# Patient Record
Sex: Female | Born: 1935
Health system: Southern US, Community
[De-identification: ages and names within clinical notes are randomized; demographics above are authoritative.]

## PROBLEM LIST (undated history)

## (undated) DIAGNOSIS — E079 Disorder of thyroid, unspecified: Secondary | ICD-10-CM

## (undated) DIAGNOSIS — I10 Essential (primary) hypertension: Secondary | ICD-10-CM

## (undated) DIAGNOSIS — I495 Sick sinus syndrome: Secondary | ICD-10-CM

## (undated) DIAGNOSIS — I509 Heart failure, unspecified: Secondary | ICD-10-CM

## (undated) HISTORY — DX: Sick sinus syndrome: I49.5

---

## 1999-11-26 ENCOUNTER — Emergency Department (HOSPITAL_COMMUNITY): Admission: EM | Admit: 1999-11-26 | Discharge: 1999-11-27 | Payer: Self-pay

## 1999-11-30 ENCOUNTER — Encounter: Payer: Self-pay | Admitting: Emergency Medicine

## 1999-11-30 ENCOUNTER — Emergency Department (HOSPITAL_COMMUNITY): Admission: EM | Admit: 1999-11-30 | Discharge: 1999-11-30 | Payer: Self-pay | Admitting: Emergency Medicine

## 1999-12-06 ENCOUNTER — Emergency Department (HOSPITAL_COMMUNITY): Admission: EM | Admit: 1999-12-06 | Discharge: 1999-12-06 | Payer: Self-pay | Admitting: Emergency Medicine

## 1999-12-06 ENCOUNTER — Encounter: Payer: Self-pay | Admitting: Emergency Medicine

## 1999-12-12 ENCOUNTER — Encounter: Payer: Self-pay | Admitting: Cardiology

## 1999-12-14 ENCOUNTER — Inpatient Hospital Stay (HOSPITAL_COMMUNITY): Admission: EM | Admit: 1999-12-14 | Discharge: 1999-12-19 | Payer: Self-pay | Admitting: Cardiology

## 1999-12-17 ENCOUNTER — Encounter: Payer: Self-pay | Admitting: Cardiology

## 2000-01-17 ENCOUNTER — Emergency Department (HOSPITAL_COMMUNITY): Admission: EM | Admit: 2000-01-17 | Discharge: 2000-01-17 | Payer: Self-pay | Admitting: Emergency Medicine

## 2000-01-23 ENCOUNTER — Inpatient Hospital Stay (HOSPITAL_COMMUNITY): Admission: EM | Admit: 2000-01-23 | Discharge: 2000-01-31 | Payer: Self-pay | Admitting: Emergency Medicine

## 2000-01-23 ENCOUNTER — Encounter: Payer: Self-pay | Admitting: Emergency Medicine

## 2000-01-26 ENCOUNTER — Encounter: Payer: Self-pay | Admitting: Cardiology

## 2000-01-29 ENCOUNTER — Encounter: Payer: Self-pay | Admitting: Cardiology

## 2000-01-30 ENCOUNTER — Encounter: Payer: Self-pay | Admitting: Cardiology

## 2000-06-17 ENCOUNTER — Ambulatory Visit (HOSPITAL_COMMUNITY): Admission: RE | Admit: 2000-06-17 | Discharge: 2000-06-17 | Payer: Self-pay | Admitting: Internal Medicine

## 2000-06-17 ENCOUNTER — Encounter: Payer: Self-pay | Admitting: Internal Medicine

## 2020-04-06 ENCOUNTER — Encounter (HOSPITAL_COMMUNITY): Payer: Self-pay | Admitting: Emergency Medicine

## 2020-04-06 ENCOUNTER — Emergency Department (HOSPITAL_COMMUNITY): Payer: Medicare Other

## 2020-04-06 ENCOUNTER — Other Ambulatory Visit: Payer: Self-pay

## 2020-04-06 ENCOUNTER — Inpatient Hospital Stay (HOSPITAL_COMMUNITY)
Admission: EM | Admit: 2020-04-06 | Discharge: 2020-04-15 | DRG: 291 | Disposition: A | Discharge status: Skilled Nursing Facility | Payer: Medicare Other | Attending: Cardiovascular Disease | Admitting: Cardiovascular Disease

## 2020-04-06 DIAGNOSIS — R001 Bradycardia, unspecified: Secondary | ICD-10-CM | POA: Diagnosis present

## 2020-04-06 DIAGNOSIS — I130 Hypertensive heart and chronic kidney disease with heart failure and stage 1 through stage 4 chronic kidney disease, or unspecified chronic kidney disease: Principal | ICD-10-CM | POA: Diagnosis present

## 2020-04-06 DIAGNOSIS — N179 Acute kidney failure, unspecified: Secondary | ICD-10-CM | POA: Diagnosis present

## 2020-04-06 DIAGNOSIS — I5023 Acute on chronic systolic (congestive) heart failure: Secondary | ICD-10-CM | POA: Diagnosis present

## 2020-04-06 DIAGNOSIS — N1832 Chronic kidney disease, stage 3b: Secondary | ICD-10-CM

## 2020-04-06 DIAGNOSIS — E039 Hypothyroidism, unspecified: Secondary | ICD-10-CM | POA: Diagnosis present

## 2020-04-06 DIAGNOSIS — I495 Sick sinus syndrome: Secondary | ICD-10-CM | POA: Diagnosis present

## 2020-04-06 DIAGNOSIS — H9190 Unspecified hearing loss, unspecified ear: Secondary | ICD-10-CM | POA: Diagnosis present

## 2020-04-06 DIAGNOSIS — I10 Essential (primary) hypertension: Secondary | ICD-10-CM | POA: Diagnosis present

## 2020-04-06 DIAGNOSIS — I472 Ventricular tachycardia: Secondary | ICD-10-CM | POA: Diagnosis present

## 2020-04-06 DIAGNOSIS — Z79899 Other long term (current) drug therapy: Secondary | ICD-10-CM

## 2020-04-06 DIAGNOSIS — F0390 Unspecified dementia without behavioral disturbance: Secondary | ICD-10-CM | POA: Diagnosis present

## 2020-04-06 DIAGNOSIS — N183 Chronic kidney disease, stage 3 unspecified: Secondary | ICD-10-CM | POA: Diagnosis present

## 2020-04-06 DIAGNOSIS — I509 Heart failure, unspecified: Secondary | ICD-10-CM

## 2020-04-06 DIAGNOSIS — E876 Hypokalemia: Secondary | ICD-10-CM

## 2020-04-06 DIAGNOSIS — Z79890 Hormone replacement therapy (postmenopausal): Secondary | ICD-10-CM | POA: Diagnosis not present

## 2020-04-06 DIAGNOSIS — R0602 Shortness of breath: Secondary | ICD-10-CM | POA: Diagnosis present

## 2020-04-06 DIAGNOSIS — Z20822 Contact with and (suspected) exposure to covid-19: Secondary | ICD-10-CM | POA: Diagnosis present

## 2020-04-06 DIAGNOSIS — M7989 Other specified soft tissue disorders: Secondary | ICD-10-CM

## 2020-04-06 HISTORY — DX: Essential (primary) hypertension: I10

## 2020-04-06 HISTORY — DX: Heart failure, unspecified: I50.9

## 2020-04-06 HISTORY — DX: Disorder of thyroid, unspecified: E07.9

## 2020-04-06 LAB — BASIC METABOLIC PANEL
Anion gap: 14 (ref 5–15)
BUN: 30 mg/dL — ABNORMAL HIGH (ref 8–23)
CO2: 23 mmol/L (ref 22–32)
Calcium: 9 mg/dL (ref 8.9–10.3)
Chloride: 104 mmol/L (ref 98–111)
Creatinine, Ser: 1.91 mg/dL — ABNORMAL HIGH (ref 0.44–1.00)
GFR calc Af Amer: 26 mL/min — ABNORMAL LOW (ref >60)
GFR calc non Af Amer: 22 mL/min — ABNORMAL LOW (ref >60)
Glucose, Bld: 110 mg/dL — ABNORMAL HIGH (ref 70–99)
Potassium: 2.9 mmol/L — ABNORMAL LOW (ref 3.5–5.1)
Sodium: 141 mmol/L (ref 135–145)

## 2020-04-06 LAB — MAGNESIUM: Magnesium: 2.6 mg/dL — ABNORMAL HIGH (ref 1.7–2.4)

## 2020-04-06 LAB — TROPONIN I (HIGH SENSITIVITY)
Troponin I (High Sensitivity): 37 ng/L — ABNORMAL HIGH (ref <18)
Troponin I (High Sensitivity): 33 ng/L — ABNORMAL HIGH (ref <18)

## 2020-04-06 LAB — HEPATIC FUNCTION PANEL
ALT: 44 U/L (ref 0–44)
AST: 123 U/L — ABNORMAL HIGH (ref 15–41)
Albumin: 3.8 g/dL (ref 3.5–5.0)
Alkaline Phosphatase: 31 U/L — ABNORMAL LOW (ref 38–126)
Bilirubin, Direct: 0.1 mg/dL (ref 0.0–0.2)
Indirect Bilirubin: 0.6 mg/dL (ref 0.3–0.9)
Total Bilirubin: 0.7 mg/dL (ref 0.3–1.2)
Total Protein: 8.2 g/dL — ABNORMAL HIGH (ref 6.5–8.1)

## 2020-04-06 LAB — CBC
HCT: 33.9 % — ABNORMAL LOW (ref 36.0–46.0)
Hemoglobin: 10.7 g/dL — ABNORMAL LOW (ref 12.0–15.0)
MCH: 29.7 pg (ref 26.0–34.0)
MCHC: 31.6 g/dL (ref 30.0–36.0)
MCV: 94.2 fL (ref 80.0–100.0)
Platelets: 204 10*3/uL (ref 150–400)
RBC: 3.6 MIL/uL — ABNORMAL LOW (ref 3.87–5.11)
RDW: 17.1 % — ABNORMAL HIGH (ref 11.5–15.5)
WBC: 4.6 10*3/uL (ref 4.0–10.5)
nRBC: 0 % (ref 0.0–0.2)

## 2020-04-06 LAB — BRAIN NATRIURETIC PEPTIDE: B Natriuretic Peptide: 70.6 pg/mL (ref 0.0–100.0)

## 2020-04-06 MED ORDER — FUROSEMIDE 10 MG/ML IJ SOLN
20.0000 mg | Freq: Two times a day (BID) | INTRAMUSCULAR | Status: DC
  Administered 2020-04-07 – 2020-04-08 (×3): 20 mg via INTRAVENOUS
  Filled 2020-04-06 (×3): qty 2

## 2020-04-06 MED ORDER — SODIUM CHLORIDE 0.9% FLUSH
3.0000 mL | Freq: Two times a day (BID) | INTRAVENOUS | Status: DC
  Administered 2020-04-06 – 2020-04-15 (×18): 3 mL via INTRAVENOUS

## 2020-04-06 MED ORDER — ENOXAPARIN SODIUM 40 MG/0.4ML ~~LOC~~ SOLN: 40.0000 mg | SUBCUTANEOUS | Status: DC

## 2020-04-06 MED ORDER — AMLODIPINE BESYLATE 10 MG PO TABS
10.0000 mg | ORAL_TABLET | Freq: Every day | ORAL | Status: DC
  Administered 2020-04-07 – 2020-04-08 (×2): 10 mg via ORAL
  Filled 2020-04-06 (×3): qty 1

## 2020-04-06 MED ORDER — POTASSIUM CHLORIDE CRYS ER 20 MEQ PO TBCR
40.0000 meq | EXTENDED_RELEASE_TABLET | Freq: Once | ORAL | Status: AC
  Administered 2020-04-06: 22:00:00 40 meq via ORAL
  Filled 2020-04-06: qty 2

## 2020-04-06 MED ORDER — ACETAMINOPHEN 325 MG PO TABS
650.0000 mg | ORAL_TABLET | ORAL | Status: DC | PRN
  Filled 2020-04-06 (×3): qty 2

## 2020-04-06 MED ORDER — SODIUM CHLORIDE 0.9% FLUSH: 3.0000 mL | INTRAVENOUS | Status: DC | PRN

## 2020-04-06 MED ORDER — SODIUM CHLORIDE 0.9 % IV SOLN: 250.0000 mL | INTRAVENOUS | Status: DC | PRN

## 2020-04-06 MED ORDER — ONDANSETRON HCL 4 MG/2ML IJ SOLN: 4.0000 mg | Freq: Four times a day (QID) | INTRAMUSCULAR | Status: DC | PRN

## 2020-04-06 MED ORDER — HYDRALAZINE HCL 25 MG PO TABS
25.0000 mg | ORAL_TABLET | Freq: Three times a day (TID) | ORAL | Status: DC
  Administered 2020-04-06 – 2020-04-11 (×16): 25 mg via ORAL
  Filled 2020-04-06 (×16): qty 1

## 2020-04-06 MED ORDER — HEPARIN SODIUM (PORCINE) 5000 UNIT/ML IJ SOLN
5000.0000 [IU] | Freq: Three times a day (TID) | INTRAMUSCULAR | Status: DC
  Administered 2020-04-06 – 2020-04-11 (×14): 5000 [IU] via SUBCUTANEOUS
  Filled 2020-04-06 (×13): qty 1

## 2020-04-06 MED ORDER — VITAMIN A 10000 UNITS PO CAPS
10000.0000 [IU] | ORAL_CAPSULE | Freq: Every day | ORAL | Status: DC
  Administered 2020-04-07 – 2020-04-15 (×8): 10000 [IU] via ORAL
  Filled 2020-04-06 (×10): qty 1

## 2020-04-06 MED ORDER — LEVOTHYROXINE SODIUM 100 MCG PO TABS
100.0000 ug | ORAL_TABLET | Freq: Every day | ORAL | Status: DC
  Administered 2020-04-07 – 2020-04-08 (×2): 100 ug via ORAL
  Filled 2020-04-06 (×2): qty 1

## 2020-04-06 MED ORDER — VITAMIN E 15 UNIT/0.3ML PO SOLN
100.0000 [IU] | Freq: Every day | ORAL | Status: DC
  Filled 2020-04-06: qty 2

## 2020-04-06 MED ORDER — FLUTICASONE PROPIONATE 50 MCG/ACT NA SUSP
1.0000 | Freq: Every day | NASAL | Status: DC | PRN
  Filled 2020-04-06: qty 16

## 2020-04-06 MED ORDER — CHOLECALCIFEROL 10 MCG (400 UNIT) PO TABS
400.0000 [IU] | ORAL_TABLET | Freq: Every day | ORAL | Status: DC
  Administered 2020-04-07 – 2020-04-15 (×8): 400 [IU] via ORAL
  Filled 2020-04-06 (×9): qty 1

## 2020-04-06 MED ORDER — POTASSIUM CHLORIDE IN NACL 20-0.9 MEQ/L-% IV SOLN
Freq: Once | INTRAVENOUS | Status: AC
  Filled 2020-04-06: qty 1000

## 2020-04-06 MED ORDER — DICLOFENAC SODIUM 1 % EX GEL
1.0000 "application " | Freq: Every day | CUTANEOUS | Status: DC | PRN
  Administered 2020-04-12 – 2020-04-13 (×2): 1 via TOPICAL
  Filled 2020-04-06 (×2): qty 100

## 2020-04-06 NOTE — ED Provider Notes (Signed)
[facility] EMERGENCY DEPARTMENT Provider Note   CSN: [id number] Arrival date & time: 04/06/20  1159     History Chief Complaint  Patient presents with  . Chest Pain    Latasha Norton is a 86 y.o. female.  HPI Patient presents with concern of lower extremity swelling.  She notes that swelling has been present possibly for a few days, though exact time of onset is unclear.  She specifically denies any pain, any nausea.  Patient is seemingly a reliable historian, but her difficulty with hearing does create some difficulty with obtaining full HPI. Patient states that she is generally well, feels strong.  It seems as though she has had no recent medication changes, and she notes that she takes her blood pressure medication and fluid pills regularly.  Today after describing lower extremity swelling to her physician she was sent here for evaluation.    Past Medical History:  Diagnosis Date  . CHF (congestive heart failure) (HCC)   . Hypertension   . Thyroid disease      History reviewed. No pertinent surgical history.   OB History   No obstetric history on file.      Social history no drinking, no smoking, has multiple children, grandchildren, great-grandchildren and great great-grandchildren. Home Medications Prior to Admission medications   Not on File    Allergies    Patient has no known allergies.  Review of Systems   Review of Systems  Constitutional:       Per HPI, otherwise negative  HENT:       Per HPI, otherwise negative  Respiratory:       Per HPI, otherwise negative  Cardiovascular:       Per HPI, otherwise negative  Gastrointestinal: Negative for vomiting.  Endocrine:       Negative aside from HPI  Genitourinary:       Neg aside from HPI   Musculoskeletal:       Per HPI, otherwise negative  Skin: Negative.   Allergic/Immunologic: Negative for immunocompromised state.  Neurological: Negative for syncope.    Physical  Exam Updated Vital Signs BP (!) 142/61 (BP Location: Right Arm)   Pulse (!) 59   Temp 97.9 F (36.6 C) (Oral)   Resp 16   SpO2 96%   Physical Exam Vitals and nursing note reviewed.  Constitutional:      Appearance: She is well-developed.     Comments: Sprightly elderly female awake and alert interacting appropriately, though with slight limitation secondary to hearing loss.  HENT:     Head: Normocephalic and atraumatic.  Eyes:     Conjunctiva/sclera: Conjunctivae normal.  Cardiovascular:     Rate and Rhythm: Normal rate and regular rhythm.  Pulmonary:     Effort: Pulmonary effort is normal. No respiratory distress.     Breath sounds: Normal breath sounds. No stridor. No decreased breath sounds or wheezing.  Abdominal:     General: There is no distension.  Musculoskeletal:     Right lower leg: Edema present.     Left lower leg: Edema present.  Skin:    General: Skin is warm and dry.  Neurological:     Mental Status: She is alert and oriented to person, place, and time.     Cranial Nerves: No cranial nerve deficit.     ED Results / Procedures / Treatments   Labs (all labs ordered are listed, but only abnormal results are displayed) Labs Reviewed  BASIC METABOLIC PANEL -  Abnormal; Notable for the following components:      Result Value   Potassium 2.9 (*)    Glucose, Bld 110 (*)    BUN 30 (*)    Creatinine, Ser 1.91 (*)    GFR calc non Af Amer 22 (*)    GFR calc Af Amer 26 (*)    All other components within normal limits  CBC - Abnormal; Notable for the following components:   RBC 3.60 (*)    Hemoglobin 10.7 (*)    HCT 33.9 (*)    RDW 17.1 (*)    All other components within normal limits  TROPONIN I (HIGH SENSITIVITY) - Abnormal; Notable for the following components:   Troponin I (High Sensitivity) 37 (*)    All other components within normal limits  HEPATIC FUNCTION PANEL  BRAIN NATRIURETIC PEPTIDE  MAGNESIUM  TROPONIN I (HIGH SENSITIVITY)    EKG EKG  Interpretation  Date/Time:  Wednesday April 06 2020 11:59:52 EDT Ventricular Rate:  72 PR Interval:    QRS Duration: 174 QT Interval:  452 QTC Calculation: 494 R Axis:   -11 Text Interpretation: juntional rhythm Non-specific intra-ventricular conduction block Left ventricular hypertrophy with repolarization abnormality ( Cornell product ) Abnormal ECG Confirmed by Carmin Muskrat [phone]) on 04/06/2020 4:42:02 PM   Radiology DG Chest 2 View  Result Date: 04/06/2020 CLINICAL DATA:  Chest pain, short of breath EXAM: CHEST - 2 VIEW COMPARISON:  None. FINDINGS: Elevation of the LEFT hemidiaphragm. Mediastinum is shifted rightward related to the diaphragmatic elevation. Heart appears enlarged. Ectatic aorta. Mild central venous congestion. No focal infiltrate. No pneumothorax. Degenerate changes of the shoulders. IMPRESSION: 1. Cardiomegaly and central venous congestion. 2. Marked elevation of the LEFT hemidiaphragm Electronically Signed   By: Suzy Bouchard M.D.   On: 04/06/2020 12:53    Procedures Procedures (including critical care time)  Medications Ordered in ED Medications  potassium chloride SA (KLOR-CON) CR tablet 40 mEq (has no administration in time range)  0.9 % NaCl with KCl 20 mEq/ L  infusion (has no administration in time range)    ED Course  I have reviewed the triage vital signs and the nursing notes.  Pertinent labs & imaging results that were available during my care of the patient were reviewed by me and considered in my medical decision making (see chart for details).   Patient presents with swelling.  Patient's initial labs are available on my initial interpretation, and she is found to have hypokalemia. Given the patient description of lower extremity swelling, history of diuretics, antihypertensives, is her suspicion for electrolyte derangement, versus heart failure exacerbation.  Initial findings less consistent with pneumonia or other infection. Patient's initial  EKG concerning for possible block, though junctional rhythm is another possibility.  Patient placed on continuous cardiac monitoring, additional labs ordered, potassium supplement oral and IV as well as fluids ordered as well.  I discussed patient's case with our cardiologist on-call, reviewed EKG together, concerning for wide QRS, sinus rhythm versus junctional escape rhythm, with the with regularity.  We discussed need for admission given the patient's hyperkalemia, as above.  6:23 PM Patient accompanied by a granddaughter and a great granddaughter.  They note the patient is interacting in a typical manner.  We discussed initial findings which are notable for abnormalities as above, with worsening renal function, hypokalemia.  The state that the patient has been doing generally well until developing swelling, speaking with her physician earlier today. Patient is aware of findings thus far.  Gust patient case with her cardiologist/internal medicine team.  With consideration as above including acute kidney injury, hypokalemia, need for ongoing repletion both with fluids and potassium and medication adjustments given her ongoing use of diuretics, the patient will require admission for further monitoring, management. Other initial findings are somewhat reassuring, without overt evidence for ACS, pneumonia, other infectious etiologies.  MDM Rules/Calculators/A&P MDM Number of Diagnoses or Management Options AKI (acute kidney injury) (HCC): new, needed workup Hypokalemia: new, needed workup Swelling of lower extremity: new, needed workup   Amount and/or Complexity of Data Reviewed Clinical lab tests: reviewed Tests in the radiology section of CPT: reviewed Tests in the medicine section of CPT: reviewed Discussion of test results with the performing providers: yes Decide to obtain previous medical records or to obtain history from someone other than the patient: yes Obtain history from someone  other than the patient: yes Review and summarize past medical records: yes Discuss the patient with other providers: yes Independent visualization of images, tracings, or specimens: yes  Risk of Complications, Morbidity, and/or Mortality Presenting problems: high Diagnostic procedures: high Management options: high  Critical Care Total time providing critical care: < 30 minutes  Patient Progress Patient progress: stable   Final Clinical Impression(s) / ED Diagnoses Final diagnoses:  Hypokalemia  AKI (acute kidney injury) (HCC)  Swelling of lower extremity     Gerhard Munch, MD 04/06/20 1829

## 2020-04-06 NOTE — H&P (Signed)
History and Physical   Latasha Norton DPO:[id number] DOB: Jul 22, 1925 DOA: 04/06/2020  Referring MD/NP/PA: Dr. Vanita Panda  PCP: Patient, No Pcp Per   Outpatient Specialists: None  Patient coming from: Home  Chief Complaint: Lower extremity swelling  HPI: Latasha Norton is a 86 y.o. female with medical history significant of hypertension, hypothyroidism, early dementia, previous diagnosis of CHF but no documented EF presenting to the ER by family secondary to progressive lower extremity swelling and some mild shortness of breath with exertion.  Patient denied any chest pain.  Denied any nausea vomiting or diarrhea denied any dizziness.  She has been on amlodipine as well as hydrochlorothiazide with hydralazine.  They do not believe the amlodipine is causing her swelling because she has been on it for too long.  Patient seen and evaluated.  Initial work-up uneventful.  No elevation of troponin and EKG is showing bradycardia otherwise no significant finding.  She is suspected to have acute exacerbation of CHF and she is being admitted to the hospital for work-up and initial treatment..  ED Course: Temperature 98 blood pressure 160/69 pulse 56 respiratory 25 oxygen sat 95%.  White count is 4.6 with hemoglobin 10.7 and platelet count of 204.  Sodium 141 potassium 2.9 chloride 104 CO2 23 BUN 30 and creatinine 1.91.  Magnesium is 2.6 AST 123 with ALT 44.  Troponin is 3733.  BNP of 70.6.  Chest x-ray showed cardiomegaly with central venous congestion with marked elevation of the left hemidiaphragm.  EKG showed junctional rhythm with left ventricular hypertrophy and a rate of 72.  Patient being admitted to the hospital for further evaluation and treatment  Review of Systems: As per HPI otherwise 10 point review of systems negative.    Past Medical History:  Diagnosis Date  . CHF (congestive heart failure) ([place])   . Hypertension   . Thyroid disease     History reviewed. No pertinent surgical  history.   has no history on file for tobacco, alcohol, and drug.  No Known Allergies  History reviewed. No pertinent family history.   Prior to Admission medications   Medication Sig Start Date End Date Taking? Authorizing Provider  amLODipine (NORVASC) 10 MG tablet Take 10 mg by mouth daily. 01/15/20  Yes [provider]  diclofenac Sodium (VOLTAREN) 1 % GEL Apply 1 application topically daily as needed for pain. 04/04/20  Yes [provider]  fluticasone (FLONASE) 50 MCG/ACT nasal spray Place 1 spray into both nostrils daily as needed for congestion. 12/25/19  Yes [provider]  hydrALAZINE (APRESOLINE) 25 MG tablet Take 25 mg by mouth 3 (three) times daily. 01/14/20  Yes [provider]  hydrochlorothiazide (MICROZIDE) 12.5 MG capsule Take 12.5 mg by mouth daily. 02/03/20  Yes [provider]  levothyroxine (SYNTHROID) 100 MCG tablet Take 100 mcg by mouth daily. 04/04/20  Yes [provider]  VITAMIN A PO Take 1 tablet by mouth daily.    Yes [provider]  VITAMIN D PO Take 1 tablet by mouth daily.    Yes [provider]  VITAMIN E PO Take 1 tablet by mouth daily.    Yes [provider]    Physical Exam: Vitals:   04/06/20 1800 04/06/20 1815 04/06/20 1830 04/06/20 1850  BP: (!) 147/68 (!) 151/72 (!) 157/69   Pulse: (!) 56 (!) 58 (!) 58   Resp: 12 (!) 25 19   Temp:      TempSrc:      SpO2: 97%  96% 97%   Weight:    72.6 kg  Height:    5\' 6"  (1.676 m)      Constitutional: Pleasant, mildly confused, no acute distress Vitals:   04/06/20 1800 04/06/20 1815 04/06/20 1830 04/06/20 1850  BP: (!) 147/68 (!) 151/72 (!) 157/69   Pulse: (!) 56 (!) 58 (!) 58   Resp: 12 (!) 25 19   Temp:      TempSrc:      SpO2: 97% 96% 97%   Weight:    72.6 kg  Height:    5\' 6"  (1.676 m)   Eyes: PERRL, lids and conjunctivae normal ENMT: Mucous membranes are moist. Posterior pharynx clear of any exudate or  lesions.edentulous Neck: normal, supple, no masses, no thyromegaly Respiratory: Diffuse bilateral crackles, no significant wheezing. Normal respiratory effort. No accessory muscle use.  Cardiovascular: Sinus bradycardia, no murmurs / rubs / gallops.  1+ edema bilaterally. 2+ pedal pulses. No carotid bruits.  Abdomen: no tenderness, no masses palpated. No hepatosplenomegaly. Bowel sounds positive.  Musculoskeletal: no clubbing / cyanosis. No joint deformity upper and lower extremities. Good ROM, no contractures. Normal muscle tone.  Skin: no rashes, lesions, ulcers. No induration Neurologic: CN 2-12 grossly intact. Sensation intact, DTR normal. Strength 5/5 in all 4.  Psychiatric: Mild confusion alert and oriented x 3. Normal mood.     Labs on Admission: I have personally reviewed following labs and imaging studies  CBC: Recent Labs  Lab 04/06/20 1213  WBC 4.6  HGB 10.7*  HCT 33.9*  MCV 94.2  PLT 204   Basic Metabolic Panel: Recent Labs  Lab 04/06/20 1213 04/06/20 1653  NA 141  --   K 2.9*  --   CL 104  --   CO2 23  --   GLUCOSE 110*  --   BUN 30*  --   CREATININE 1.91*  --   CALCIUM 9.0  --   MG  --  2.6*   GFR: Estimated Creatinine Clearance: 19.2 mL/min (A) (by C-G formula based on SCr of 1.91 mg/dL (H)). Liver Function Tests: Recent Labs  Lab 04/06/20 1653  AST 123*  ALT 44  ALKPHOS 31*  BILITOT 0.7  PROT 8.2*  ALBUMIN 3.8   No results for input(s): LIPASE, AMYLASE in the last 168 hours. No results for input(s): AMMONIA in the last 168 hours. Coagulation Profile: No results for input(s): INR, PROTIME in the last 168 hours. Cardiac Enzymes: No results for input(s): CKTOTAL, CKMB, CKMBINDEX, TROPONINI in the last 168 hours. BNP (last 3 results) No results for input(s): PROBNP in the last 8760 hours. HbA1C: No results for input(s): HGBA1C in the last 72 hours. CBG: No results for input(s): GLUCAP in the last 168 hours. Lipid Profile: No results for  input(s): CHOL, HDL, LDLCALC, TRIG, CHOLHDL, LDLDIRECT in the last 72 hours. Thyroid Function Tests: No results for input(s): TSH, T4TOTAL, FREET4, T3FREE, THYROIDAB in the last 72 hours. Anemia Panel: No results for input(s): VITAMINB12, FOLATE, FERRITIN, TIBC, IRON, RETICCTPCT in the last 72 hours. Urine analysis: No results found for: COLORURINE, APPEARANCEUR, LABSPEC, PHURINE, GLUCOSEU, HGBUR, BILIRUBINUR, KETONESUR, PROTEINUR, UROBILINOGEN, NITRITE, LEUKOCYTESUR Sepsis Labs: @LABRCNTIP (procalcitonin:4,lacticidven:4) )No results found for this or any previous visit (from the past 240 hour(s)).   Radiological Exams on Admission: DG Chest 2 View  Result Date: 04/06/2020 CLINICAL DATA:  Chest pain, short of breath EXAM: CHEST - 2 VIEW COMPARISON:  None. FINDINGS: Elevation of the LEFT hemidiaphragm. Mediastinum is shifted rightward related to the diaphragmatic elevation.  Heart appears enlarged. Ectatic aorta. Mild central venous congestion. No focal infiltrate. No pneumothorax. Degenerate changes of the shoulders. IMPRESSION: 1. Cardiomegaly and central venous congestion. 2. Marked elevation of the LEFT hemidiaphragm Electronically Signed   By: Genevive Bi M.D.   On: 04/06/2020 12:53    EKG: Independently reviewed.  Reviewed as above  Assessment/Plan Principal Problem:   Acute exacerbation of CHF (congestive heart failure) (HCC) Active Problems:   Hypokalemia   Benign essential HTN   Hypothyroid   CKD (chronic kidney disease), stage III   Bradycardia    #1 acute exacerbation of CHF: Patient will be admitted and initiated on IV diuresis.  Hold hydrochlorothiazide.  We will get echocardiogram.  Cardiology consult after echocardiogram.  Monitor closely.  #2 sinus bradycardia: Appears to be transient.  Currently on amlodipine.  Will hold amlodipine both for her edema as well as bradycardia.  Continue hydralazine.  May consider beta-blockers instead.  #3 hypokalemia: Replete  potassium  #4 benign essential hypertension: Continue blood pressure control.  #5 hypothyroidism: Continue levothyroxine  #6 chronic kidney disease stage III: Monitor renal function as we hydrate.    DVT prophylaxis: Lovenox Code Status: Full code Family Communication: Granddaughter and great granddaughter Disposition Plan: Home Consults called: Cardiology Admission status: Inpatient  Severity of Illness: The appropriate patient status for this patient is INPATIENT. Inpatient status is judged to be reasonable and necessary in order to provide the required intensity of service to ensure the patient's safety. The patient's presenting symptoms, physical exam findings, and initial radiographic and laboratory data in the context of their chronic comorbidities is felt to place them at high risk for further clinical deterioration. Furthermore, it is not anticipated that the patient will be medically stable for discharge from the hospital within 2 midnights of admission. The following factors support the patient status of inpatient.   " The patient's presenting symptoms include lower extremity edema with shortness of breath. " The worrisome physical exam findings include bilateral lower extremity edema with bradycardia. " The initial radiographic and laboratory data are worrisome because of bilateral vascular congestion. " The chronic co-morbidities include hypertension CHF.   * I certify that at the point of admission it is my clinical judgment that the patient will require inpatient hospital care spanning beyond 2 midnights from the point of admission due to high intensity of service, high risk for further deterioration and high frequency of surveillance required.Lonia Blood MD [facility] Pager [phone]  If 7PM-7AM, please contact night-coverage [email or web address] Password [facility]  04/06/2020, 7:19 PM

## 2020-04-06 NOTE — ED Triage Notes (Signed)
Patient sent by Dr saying she had fluid around her heart and on her legs. When asked if patient has chest pain she answers "I reckon." Son states patient has not complained of any chest pain but was sent by PCP.

## 2020-04-06 NOTE — Consult Note (Signed)
Referring Physician:  KEIA RASK is an 86 y.o. female.                       Chief Complaint: Shortness of breath  HPI: 86 years old black female with PMH of hypertension, hypothyroidism following h/o hyperthyroidism treated with radioactive I-131 in 05/2000 and CHF has lower extremities swelling and shortness of breath with activity. Her BNP is normal but her potassium is low and creatinine is elevated. Her HS troponin I are minimally elevated. Her chest x-ray is suggestive of vascular congestion and left hemidiaphragm marked elevation. She is maintaining 97 % O2 saturation on 2 L/min of oxygen by [place].   Past Medical History:  Diagnosis Date  . CHF (congestive heart failure) (HCC)   . Hypertension   . Thyroid disease       History reviewed. No pertinent surgical history.  History reviewed. No pertinent family history. Social History:  has no history on file for tobacco, alcohol, and drug.  Allergies: No Known Allergies  (Not in a hospital admission)   Results for orders placed or performed during the hospital encounter of 04/06/20 (from the past 48 hour(s))  Basic metabolic panel     Status: Abnormal   Collection Time: 04/06/20 12:13 PM  Result Value Ref Range   Sodium 141 135 - 145 mmol/L   Potassium 2.9 (L) 3.5 - 5.1 mmol/L   Chloride 104 98 - 111 mmol/L   CO2 23 22 - 32 mmol/L   Glucose, Bld 110 (H) 70 - 99 mg/dL    Comment: Glucose reference range applies only to samples taken after fasting for at least 8 hours.   BUN 30 (H) 8 - 23 mg/dL   Creatinine, Ser 6.96 (H) 0.44 - 1.00 mg/dL   Calcium 9.0 8.9 - 29.5 mg/dL   GFR calc non Af Amer 22 (L) >60 mL/min   GFR calc Af Amer 26 (L) >60 mL/min   Anion gap 14 5 - 15    Comment: Performed at [facility], [address], [place], [place] [address]  CBC     Status: Abnormal   Collection Time: 04/06/20 12:13 PM  Result Value Ref Range   WBC 4.6 4.0 - 10.5 K/uL   RBC 3.60 (L) 3.87 - 5.11 MIL/uL   Hemoglobin 10.7 (L)  12.0 - 15.0 g/dL   HCT 24.4 (L) 01.0 - 27.2 %   MCV 94.2 80.0 - 100.0 fL   MCH 29.7 26.0 - 34.0 pg   MCHC 31.6 30.0 - 36.0 g/dL   RDW 53.6 (H) 64.4 - 03.4 %   Platelets 204 150 - 400 K/uL   nRBC 0.0 0.0 - 0.2 %    Comment: Performed at [facility], [address], [place], [place] [address]  Troponin I (High Sensitivity)     Status: Abnormal   Collection Time: 04/06/20 12:13 PM  Result Value Ref Range   Troponin I (High Sensitivity) 37 (H) <18 ng/L    Comment: (NOTE) Elevated high sensitivity troponin I (hsTnI) values and significant  changes across serial measurements may suggest ACS but many other  chronic and acute conditions are known to elevate hsTnI results.  Refer to the "Links" section for chest pain algorithms and additional  guidance. Performed at [facility], [address] [place], [place] [address]   Hepatic function panel     Status: Abnormal   Collection Time: 04/06/20  4:53 PM  Result  Value Ref Range   Total Protein 8.2 (H) 6.5 - 8.1 g/dL   Albumin 3.8 3.5 - 5.0 g/dL   AST 903 (H) 15 - 41 U/L   ALT 44 0 - 44 U/L   Alkaline Phosphatase 31 (L) 38 - 126 U/L   Total Bilirubin 0.7 0.3 - 1.2 mg/dL   Bilirubin, Direct 0.1 0.0 - 0.2 mg/dL   Indirect Bilirubin 0.6 0.3 - 0.9 mg/dL    Comment: Performed at [facility], [address], [place], [place] [address]  Brain natriuretic peptide     Status: None   Collection Time: 04/06/20  4:53 PM  Result Value Ref Range   B Natriuretic Peptide 70.6 0.0 - 100.0 pg/mL    Comment: Performed at [facility], [address], [place], [place] [address]  Magnesium     Status: Abnormal   Collection Time: 04/06/20  4:53 PM  Result Value Ref Range   Magnesium 2.6 (H) 1.7 - 2.4 mg/dL    Comment: Performed at [facility], [address], [place], [place] [address]  Troponin I (High Sensitivity)     Status: Abnormal   Collection Time: 04/06/20  4:53 PM  Result Value Ref Range   Troponin I (High  Sensitivity) 33 (H) <18 ng/L    Comment: (NOTE) Elevated high sensitivity troponin I (hsTnI) values and significant  changes across serial measurements may suggest ACS but many other  chronic and acute conditions are known to elevate hsTnI results.  Refer to the "Links" section for chest pain algorithms and additional  guidance. Performed at [facility], [address] [place], [place] [address]    DG Chest 2 View  Result Date: 04/06/2020 CLINICAL DATA:  Chest pain, short of breath EXAM: CHEST - 2 VIEW COMPARISON:  None. FINDINGS: Elevation of the LEFT hemidiaphragm. Mediastinum is shifted rightward related to the diaphragmatic elevation. Heart appears enlarged. Ectatic aorta. Mild central venous congestion. No focal infiltrate. No pneumothorax. Degenerate changes of the shoulders. IMPRESSION: 1. Cardiomegaly and central venous congestion. 2. Marked elevation of the LEFT hemidiaphragm Electronically Signed   By: Genevive Bi M.D.   On: 04/06/2020 12:53    Review Of Systems Constitutional: No fever, chills, weight loss or gain. Eyes: No vision change, wears glasses. No discharge or pain. Ears: Positive hearing loss, No tinnitus. Respiratory: No asthma, COPD, pneumonias. Positive shortness of breath. No hemoptysis. Cardiovascular: positive chest pain, palpitation, positive leg edema. Gastrointestinal: No nausea, vomiting, diarrhea, constipation. No GI bleed. No hepatitis. Genitourinary: No dysuria, hematuria, kidney stone. No incontinance. Neurological: No headache, stroke, seizures.  Psychiatry: No psych facility admission for anxiety, depression, suicide. No detox. Skin: No rash. Musculoskeletal: Positive joint pain, fibromyalgia, neck pain, back pain. Lymphadenopathy: No lymphadenopathy. Hematology: No anemia or easy bruising.   Blood pressure (!) 157/69, pulse (!) 58, temperature 97.9 F (36.6 C), temperature source Oral, resp. rate 19, height 5\' 6"  (1.676 m), weight  72.6 kg, SpO2 97 %. Body mass index is 25.82 kg/m. General appearance: alert, cooperative, appears stated age and moderate respiratory distress Head: Normocephalic, atraumatic. Eyes: Brown eyes, Pale pink conjunctiva, corneas clear. PERRL, EOM's intact. Neck: No adenopathy, no carotid bruit, + JVD at 0 degree angle, supple, symmetrical, trachea midline and thyroid not enlarged. Resp: Right sided basal crackles, decreased breath sounds on left lower lob area to auscultation. Cardio: Regular rate and rhythm, S1, S2 normal, II/VI systolic murmur, no click, rub or gallop GI: Soft, non-tender; bowel  sounds normal; no organomegaly. Extremities: 2 + ankle edema, no cyanosis or clubbing. Skin: Warm and dry.  Neurologic: Alert and oriented X 2, normal strength. Normal coordination and slow gait.  Assessment/Plan Acute systolic left heart failure Hypertension Hypothyroidism Hypokalemia  Agree with gentle diuresis. Potassium supplementation. Echocardiogram for LV function. Medical treatment as much as possible.  Time spent: Review of old records, Lab, x-rays, EKG, other cardiac tests, examination, discussion with patient, daughter Pamala Hurry and admitting physician over 70 minutes.  Birdie Riddle, MD  04/06/2020, 7:28 PM

## 2020-04-07 ENCOUNTER — Inpatient Hospital Stay (HOSPITAL_COMMUNITY): Payer: Medicare Other

## 2020-04-07 LAB — LIPID PANEL
Cholesterol: 298 mg/dL — ABNORMAL HIGH (ref 0–200)
HDL: 83 mg/dL (ref >40)
LDL Cholesterol: 197 mg/dL — ABNORMAL HIGH (ref 0–99)
Total CHOL/HDL Ratio: 3.6 RATIO
Triglycerides: 89 mg/dL (ref <150)
VLDL: 18 mg/dL (ref 0–40)

## 2020-04-07 LAB — BASIC METABOLIC PANEL
Anion gap: 10 (ref 5–15)
BUN: 25 mg/dL — ABNORMAL HIGH (ref 8–23)
CO2: 22 mmol/L (ref 22–32)
Calcium: 8.6 mg/dL — ABNORMAL LOW (ref 8.9–10.3)
Chloride: 111 mmol/L (ref 98–111)
Creatinine, Ser: 1.53 mg/dL — ABNORMAL HIGH (ref 0.44–1.00)
GFR calc Af Amer: 34 mL/min — ABNORMAL LOW (ref >60)
GFR calc non Af Amer: 29 mL/min — ABNORMAL LOW (ref >60)
Glucose, Bld: 100 mg/dL — ABNORMAL HIGH (ref 70–99)
Potassium: 3.4 mmol/L — ABNORMAL LOW (ref 3.5–5.1)
Sodium: 143 mmol/L (ref 135–145)

## 2020-04-07 LAB — ECHOCARDIOGRAM COMPLETE
Height: 66 in
Weight: 2144 oz

## 2020-04-07 LAB — TSH: TSH: 137.224 u[IU]/mL — ABNORMAL HIGH (ref 0.350–4.500)

## 2020-04-07 LAB — SARS CORONAVIRUS 2 (TAT 6-24 HRS): SARS Coronavirus 2: NEGATIVE

## 2020-04-07 MED ORDER — ACETAMINOPHEN 325 MG PO TABS
650.0000 mg | ORAL_TABLET | Freq: Every evening | ORAL | Status: DC | PRN
  Administered 2020-04-08 – 2020-04-14 (×5): 650 mg via ORAL
  Filled 2020-04-07 (×2): qty 2

## 2020-04-07 MED ORDER — VITAMIN E 45 MG (100 UNIT) PO CAPS
100.0000 [IU] | ORAL_CAPSULE | Freq: Every day | ORAL | Status: DC
  Administered 2020-04-07 – 2020-04-15 (×9): 100 [IU] via ORAL
  Filled 2020-04-07 (×9): qty 1

## 2020-04-07 MED ORDER — DIPHENHYDRAMINE HCL 25 MG PO CAPS
25.0000 mg | ORAL_CAPSULE | Freq: Every evening | ORAL | Status: DC | PRN
  Administered 2020-04-08 – 2020-04-14 (×4): 25 mg via ORAL
  Filled 2020-04-07 (×4): qty 1

## 2020-04-07 MED ORDER — POTASSIUM CHLORIDE CRYS ER 10 MEQ PO TBCR
10.0000 meq | EXTENDED_RELEASE_TABLET | Freq: Three times a day (TID) | ORAL | Status: DC
  Administered 2020-04-07 – 2020-04-09 (×9): 10 meq via ORAL
  Filled 2020-04-07 (×9): qty 1

## 2020-04-07 NOTE — Progress Notes (Signed)
  Echocardiogram 2D Echocardiogram has been performed.  Leta Jungling M 04/07/2020, 10:28 AM

## 2020-04-07 NOTE — Progress Notes (Signed)
Patient had an episode in which HR sustained in the low 30s.  Patient has been SB all night in the 50s, now sustaining in the 40s.  MD has been made aware of this.

## 2020-04-07 NOTE — Progress Notes (Signed)
Ref: Patient, No Pcp Per   Subjective:  Awake. No chest pain. Eating breakfast.  VS stable except junctional rhythm. HR in 40's. BP 143/69. Potassium improving to 3.4 meq from 2.9 meq. Markedly elevated TSH coincides with daughter's report of patient not taking levothyroxine.  Objective:  Vital Signs in the last 24 hours: Temp:  [97.9 F (36.6 C)-98.7 F (37.1 C)] 98.3 F (36.8 C) (04/08 0507) Pulse Rate:  [42-71] 42 (04/08 0507) Cardiac Rhythm: Junctional rhythm;Bundle branch block (04/08 0700) Resp:  [12-25] 23 (04/08 0907) BP: (126-167)/(58-72) 143/69 (04/08 0907) SpO2:  [95 %-100 %] 100 % (04/08 0507) Weight:  [60.8 kg-72.6 kg] 60.8 kg (04/08 0507)  Physical Exam: BP Readings from Last 1 Encounters:  04/07/20 (!) 143/69     Wt Readings from Last 1 Encounters:  04/07/20 60.8 kg    Weight change:  Body mass index is 21.63 kg/m. HEENT: [place]/AT, Eyes-Brown, PERL, EOMI, Conjunctiva-Pink, Sclera-Non-icteric Neck: No JVD, No bruit, Trachea midline. Lungs:  Clearing, Bilateral. Cardiac:  Regular rhythm, normal S1 and S2, no S3. II/VI systolic murmur. Abdomen:  Soft, non-tender. BS present. Extremities:  1 + ankle edema present. No cyanosis. No clubbing. CNS: AxOx2, Cranial nerves grossly intact, moves all 4 extremities.  Skin: Warm and dry.   Intake/Output from previous day: 04/07 0701 - 04/08 0700 In: 483 [P.O.:480; I.V.:3] Out: 600 [Urine:600]    Lab Results: BMET    Component Value Date/Time   NA 143 04/07/2020 0502   NA 141 04/06/2020 1213   K 3.4 (L) 04/07/2020 0502   K 2.9 (L) 04/06/2020 1213   CL 111 04/07/2020 0502   CL 104 04/06/2020 1213   CO2 22 04/07/2020 0502   CO2 23 04/06/2020 1213   GLUCOSE 100 (H) 04/07/2020 0502   GLUCOSE 110 (H) 04/06/2020 1213   BUN 25 (H) 04/07/2020 0502   BUN 30 (H) 04/06/2020 1213   CREATININE 1.53 (H) 04/07/2020 0502   CREATININE 1.91 (H) 04/06/2020 1213   CALCIUM 8.6 (L) 04/07/2020 0502   CALCIUM 9.0 04/06/2020 1213    GFRNONAA 29 (L) 04/07/2020 0502   GFRNONAA 22 (L) 04/06/2020 1213   GFRAA 34 (L) 04/07/2020 0502   GFRAA 26 (L) 04/06/2020 1213   CBC    Component Value Date/Time   WBC 4.6 04/06/2020 1213   RBC 3.60 (L) 04/06/2020 1213   HGB 10.7 (L) 04/06/2020 1213   HCT 33.9 (L) 04/06/2020 1213   PLT 204 04/06/2020 1213   MCV 94.2 04/06/2020 1213   MCH 29.7 04/06/2020 1213   MCHC 31.6 04/06/2020 1213   RDW 17.1 (H) 04/06/2020 1213   HEPATIC Function Panel Recent Labs    04/06/20 1653  PROT 8.2*   HEMOGLOBIN A1C No components found for: HGA1C,  MPG CARDIAC ENZYMES No results found for: CKTOTAL, CKMB, CKMBINDEX, TROPONINI BNP No results for input(s): PROBNP in the last 8760 hours. TSH Recent Labs    04/07/20 0502  TSH 137.224*   CHOLESTEROL Recent Labs    04/07/20 0502  CHOL 298*    Scheduled Meds: . amLODipine  10 mg Oral Daily  . cholecalciferol  400 Units Oral Daily  . furosemide  20 mg Intravenous BID  . heparin injection (subcutaneous)  5,000 Units Subcutaneous Q8H  . hydrALAZINE  25 mg Oral TID  . levothyroxine  100 mcg Oral Daily  . potassium chloride  10 mEq Oral TID  . sodium chloride flush  3 mL Intravenous Q12H  . vitamin A  10,000 Units Oral  Daily  . vitamin E  100 Units Oral Daily   Continuous Infusions: . sodium chloride     PRN Meds:.sodium chloride, acetaminophen, diclofenac Sodium, fluticasone, ondansetron (ZOFRAN) IV, sodium chloride flush  Assessment/Plan: Acute systolic left heart failure Hypertension Hypothyroidism Hypokalemia  Levothyroxine has been resumed. Echocardiogram is pending. Heart failure may have been worsened by hypothyroidysm and badycardia Will recheck TSH in one month to adjust dose. Dr. Sharyn Lull covering over the weekend.   LOS: 1 day   Time spent including chart review, lab review, examination, discussion with patient : 30 min   Orpah Cobb  MD  04/07/2020, 9:22 AM

## 2020-04-08 ENCOUNTER — Encounter (HOSPITAL_COMMUNITY): Payer: Self-pay | Admitting: Internal Medicine

## 2020-04-08 LAB — BASIC METABOLIC PANEL
Anion gap: 11 (ref 5–15)
BUN: 22 mg/dL (ref 8–23)
CO2: 25 mmol/L (ref 22–32)
Calcium: 8.5 mg/dL — ABNORMAL LOW (ref 8.9–10.3)
Chloride: 106 mmol/L (ref 98–111)
Creatinine, Ser: 1.59 mg/dL — ABNORMAL HIGH (ref 0.44–1.00)
GFR calc Af Amer: 32 mL/min — ABNORMAL LOW (ref >60)
GFR calc non Af Amer: 28 mL/min — ABNORMAL LOW (ref >60)
Glucose, Bld: 81 mg/dL (ref 70–99)
Potassium: 3.5 mmol/L (ref 3.5–5.1)
Sodium: 142 mmol/L (ref 135–145)

## 2020-04-08 MED ORDER — FUROSEMIDE 20 MG PO TABS
20.0000 mg | ORAL_TABLET | Freq: Every day | ORAL | Status: DC
  Administered 2020-04-09 – 2020-04-15 (×7): 20 mg via ORAL
  Filled 2020-04-08 (×7): qty 1

## 2020-04-08 MED ORDER — LEVOTHYROXINE SODIUM 25 MCG PO TABS
125.0000 ug | ORAL_TABLET | Freq: Every day | ORAL | Status: DC
  Administered 2020-04-09 – 2020-04-12 (×4): 125 ug via ORAL
  Filled 2020-04-08 (×4): qty 1

## 2020-04-08 NOTE — Progress Notes (Signed)
Pt having intermittent runs of non-sustained v-tach, Dr Algie Coffer notified, will continue to monitor closely.

## 2020-04-08 NOTE — Progress Notes (Signed)
Subjective:  Denies any chest pain or shortness of breath.  Still has marked sinus bradycardia on the monitor.  Objective:  Vital Signs in the last 24 hours: Temp:  [98.1 F (36.7 C)-98.6 F (37 C)] 98.1 F (36.7 C) (04/09 0004) Pulse Rate:  [45-63] 45 (04/09 0004) Resp:  [18] 18 (04/08 1727) BP: (121-144)/(52-96) 144/53 (04/09 0004) SpO2:  [92 %-98 %] 98 % (04/09 0004) Weight:  [60.8 kg] 60.8 kg (04/09 0619)  Intake/Output from previous day: 04/08 0701 - 04/09 0700 In: 560 [P.O.:560] Out: 600 [Urine:600] Intake/Output from this shift: No intake/output data recorded.  Physical Exam: Neck: no adenopathy, no carotid bruit, no JVD and supple, symmetrical, trachea midline Lungs: clear to auscultation bilaterally Heart: bradycardic.  Soft systolic murmur and paradoxical splitting of second heart sound noted Abdomen: soft, non-tender; bowel sounds normal; no masses,  no organomegaly Extremities: no clubbing, cyanosis.  Trace edema noted  Lab Results: Recent Labs    04/06/20 1213  WBC 4.6  HGB 10.7*  PLT 204   Recent Labs    04/07/20 0502 04/08/20 0342  NA 143 142  K 3.4* 3.5  CL 111 106  CO2 22 25  GLUCOSE 100* 81  BUN 25* 22  CREATININE 1.53* 1.59*   No results for input(s): TROPONINI in the last 72 hours.  Invalid input(s): CK, MB Hepatic Function Panel Recent Labs    04/06/20 1653  PROT 8.2*  ALBUMIN 3.8  AST 123*  ALT 44  ALKPHOS 31*  BILITOT 0.7  BILIDIR 0.1  IBILI 0.6   Recent Labs    04/07/20 0502  CHOL 298*   No results for input(s): PROTIME in the last 72 hours.  Imaging: Imaging results have been reviewed and DG Chest 2 View  Result Date: 04/06/2020 CLINICAL DATA:  Chest pain, short of breath EXAM: CHEST - 2 VIEW COMPARISON:  None. FINDINGS: Elevation of the LEFT hemidiaphragm. Mediastinum is shifted rightward related to the diaphragmatic elevation. Heart appears enlarged. Ectatic aorta. Mild central venous congestion. No focal infiltrate.  No pneumothorax. Degenerate changes of the shoulders. IMPRESSION: 1. Cardiomegaly and central venous congestion. 2. Marked elevation of the LEFT hemidiaphragm Electronically Signed   By: Genevive Bi M.D.   On: 04/06/2020 12:53   ECHOCARDIOGRAM COMPLETE  Result Date: 04/07/2020    ECHOCARDIOGRAM REPORT   Patient Name:   Latasha Norton Date of Exam: 04/07/2020 Medical Rec #:  [id number]      Height:       66.0 in Accession #:    [id number]     Weight:       134.0 lb Date of Birth:  08-26-1925      BSA:          1.687 m Patient Age:    86 years       BP:           143/69 mmHg Patient Gender: F              HR:           58 bpm. Exam Location:  Inpatient Procedure: 2D Echo Indications:     CHF-Acute Diastolic 428.31 / I50.31  History:         Patient has no prior history of Echocardiogram examinations.                  Signs/Symptoms:dementia; Risk Factors:Hypertension.                  Hypothyroidism.  Sonographer:     Darlina Sicilian RDCS Referring Phys:  Ong Diagnosing Phys: Dixie Dials MD IMPRESSIONS  1. Left ventricular ejection fraction, by estimation, is 45 to 50%. The left ventricle has mildly decreased function. The left ventricle demonstrates regional wall motion abnormalities (see scoring diagram/findings for description). There is mild concentric left ventricular hypertrophy of the anterior and posterior segments. Left ventricular diastolic parameters are indeterminate. There is mild hypokinesis of the left ventricular, entire septal wall.  2. Right ventricular systolic function is normal. The right ventricular size is normal. There is normal pulmonary artery systolic pressure.  3. Left atrial size was mildly dilated.  4. Right atrial size was mildly dilated.  5. The mitral valve is degenerative. Mild mitral valve regurgitation.  6. The aortic valve is tricuspid. Aortic valve regurgitation is mild. Mild to moderate aortic valve stenosis.  7. There is mild (Grade II) atheroma plaque  involving the aortic root and ascending aorta.  8. The inferior vena cava is normal in size with greater than 50% respiratory variability, suggesting right atrial pressure of 3 mmHg. FINDINGS  Left Ventricle: Left ventricular ejection fraction, by estimation, is 45 to 50%. The left ventricle has mildly decreased function. The left ventricle demonstrates regional wall motion abnormalities. Mild hypokinesis of the left ventricular, entire septal wall. The left ventricular internal cavity size was normal in size. There is mild concentric left ventricular hypertrophy of the anterior and posterior segments. Left ventricular diastolic parameters are indeterminate.  LV Wall Scoring: The entire septum is hypokinetic. The entire anterior wall, entire lateral wall, entire inferior wall, and apex are normal. Right Ventricle: The right ventricular size is normal. No increase in right ventricular wall thickness. Right ventricular systolic function is normal. There is normal pulmonary artery systolic pressure. The tricuspid regurgitant velocity is 2.04 m/s, and  with an assumed right atrial pressure of 3 mmHg, the estimated right ventricular systolic pressure is 79.8 mmHg. Left Atrium: Left atrial size was mildly dilated. Right Atrium: Right atrial size was mildly dilated. Pericardium: There is no evidence of pericardial effusion. Mitral Valve: The mitral valve is degenerative in appearance. There is mild thickening of the mitral valve leaflet(s). There is moderate calcification of the mitral valve leaflet(s). Mild to moderate mitral annular calcification. Mild mitral valve regurgitation. Tricuspid Valve: The tricuspid valve is normal in structure. Tricuspid valve regurgitation is mild. Aortic Valve: The aortic valve is tricuspid. . There is mild thickening and mild calcification of the aortic valve. Aortic valve regurgitation is mild. Aortic regurgitation PHT measures 789 msec. Mild to moderate aortic stenosis is present. Mild  aortic valve annular calcification. There is mild thickening of the aortic valve. There is mild calcification of the aortic valve. Pulmonic Valve: The pulmonic valve was normal in structure. Pulmonic valve regurgitation is trivial. Aorta: The aortic root is normal in size and structure. There is mild (Grade II) atheroma plaque involving the aortic root and ascending aorta. Venous: The inferior vena cava is normal in size with greater than 50% respiratory variability, suggesting right atrial pressure of 3 mmHg. IAS/Shunts: No atrial level shunt detected by color flow Doppler.  LEFT VENTRICLE PLAX 2D LVIDd:         3.60 cm  Diastology LVIDs:         3.10 cm  LV e' lateral:   4.39 cm/s LV PW:         1.40 cm  LV E/e' lateral: 11.5 LV IVS:  1.50 cm  LV e' medial:    4.28 cm/s LVOT diam:     1.80 cm  LV E/e' medial:  11.8 LV SV:         48 LV SV Index:   29 LVOT Area:     2.54 cm  LEFT ATRIUM         Index LA diam:    3.30 cm 1.96 cm/m  AORTIC VALVE LVOT Vmax:   80.90 cm/s LVOT Vmean:  54.700 cm/s LVOT VTI:    0.189 m AI PHT:      789 msec  AORTA Ao Asc diam: 2.90 cm MITRAL VALVE               TRICUSPID VALVE MV Area (PHT): 3.51 cm    TR Peak grad:   16.6 mmHg MV Decel Time: 216 msec    TR Vmax:        204.00 cm/s MV E velocity: 50.35 cm/s MV A velocity: 28.70 cm/s  SHUNTS MV E/A ratio:  1.75        Systemic VTI:  0.19 m                            Systemic Diam: 1.80 cm Orpah Cobb MD Electronically signed by Orpah Cobb MD Signature Date/Time: 04/07/2020/12:24:40 PM    Final     Cardiac Studies:  Assessment/Plan:  Compensated systolic congestive heart failure. Hypertension. Hypothyroidism. Marked sinus bradycardia secondary to above. Status post hypokalemia. Plan Increase Synthroid as per orders Increase ambulation as tolerated.   LOS: 2 days    Rinaldo Cloud 04/08/2020, 11:20 AM

## 2020-04-09 LAB — BASIC METABOLIC PANEL
Anion gap: 10 (ref 5–15)
BUN: 26 mg/dL — ABNORMAL HIGH (ref 8–23)
CO2: 26 mmol/L (ref 22–32)
Calcium: 8.8 mg/dL — ABNORMAL LOW (ref 8.9–10.3)
Chloride: 105 mmol/L (ref 98–111)
Creatinine, Ser: 1.77 mg/dL — ABNORMAL HIGH (ref 0.44–1.00)
GFR calc Af Amer: 28 mL/min — ABNORMAL LOW (ref >60)
GFR calc non Af Amer: 25 mL/min — ABNORMAL LOW (ref >60)
Glucose, Bld: 86 mg/dL (ref 70–99)
Potassium: 3.8 mmol/L (ref 3.5–5.1)
Sodium: 141 mmol/L (ref 135–145)

## 2020-04-09 MED ORDER — NIFEDIPINE ER OSMOTIC RELEASE 30 MG PO TB24
30.0000 mg | ORAL_TABLET | Freq: Every day | ORAL | Status: DC
  Administered 2020-04-09 – 2020-04-11 (×3): 30 mg via ORAL
  Filled 2020-04-09 (×4): qty 1

## 2020-04-09 NOTE — Progress Notes (Signed)
Nurse [facility] informed me of the pt's VS and MEWs score. Pt is A&OX2 and in NAD. Pt denied, CP and SOB. Pt HR is 39 which isn't an acute change. Pt's condition is stable.

## 2020-04-09 NOTE — Progress Notes (Signed)
Subjective:  Patient denies any complaints.  Remains in junctional rhythm, heart rate in high 30s to low 40s, occasional episodes of nonsustained VT on the monitor, asymptomatic.  Denies any dizziness, chest pain or shortness of breath  Objective:  Vital Signs in the last 24 hours: Temp:  [98 F (36.7 C)-99.1 F (37.3 C)] 98.3 F (36.8 C) (04/10 0351) Pulse Rate:  [37-41] 41 (04/10 0351) Resp:  [15-16] 16 (04/10 0351) BP: (116-136)/(42-48) 136/42 (04/10 0351) SpO2:  [97 %] 97 % (04/10 0351) Weight:  [61.2 kg] 61.2 kg (04/10 0351)  Intake/Output from previous day: 04/09 0701 - 04/10 0700 In: 370 [P.O.:370] Out: 400 [Urine:400] Intake/Output from this shift: No intake/output data recorded.  Physical Exam: Neck: no adenopathy, no carotid bruit, no JVD and supple, symmetrical, trachea midline Lungs: clear to auscultation bilaterally Heart: bradycardic, S1, S2.  Soft systolic murmur and paradoxical splitting of second heart sound noted Abdomen: soft, non-tender; bowel sounds normal; no masses,  no organomegaly Extremities: extremities normal, atraumatic, no cyanosis or edema  Lab Results: Recent Labs    04/06/20 1213  WBC 4.6  HGB 10.7*  PLT 204   Recent Labs    04/08/20 0342 04/09/20 0346  NA 142 141  K 3.5 3.8  CL 106 105  CO2 25 26  GLUCOSE 81 86  BUN 22 26*  CREATININE 1.59* 1.77*   No results for input(s): TROPONINI in the last 72 hours.  Invalid input(s): CK, MB Hepatic Function Panel Recent Labs    04/06/20 1653  PROT 8.2*  ALBUMIN 3.8  AST 123*  ALT 44  ALKPHOS 31*  BILITOT 0.7  BILIDIR 0.1  IBILI 0.6   Recent Labs    04/07/20 0502  CHOL 298*   No results for input(s): PROTIME in the last 72 hours.  Imaging: Imaging results have been reviewed and ECHOCARDIOGRAM COMPLETE  Result Date: 04/07/2020    ECHOCARDIOGRAM REPORT   Patient Name:   Latasha Norton Date of Exam: 04/07/2020 Medical Rec #:  [id number]      Height:       66.0 in Accession #:     [id number]     Weight:       134.0 lb Date of Birth:  January 21, 1925      BSA:          1.687 m Patient Age:    86 years       BP:           143/69 mmHg Patient Gender: F              HR:           58 bpm. Exam Location:  Inpatient Procedure: 2D Echo Indications:     CHF-Acute Diastolic 428.31 / I50.31  History:         Patient has no prior history of Echocardiogram examinations.                  Signs/Symptoms:dementia; Risk Factors:Hypertension.                  Hypothyroidism.  Sonographer:     Leta Jungling RDCS Referring Phys:  [id number] Rometta Emery Diagnosing Phys: Orpah Cobb MD IMPRESSIONS  1. Left ventricular ejection fraction, by estimation, is 45 to 50%. The left ventricle has mildly decreased function. The left ventricle demonstrates regional wall motion abnormalities (see scoring diagram/findings for description). There is mild concentric left ventricular hypertrophy of the anterior and posterior segments. Left ventricular  diastolic parameters are indeterminate. There is mild hypokinesis of the left ventricular, entire septal wall.  2. Right ventricular systolic function is normal. The right ventricular size is normal. There is normal pulmonary artery systolic pressure.  3. Left atrial size was mildly dilated.  4. Right atrial size was mildly dilated.  5. The mitral valve is degenerative. Mild mitral valve regurgitation.  6. The aortic valve is tricuspid. Aortic valve regurgitation is mild. Mild to moderate aortic valve stenosis.  7. There is mild (Grade II) atheroma plaque involving the aortic root and ascending aorta.  8. The inferior vena cava is normal in size with greater than 50% respiratory variability, suggesting right atrial pressure of 3 mmHg. FINDINGS  Left Ventricle: Left ventricular ejection fraction, by estimation, is 45 to 50%. The left ventricle has mildly decreased function. The left ventricle demonstrates regional wall motion abnormalities. Mild hypokinesis of the left ventricular,  entire septal wall. The left ventricular internal cavity size was normal in size. There is mild concentric left ventricular hypertrophy of the anterior and posterior segments. Left ventricular diastolic parameters are indeterminate.  LV Wall Scoring: The entire septum is hypokinetic. The entire anterior wall, entire lateral wall, entire inferior wall, and apex are normal. Right Ventricle: The right ventricular size is normal. No increase in right ventricular wall thickness. Right ventricular systolic function is normal. There is normal pulmonary artery systolic pressure. The tricuspid regurgitant velocity is 2.04 m/s, and  with an assumed right atrial pressure of 3 mmHg, the estimated right ventricular systolic pressure is 19.6 mmHg. Left Atrium: Left atrial size was mildly dilated. Right Atrium: Right atrial size was mildly dilated. Pericardium: There is no evidence of pericardial effusion. Mitral Valve: The mitral valve is degenerative in appearance. There is mild thickening of the mitral valve leaflet(s). There is moderate calcification of the mitral valve leaflet(s). Mild to moderate mitral annular calcification. Mild mitral valve regurgitation. Tricuspid Valve: The tricuspid valve is normal in structure. Tricuspid valve regurgitation is mild. Aortic Valve: The aortic valve is tricuspid. . There is mild thickening and mild calcification of the aortic valve. Aortic valve regurgitation is mild. Aortic regurgitation PHT measures 789 msec. Mild to moderate aortic stenosis is present. Mild aortic valve annular calcification. There is mild thickening of the aortic valve. There is mild calcification of the aortic valve. Pulmonic Valve: The pulmonic valve was normal in structure. Pulmonic valve regurgitation is trivial. Aorta: The aortic root is normal in size and structure. There is mild (Grade II) atheroma plaque involving the aortic root and ascending aorta. Venous: The inferior vena cava is normal in size with  greater than 50% respiratory variability, suggesting right atrial pressure of 3 mmHg. IAS/Shunts: No atrial level shunt detected by color flow Doppler.  LEFT VENTRICLE PLAX 2D LVIDd:         3.60 cm  Diastology LVIDs:         3.10 cm  LV e' lateral:   4.39 cm/s LV PW:         1.40 cm  LV E/e' lateral: 11.5 LV IVS:        1.50 cm  LV e' medial:    4.28 cm/s LVOT diam:     1.80 cm  LV E/e' medial:  11.8 LV SV:         48 LV SV Index:   29 LVOT Area:     2.54 cm  LEFT ATRIUM         Index LA diam:  3.30 cm 1.96 cm/m  AORTIC VALVE LVOT Vmax:   80.90 cm/s LVOT Vmean:  54.700 cm/s LVOT VTI:    0.189 m AI PHT:      789 msec  AORTA Ao Asc diam: 2.90 cm MITRAL VALVE               TRICUSPID VALVE MV Area (PHT): 3.51 cm    TR Peak grad:   16.6 mmHg MV Decel Time: 216 msec    TR Vmax:        204.00 cm/s MV E velocity: 50.35 cm/s MV A velocity: 28.70 cm/s  SHUNTS MV E/A ratio:  1.75        Systemic VTI:  0.19 m                            Systemic Diam: 1.80 cm Dixie Dials MD Electronically signed by Dixie Dials MD Signature Date/Time: 04/07/2020/12:24:40 PM    Final     Cardiac Studies:  Assessment/Plan:  Compensated systolic congestive heart failure. Hypertension. Hypothyroidism. Marked sinus bradycardia secondary to above.rule out sick sinus syndrome Status post hypokalemia. Status post paroxysmal nonsustained VT, asymptomatic Plan Change amlodipine to Procardia XL as per orders. Check labs in a.m.  LOS: 3 days    Charolette Forward 04/09/2020, 9:35 AM

## 2020-04-10 LAB — BASIC METABOLIC PANEL
Anion gap: 8 (ref 5–15)
BUN: 24 mg/dL — ABNORMAL HIGH (ref 8–23)
CO2: 27 mmol/L (ref 22–32)
Calcium: 8.9 mg/dL (ref 8.9–10.3)
Chloride: 105 mmol/L (ref 98–111)
Creatinine, Ser: 1.67 mg/dL — ABNORMAL HIGH (ref 0.44–1.00)
GFR calc Af Amer: 30 mL/min — ABNORMAL LOW (ref >60)
GFR calc non Af Amer: 26 mL/min — ABNORMAL LOW (ref >60)
Glucose, Bld: 79 mg/dL (ref 70–99)
Potassium: 4.8 mmol/L (ref 3.5–5.1)
Sodium: 140 mmol/L (ref 135–145)

## 2020-04-10 LAB — MAGNESIUM: Magnesium: 2.2 mg/dL (ref 1.7–2.4)

## 2020-04-10 MED ORDER — POTASSIUM CHLORIDE CRYS ER 10 MEQ PO TBCR
10.0000 meq | EXTENDED_RELEASE_TABLET | Freq: Every day | ORAL | Status: DC
  Administered 2020-04-10 – 2020-04-11 (×2): 10 meq via ORAL
  Filled 2020-04-10 (×2): qty 1

## 2020-04-10 NOTE — Plan of Care (Signed)
?  Problem: Clinical Measurements: ?Goal: Respiratory complications will improve ?Outcome: Progressing ?  ?Problem: Clinical Measurements: ?Goal: Cardiovascular complication will be avoided ?Outcome: Not Progressing ?  ?

## 2020-04-10 NOTE — Progress Notes (Signed)
I discussed the following information with Dr. Marni Griffon: pt had a 2.47 sec pause this morning. Pt HR was 28 at the time and pt was asleep. I, RN Rose woke pt. Pt HR went briefly to 60 then to 32. Most recent BP was 134/64. Pt's BP was 30s-40s during the night. No new orders were given by Dr. Marni Griffon.

## 2020-04-10 NOTE — Progress Notes (Signed)
Subjective:  Patient denies any complaints noted to have occasional 2.4-second pause and marked junctional rhythm with heart rate in 30s to 40s asymptomatic.  Synthroid dose was increased which she is tolerating okay.  Amlodipine was switched to nifedipine XL yesterday.  Patient not on any AV blocking medication although TSH very high.  Objective:  Vital Signs in the last 24 hours: Temp:  [98 F (36.7 C)-98.2 F (36.8 C)] 98 F (36.7 C) (04/11 0527) Pulse Rate:  [39-51] 51 (04/11 0527) Resp:  [13-17] 17 (04/11 0527) BP: (109-134)/(53-70) 134/64 (04/11 0527) SpO2:  [97 %-100 %] 97 % (04/11 0527) Weight:  [63.2 kg] 63.2 kg (04/11 0527)  Intake/Output from previous day: 04/10 0701 - 04/11 0700 In: 153 [P.O.:150; I.V.:3] Out: 300 [Urine:300] Intake/Output from this shift: No intake/output data recorded.  Physical Exam: Exam unchanged  Lab Results: No results for input(s): WBC, HGB, PLT in the last 72 hours. Recent Labs    04/09/20 0346 04/10/20 0302  NA 141 140  K 3.8 4.8  CL 105 105  CO2 26 27  GLUCOSE 86 79  BUN 26* 24*  CREATININE 1.77* 1.67*   No results for input(s): TROPONINI in the last 72 hours.  Invalid input(s): CK, MB Hepatic Function Panel No results for input(s): PROT, ALBUMIN, AST, ALT, ALKPHOS, BILITOT, BILIDIR, IBILI in the last 72 hours. No results for input(s): CHOL in the last 72 hours. No results for input(s): PROTIME in the last 72 hours.  Imaging: Imaging results have been reviewed and No results found.  Cardiac Studies:  Assessment/Plan:  Compensated systolic congestive heart failure. Hypertension. Hypothyroidism. Marked sinus bradycardia secondary to above.rule out sick sinus syndrome Status post hypokalemia. Status post paroxysmal nonsustained VT, and occasional pauses asymptomatic Plan Continue present management Increase ambulation as tolerated with assistance Check repeat TSH in a.m. Reduce Klor-Con to 10 mEq daily  LOS: 4 days     Rinaldo Cloud 04/10/2020, 9:12 AM

## 2020-04-11 LAB — BASIC METABOLIC PANEL
Anion gap: 8 (ref 5–15)
BUN: 33 mg/dL — ABNORMAL HIGH (ref 8–23)
CO2: 26 mmol/L (ref 22–32)
Calcium: 8.7 mg/dL — ABNORMAL LOW (ref 8.9–10.3)
Chloride: 106 mmol/L (ref 98–111)
Creatinine, Ser: 1.83 mg/dL — ABNORMAL HIGH (ref 0.44–1.00)
GFR calc Af Amer: 27 mL/min — ABNORMAL LOW (ref >60)
GFR calc non Af Amer: 24 mL/min — ABNORMAL LOW (ref >60)
Glucose, Bld: 80 mg/dL (ref 70–99)
Potassium: 5 mmol/L (ref 3.5–5.1)
Sodium: 140 mmol/L (ref 135–145)

## 2020-04-11 LAB — HEPATIC FUNCTION PANEL
ALT: 33 U/L (ref 0–44)
AST: 82 U/L — ABNORMAL HIGH (ref 15–41)
Albumin: 3 g/dL — ABNORMAL LOW (ref 3.5–5.0)
Alkaline Phosphatase: 23 U/L — ABNORMAL LOW (ref 38–126)
Bilirubin, Direct: <0.1 mg/dL (ref 0.0–0.2)
Total Bilirubin: 0.3 mg/dL (ref 0.3–1.2)
Total Protein: 6.7 g/dL (ref 6.5–8.1)

## 2020-04-11 LAB — TSH: TSH: 121.42 u[IU]/mL — ABNORMAL HIGH (ref 0.350–4.500)

## 2020-04-11 NOTE — Progress Notes (Signed)
Ref: Patient, No Pcp Per   Subjective:  Sinus bradycardia and episodes of junctional rhythm continues. HR 30-40 at rest and 50-60 with activity. VS otherwise normal. Levothyroxine dose increased to 125 mcg/day. Repeat TSH too soon and shows little change.   Objective:  Vital Signs in the last 24 hours: Temp:  [98 F (36.7 C)-98.5 F (36.9 C)] 98 F (36.7 C) (04/12 0455) Pulse Rate:  [34-40] 35 (04/12 0458) Cardiac Rhythm: Heart block (04/12 0307) Resp:  [15-19] 15 (04/12 0900) BP: (105-149)/(48-54) 149/54 (04/12 0900) SpO2:  [95 %-100 %] 97 % (04/12 0458)  Physical Exam: BP Readings from Last 1 Encounters:  04/11/20 (!) 149/54     Wt Readings from Last 1 Encounters:  04/10/20 63.2 kg    Weight change:  Body mass index is 22.48 kg/m. HEENT: [place]kins/AT, Eyes-Brown, PERL, EOMI, Conjunctiva-Pink, Sclera-Non-icteric Neck: No JVD, No bruit, Trachea midline. Lungs:  Clearing, Bilateral. Cardiac:  Regular rhythm, normal S1 and S2, no S3. II/VI systolic murmur. Abdomen:  Soft, non-tender. BS present. Extremities:  Trace ankle edema present. No cyanosis. No clubbing. CNS: AxOx2, Cranial nerves grossly intact, Hard of hearing, moves all 4 extremities.  Skin: Warm and dry.   Intake/Output from previous day: 04/11 0701 - 04/12 0700 In: 480 [P.O.:480] Out: 1 [Urine:1]    Lab Results: BMET    Component Value Date/Time   NA 140 04/11/2020 0338   NA 140 04/10/2020 0302   NA 141 04/09/2020 0346   K 5.0 04/11/2020 0338   K 4.8 04/10/2020 0302   K 3.8 04/09/2020 0346   CL 106 04/11/2020 0338   CL 105 04/10/2020 0302   CL 105 04/09/2020 0346   CO2 26 04/11/2020 0338   CO2 27 04/10/2020 0302   CO2 26 04/09/2020 0346   GLUCOSE 80 04/11/2020 0338   GLUCOSE 79 04/10/2020 0302   GLUCOSE 86 04/09/2020 0346   BUN 33 (H) 04/11/2020 0338   BUN 24 (H) 04/10/2020 0302   BUN 26 (H) 04/09/2020 0346   CREATININE 1.83 (H) 04/11/2020 0338   CREATININE 1.67 (H) 04/10/2020 0302   CREATININE  1.77 (H) 04/09/2020 0346   CALCIUM 8.7 (L) 04/11/2020 0338   CALCIUM 8.9 04/10/2020 0302   CALCIUM 8.8 (L) 04/09/2020 0346   GFRNONAA 24 (L) 04/11/2020 0338   GFRNONAA 26 (L) 04/10/2020 0302   GFRNONAA 25 (L) 04/09/2020 0346   GFRAA 27 (L) 04/11/2020 0338   GFRAA 30 (L) 04/10/2020 0302   GFRAA 28 (L) 04/09/2020 0346   CBC    Component Value Date/Time   WBC 4.6 04/06/2020 1213   RBC 3.60 (L) 04/06/2020 1213   HGB 10.7 (L) 04/06/2020 1213   HCT 33.9 (L) 04/06/2020 1213   PLT 204 04/06/2020 1213   MCV 94.2 04/06/2020 1213   MCH 29.7 04/06/2020 1213   MCHC 31.6 04/06/2020 1213   RDW 17.1 (H) 04/06/2020 1213   HEPATIC Function Panel Recent Labs    04/06/20 1653  PROT 8.2*   HEMOGLOBIN A1C No components found for: HGA1C,  MPG CARDIAC ENZYMES No results found for: CKTOTAL, CKMB, CKMBINDEX, TROPONINI BNP No results for input(s): PROBNP in the last 8760 hours. TSH Recent Labs    04/07/20 0502 04/11/20 0338  TSH 137.224* 121.420*   CHOLESTEROL Recent Labs    04/07/20 0502  CHOL 298*    Scheduled Meds: . cholecalciferol  400 Units Oral Daily  . furosemide  20 mg Oral Daily  . heparin injection (subcutaneous)  5,000 Units Subcutaneous Q8H  .  hydrALAZINE  25 mg Oral TID  . levothyroxine  125 mcg Oral Daily  . NIFEdipine  30 mg Oral Daily  . sodium chloride flush  3 mL Intravenous Q12H  . vitamin A  10,000 Units Oral Daily  . vitamin E  100 Units Oral Daily   Continuous Infusions: . sodium chloride     PRN Meds:.sodium chloride, acetaminophen, acetaminophen, diclofenac Sodium, diphenhydrAMINE, fluticasone, ondansetron (ZOFRAN) IV, sodium chloride flush  Assessment/Plan: Acute systolic left heart failure Hypertension Hypothyroidism Sinus bradycardia due to hypothyroidism Non-sustained VT Possible seasonal allergy  Continue current dose of levothyroxine. Recheck level in 4 weeks. May need higher level of thyroxine in future. Increase activity with PT  evaluation. Check Albumin level.   LOS: 5 days   Time spent including chart review, lab review, examination, discussion with patient and nurse : 30 min   Orpah Cobb  MD  04/11/2020, 9:40 AM

## 2020-04-11 NOTE — Care Management Important Message (Signed)
Important Message  Patient Details  Name: Latasha Norton MRN: [id number] Date of Birth: August 04, 1925   Medicare Important Message Given:  Yes     Renie Ora 04/11/2020, 11:38 AM

## 2020-04-11 NOTE — Evaluation (Signed)
Physical Therapy Evaluation Patient Details Name: Latasha Norton MRN: [id number] DOB: [birth year] Today's Date: 04/11/2020   History of Present Illness  86 yo female with onset of acute CHF was admitted for findings of bradycardia d/t hypothyroidism, had non sustained VT, and noted HTN, central venous congestion on xray of chest.  Replenished K+, hydrating.  PMHx:  CHF, HTN, thyroid disease, cardiomegaly,   Clinical Impression  Pt was seen for mobility on RW and observation of vitals.  Her gait on RW was 45' with pulses pre at 43, post 53.  O2 sats were 96% consistently on 2L via cannula.  Follow up with her to get through need to have assistance, and will monitor her pulses.  Family may elect to take her home from [place] but will need assistance 24/7 initially with O2 and pulses fluctutating.  Follow for balance, endurance and monitoring of tolerance for mobility, including ck of need for O2 when appropriate.    Follow Up Recommendations SNF    Equipment Recommendations  None recommended by PT    Recommendations for Other Services       Precautions / Restrictions Precautions Precautions: Fall;Other (comment)(monitor HR) Precaution Comments: bradycardia, ck sats Restrictions Weight Bearing Restrictions: No      Mobility  Bed Mobility Overal bed mobility: Needs Assistance Bed Mobility: Supine to Sit;Sit to Supine     Supine to sit: Min assist Sit to supine: Min assist   General bed mobility comments: min assist with bedclothes and coming up to sit, to return to bed with lines and covering  Transfers Overall transfer level: Needs assistance Equipment used: Rolling walker (2 wheeled);1 person hand held assist Transfers: Sit to/from Stand Sit to Stand: Min guard         General transfer comment: min guard to steady but min assist for clothing at [facility]  Ambulation/Gait Ambulation/Gait assistance: Min assist;Min guard Gait Distance (Feet): 45 Feet Assistive device: Rolling  walker (2 wheeled);1 person hand held assist Gait Pattern/deviations: Step-through pattern;Trunk flexed Gait velocity: reduced Gait velocity interpretation: <1.31 ft/sec, indicative of household ambulator General Gait Details: pt is requiring help to navigate crowded environment with lines  Stairs            Wheelchair Mobility    Modified Rankin (Stroke Patients Only)       Balance Overall balance assessment: Needs assistance Sitting-balance support: Feet supported Sitting balance-Leahy Scale: Fair     Standing balance support: Bilateral upper extremity supported;During functional activity Standing balance-Leahy Scale: Poor                               Pertinent Vitals/Pain Pain Assessment: No/denies pain    Home Living Family/patient expects to be discharged to:: Private residence Living Arrangements: Alone Available Help at Discharge: Family;Available PRN/intermittently Type of Home: House Home Access: Stairs to enter Entrance Stairs-Rails: Right Entrance Stairs-Number of Steps: 1 Home Layout: One level Home Equipment: Walker - 2 wheels;Cane - single point Additional Comments: pt is describing family coming by to see her and help but states she is going to stay with granddaughter a "day or two"    Prior Function Level of Independence: Independent with assistive device(s)         Comments: SPC when she goes out, no AD at home     Hand Dominance   Dominant Hand: Right    Extremity/Trunk Assessment   Upper Extremity Assessment Upper Extremity Assessment: Generalized weakness  Lower Extremity Assessment Lower Extremity Assessment: Generalized weakness    Cervical / Trunk Assessment Cervical / Trunk Assessment: Kyphotic  Communication   Communication: No difficulties  Cognition Arousal/Alertness: Awake/alert Behavior During Therapy: WFL for tasks assessed/performed Overall Cognitive Status: Within Functional Limits for tasks  assessed                                 General Comments: answering questions appropriately      General Comments General comments (skin integrity, edema, etc.): pt is able to stand with help to support clothing at [facility], and handle drying herself    Exercises     Assessment/Plan    PT Assessment Patient needs continued PT services  PT Problem List Decreased strength;Decreased range of motion;Decreased activity tolerance;Decreased balance;Decreased mobility;Cardiopulmonary status limiting activity       PT Treatment Interventions DME instruction;Gait training;Stair training;Functional mobility training;Therapeutic activities;Therapeutic exercise;Balance training;Neuromuscular re-education;Patient/family education    PT Goals (Current goals can be found in the Care Plan section)  Acute Rehab PT Goals Patient Stated Goal: to go to granddaughter's house PT Goal Formulation: With patient Time For Goal Achievement: 04/18/20 Potential to Achieve Goals: Good    Frequency Min 3X/week   Barriers to discharge Inaccessible home environment;Decreased caregiver support home alone with stair to enter    Co-evaluation               AM-PAC PT "6 Clicks" Mobility  Outcome Measure Help needed turning from your back to your side while in a flat bed without using bedrails?: None Help needed moving from lying on your back to sitting on the side of a flat bed without using bedrails?: A Little Help needed moving to and from a bed to a chair (including a wheelchair)?: A Little Help needed standing up from a chair using your arms (e.g., wheelchair or bedside chair)?: A Little Help needed to walk in hospital room?: A Little Help needed climbing 3-5 steps with a railing? : A Lot 6 Click Score: 18    End of Session Equipment Utilized During Treatment: Gait belt;Oxygen Activity Tolerance: Patient tolerated treatment well;Patient limited by fatigue;Treatment limited secondary to  medical complications (Comment) Patient left: in bed;with call bell/phone within reach;with bed alarm set Nurse Communication: Mobility status PT Visit Diagnosis: Unsteadiness on feet (R26.81);Muscle weakness (generalized) (M62.81);Difficulty in walking, not elsewhere classified (R26.2)    Time: 5456-2563 PT Time Calculation (min) (ACUTE ONLY): 35 min   Charges:   PT Evaluation $PT Eval Moderate Complexity: 1 Mod PT Treatments $Gait Training: 8-22 mins       Ivar Drape 04/11/2020, 5:01 PM  Samul Dada, PT MS Acute Rehab Dept. Number: [facility] [id number] and [facility] [phone]

## 2020-04-12 ENCOUNTER — Ambulatory Visit: Payer: Self-pay | Admitting: Internal Medicine

## 2020-04-12 DIAGNOSIS — R001 Bradycardia, unspecified: Secondary | ICD-10-CM | POA: Diagnosis not present

## 2020-04-12 MED ORDER — LEVOTHYROXINE SODIUM 25 MCG PO TABS
25.0000 ug | ORAL_TABLET | Freq: Once | ORAL | Status: AC
  Administered 2020-04-15: 25 ug via ORAL
  Filled 2020-04-12: qty 1

## 2020-04-12 MED ORDER — HYDRALAZINE HCL 50 MG PO TABS
50.0000 mg | ORAL_TABLET | Freq: Three times a day (TID) | ORAL | Status: DC
  Administered 2020-04-12 – 2020-04-15 (×10): 50 mg via ORAL
  Filled 2020-04-12 (×10): qty 1

## 2020-04-12 MED ORDER — HEPARIN SODIUM (PORCINE) 5000 UNIT/ML IJ SOLN
5000.0000 [IU] | Freq: Three times a day (TID) | INTRAMUSCULAR | Status: DC
  Administered 2020-04-12 – 2020-04-15 (×10): 5000 [IU] via SUBCUTANEOUS
  Filled 2020-04-12 (×9): qty 1

## 2020-04-12 MED ORDER — LEVOTHYROXINE SODIUM 75 MCG PO TABS
150.0000 ug | ORAL_TABLET | Freq: Every day | ORAL | Status: DC
  Administered 2020-04-12 – 2020-04-15 (×4): 150 ug via ORAL
  Filled 2020-04-12 (×4): qty 2

## 2020-04-12 NOTE — Progress Notes (Signed)
Ref: Patient, No Pcp Per   Subjective:  Episodes of Sinus bradycardia and junctional rhythm continues. HR in high 20's at rest. HR improves to 40-50's with activity.   Objective:  Vital Signs in the last 24 hours: Temp:  [97.8 F (36.6 C)-98.2 F (36.8 C)] 97.8 F (36.6 C) (04/13 0546) Pulse Rate:  [34-44] 34 (04/13 0546) Cardiac Rhythm: Junctional rhythm (04/13 0253) Resp:  [16-22] 20 (04/13 0546) BP: (121-149)/(44-68) 121/47 (04/13 0546) SpO2:  [98 %-100 %] 100 % (04/13 0546) Weight:  [61.3 kg] 61.3 kg (04/13 0546)  Physical Exam: BP Readings from Last 1 Encounters:  04/12/20 (!) 121/47     Wt Readings from Last 1 Encounters:  04/12/20 61.3 kg    Weight change:  Body mass index is 21.81 kg/m. HEENT: [place]/AT, Eyes-Brown, PERL, EOMI, Conjunctiva-Pink, Sclera-Non-icteric Neck: No JVD, No bruit, Trachea midline. Lungs:  Clearing, Bilateral. Cardiac:  Regular rhythm, normal S1 and S2, no S3. II/VI systolic murmur. Abdomen:  Soft, non-tender. BS present. Extremities:  Trace edema present. No cyanosis. No clubbing. CNS: AxOx2 and hard of hearing, Cranial nerves grossly intact, moves all 4 extremities.  Skin: Warm and dry.   Intake/Output from previous day: 04/12 0701 - 04/13 0700 In: 360 [P.O.:360] Out: 250 [Urine:250]    Lab Results: BMET    Component Value Date/Time   NA 140 04/11/2020 0338   NA 140 04/10/2020 0302   NA 141 04/09/2020 0346   K 5.0 04/11/2020 0338   K 4.8 04/10/2020 0302   K 3.8 04/09/2020 0346   CL 106 04/11/2020 0338   CL 105 04/10/2020 0302   CL 105 04/09/2020 0346   CO2 26 04/11/2020 0338   CO2 27 04/10/2020 0302   CO2 26 04/09/2020 0346   GLUCOSE 80 04/11/2020 0338   GLUCOSE 79 04/10/2020 0302   GLUCOSE 86 04/09/2020 0346   BUN 33 (H) 04/11/2020 0338   BUN 24 (H) 04/10/2020 0302   BUN 26 (H) 04/09/2020 0346   CREATININE 1.83 (H) 04/11/2020 0338   CREATININE 1.67 (H) 04/10/2020 0302   CREATININE 1.77 (H) 04/09/2020 0346   CALCIUM 8.7  (L) 04/11/2020 0338   CALCIUM 8.9 04/10/2020 0302   CALCIUM 8.8 (L) 04/09/2020 0346   GFRNONAA 24 (L) 04/11/2020 0338   GFRNONAA 26 (L) 04/10/2020 0302   GFRNONAA 25 (L) 04/09/2020 0346   GFRAA 27 (L) 04/11/2020 0338   GFRAA 30 (L) 04/10/2020 0302   GFRAA 28 (L) 04/09/2020 0346   CBC    Component Value Date/Time   WBC 4.6 04/06/2020 1213   RBC 3.60 (L) 04/06/2020 1213   HGB 10.7 (L) 04/06/2020 1213   HCT 33.9 (L) 04/06/2020 1213   PLT 204 04/06/2020 1213   MCV 94.2 04/06/2020 1213   MCH 29.7 04/06/2020 1213   MCHC 31.6 04/06/2020 1213   RDW 17.1 (H) 04/06/2020 1213   HEPATIC Function Panel Recent Labs    04/06/20 1653 04/11/20 0338  PROT 8.2* 6.7   HEMOGLOBIN A1C No components found for: HGA1C,  MPG CARDIAC ENZYMES No results found for: CKTOTAL, CKMB, CKMBINDEX, TROPONINI BNP No results for input(s): PROBNP in the last 8760 hours. TSH Recent Labs    04/07/20 0502 04/11/20 0338  TSH 137.224* 121.420*   CHOLESTEROL Recent Labs    04/07/20 0502  CHOL 298*    Scheduled Meds: . cholecalciferol  400 Units Oral Daily  . furosemide  20 mg Oral Daily  . heparin injection (subcutaneous)  5,000 Units Subcutaneous Q8H  .  hydrALAZINE  50 mg Oral TID  . [START ON 04/13/2020] levothyroxine  150 mcg Oral Daily  . levothyroxine  25 mcg Oral Once  . sodium chloride flush  3 mL Intravenous Q12H  . vitamin A  10,000 Units Oral Daily  . vitamin E  100 Units Oral Daily   Continuous Infusions: . sodium chloride     PRN Meds:.sodium chloride, acetaminophen, acetaminophen, diclofenac Sodium, diphenhydrAMINE, fluticasone, ondansetron (ZOFRAN) IV, sodium chloride flush  Assessment/Plan: Acute systolic heart failure, compensated Hypothyroidism Sick sinus syndrome Junctional rhythm Hypertension Non-sustained VT Possible seasonal allergy  Increase Levothyroxine to 150 mcg per day. Increase hydralazine to 50 mg. 3 times daily. Hold Procardia. EP consult for permanent  pacemaker for sick sinus syndrome and Need for calcium channel blocker and Beta blocker for BP, CHF and ventricular arrhythmias.   LOS: 6 days   Time spent including chart review, lab review, examination, discussion with patient and consultant : 30 min   Dixie Dials  MD  04/12/2020, 9:38 AM

## 2020-04-12 NOTE — Consult Note (Addendum)
Cardiology Consultation:   Patient ID: [name] MRN: [id number]; DOB: 01/02/1925  Admit date: 04/06/2020 Date of Consult: 04/12/2020  Primary Care Provider: Patient, No Pcp Per Primary Cardiologist: No primary care provider on file.  Primary Electrophysiologist:  None    Patient Profile:   Latasha Norton is a 86 y.o. female with a hx of HTN, hypothyroidism (h/o radioactive I-131 treatment), early dementia, unclear h/o dementia, CKD (III) who is being seen today for the evaluation of bradycardia at the request of Dr. Algie Coffer.  History of Present Illness:   Latasha Norton was admitted to [facility] 04/06/20 with worsening SOB and edema, felt to be CHF exacerbation and started on IV diuretics.She was described as having transient bradycardia as well  Cardiology is on case, is attending.   TSH was noted to be markedly abnormal (137), her levothyroxine has been up titrated from daily to 125 >150, her last TSH 121 (apparently had not been taking her synthroid at home for a few months) She has been observed to be bradycardic with junctional rates that are in the 20's and increase to the 50's with activity as reported in the chart  Her home amlodipine was changed to procardia XL it seems for BP control.  TTE noted LVEF 45-50%, mild hypokinesis of the entire septal wall.  Procardia XL 30mg  was stopped, last dose 0900 yesterday  EP is asked to weigh in on bradycardia sick sinus syndrome, bradycardia, felt to require CCB or BB for BP control and HF management   LABS K+ 2.9 > 3.4 >>> 5.0 Mag 2.6 BUN/Creat 30/1.91  > 33/1.83 BNP 70 HS Trop 37 > 33 WBC 4.6 H/H 10/33 Plts 204  TSH 137.224 >> 121.420 (known h/o radiation therapy)   She is in bedside chair, working on 11/33.  Says her family just left.  She denies any CP, says the oxygen has helped her SOB.  Tells me she came to the hospital for swollen feet.  She is unclear about her medicines, says her grand daughter picks them up  for her and she takes what is there as the bottles say to. She care for her self though says she has a big family with support. She denies dizziness, near syncope or syncope, denies any unusual weakness   Past Medical History:  Diagnosis Date  . CHF (congestive heart failure) (HCC)   . Hypertension   . Thyroid disease     History reviewed. No pertinent surgical history.   Home Medications:  Prior to Admission medications   Medication Sig Start Date End Date Taking? Authorizing Provider  amLODipine (NORVASC) 10 MG tablet Take 10 mg by mouth daily. 01/15/20  Yes [provider]  diclofenac Sodium (VOLTAREN) 1 % GEL Apply 1 application topically daily as needed for pain. 04/04/20  Yes [provider]  fluticasone (FLONASE) 50 MCG/ACT nasal spray Place 1 spray into both nostrils daily as needed for congestion. 12/25/19  Yes [provider]  hydrALAZINE (APRESOLINE) 25 MG tablet Take 25 mg by mouth 3 (three) times daily. 01/14/20  Yes [provider]  hydrochlorothiazide (MICROZIDE) 12.5 MG capsule Take 12.5 mg by mouth daily. 02/03/20  Yes [provider]  levothyroxine (SYNTHROID) 100 MCG tablet Take 100 mcg by mouth daily. 04/04/20  Yes [provider]  VITAMIN A PO Take 1 tablet by mouth daily.    Yes [provider]  VITAMIN D PO Take 1 tablet by mouth daily.    Yes [provider]  VITAMIN E PO Take 1 tablet by mouth daily.    Yes [provider]    Inpatient Medications: Scheduled Meds: . cholecalciferol  400 Units Oral Daily  . furosemide  20 mg Oral Daily  . heparin injection (subcutaneous)  5,000 Units Subcutaneous Q8H  . hydrALAZINE  50 mg Oral TID  . [START ON 04/13/2020] levothyroxine  150 mcg Oral Daily  . levothyroxine  25 mcg Oral Once  . sodium chloride flush  3 mL Intravenous Q12H  . vitamin A  10,000 Units Oral Daily  . vitamin E  100 Units Oral Daily   Continuous Infusions: . sodium  chloride     PRN Meds: sodium chloride, acetaminophen, acetaminophen, diclofenac Sodium, diphenhydrAMINE, fluticasone, ondansetron (ZOFRAN) IV, sodium chloride flush  Allergies:   No Known Allergies  Social History:   Social History   Socioeconomic History  . Marital status: Widowed    Spouse name: Not on file  . Number of children: Not on file  . Years of education: Not on file  . Highest education level: Not on file  Occupational History  . Not on file  Tobacco Use  . Smoking status: Not on file  Substance and Sexual Activity  . Alcohol use: Not on file  . Drug use: Not on file  . Sexual activity: Not on file  Other Topics Concern  . Not on file  Social History Narrative  . Not on file   Social Determinants of Health   Financial Resource Strain:   . Difficulty of Paying Living Expenses:   Food Insecurity:   . Worried About Programme researcher, broadcasting/film/video in the Last Year:   . Barista in the Last Year:   Transportation Needs:   . Freight forwarder (Medical):   Marland Kitchen Lack of Transportation (Non-Medical):   Physical Activity:   . Days of Exercise per Week:   . Minutes of Exercise per Session:   Stress:   . Feeling of Stress :   Social Connections:   . Frequency of Communication with Friends and Family:   . Frequency of Social Gatherings with Friends and Family:   . Attends Religious Services:   . Active Member of Clubs or Organizations:   . Attends Banker Meetings:   Marland Kitchen Marital Status:   Intimate Partner Violence:   . Fear of Current or Ex-Partner:   . Emotionally Abused:   Marland Kitchen Physically Abused:   . Sexually Abused:     Family History:   History reviewed. She can not recall any particular family heart problems  ROS:  Please see the history of present illness.  All other ROS reviewed and negative.     Physical Exam/Data:   Vitals:   04/11/20 2339 04/12/20 0440 04/12/20 0546 04/12/20 1000  BP:   (!) 121/47 (!) 156/65  Pulse:   (!) 34 (!) 40    Resp: 16 20 20    Temp:   97.8 F (36.6 C)   TempSrc:   Oral   SpO2:   100%   Weight:   61.3 kg   Height:        Intake/Output Summary (Last 24 hours) at 04/12/2020 1342 Last data filed at 04/12/2020 1045 Gross per 24 hour  Intake 323 ml  Output 250 ml  Net 73 ml   Last 3 Weights 04/12/2020 04/10/2020 04/09/2020  Weight (lbs) 135 lb 1.6 oz 139 lb 4.8 oz 135 lb  Weight (kg) 61.281 kg 63.186 kg  61.236 kg     Body mass index is 21.81 kg/m.  General:  Well nourished, elerly patient, in no acute distress HEENT: normal Lymph: no adenopathy Neck: no JVD Endocrine:  No thryomegaly Vascular: No carotid bruits  Cardiac:   RRR; bradycardic, no murmurs, gallops or rubs Lungs:  CTA b/l no wheezing, rhonchi or rales  Abd: soft, nontender Ext: trace edema Musculoskeletal:  No deformities, age appropriate atrophy Skin: warm and dry  Neuro:  no focal abnormalities noted Psych:  Normal affect   EKG:  The EKG was personally reviewed and demonstrates:   #1 uncertain underlying rhythm, V  Rate is 72, IVCD #2 junctional rhythm 45bpm, LBBB #3 Junctional rhythm 39bpm, LBBB   Telemetry:  Telemetry was personally reviewed and demonstrates:   Telemetry is reviewed, she has had rates 40-50's initially with slowing in the last few days of 30's, more noted at night, though also during say time. Often these are sinus bradycardia (when gained up to see better) She has some times of alternating bundle morphologies    Relevant CV Studies:  04/07/2020: TTE IMPRESSIONS  1. Left ventricular ejection fraction, by estimation, is 45 to 50%. The  left ventricle has mildly decreased function. The left ventricle  demonstrates regional wall motion abnormalities (see scoring  diagram/findings for description). There is mild  concentric left ventricular hypertrophy of the anterior and posterior  segments. Left ventricular diastolic parameters are indeterminate. There  is mild hypokinesis of the left  ventricular, entire septal wall.  2. Right ventricular systolic function is normal. The right ventricular  size is normal. There is normal pulmonary artery systolic pressure.  3. Left atrial size was mildly dilated.  4. Right atrial size was mildly dilated.  5. The mitral valve is degenerative. Mild mitral valve regurgitation.  6. The aortic valve is tricuspid. Aortic valve regurgitation is mild.  Mild to moderate aortic valve stenosis.  7. There is mild (Grade II) atheroma plaque involving the aortic root and  ascending aorta.  8. The inferior vena cava is normal in size with greater than 50%  respiratory variability, suggesting right atrial pressure of 3 mmHg.   Laboratory Data:  High Sensitivity Troponin:   Recent Labs  Lab 04/06/20 1213 04/06/20 1653  TROPONINIHS 37* 33*     Chemistry Recent Labs  Lab 04/09/20 0346 04/10/20 0302 04/11/20 0338  NA 141 140 140  K 3.8 4.8 5.0  CL 105 105 106  CO2 26 27 26   GLUCOSE 86 79 80  BUN 26* 24* 33*  CREATININE 1.77* 1.67* 1.83*  CALCIUM 8.8* 8.9 8.7*  GFRNONAA 25* 26* 24*  GFRAA 28* 30* 27*  ANIONGAP 10 8 8     Recent Labs  Lab 04/06/20 1653 04/11/20 0338  PROT 8.2* 6.7  ALBUMIN 3.8 3.0*  AST 123* 82*  ALT 44 33  ALKPHOS 31* 23*  BILITOT 0.7 0.3   Hematology Recent Labs  Lab 04/06/20 1213  WBC 4.6  RBC 3.60*  HGB 10.7*  HCT 33.9*  MCV 94.2  MCH 29.7  MCHC 31.6  RDW 17.1*  PLT 204   BNP Recent Labs  Lab 04/06/20 1653  BNP 70.6    DDimer No results for input(s): DDIMER in the last 168 hours.   Radiology/Studies:  No results found. {   Assessment and Plan:   1. Marked bradycardia, sinus and junctional escape noted     No old EKGs to review     LBBB     Currently low atrial/junctional  rhythm 40's  She is very hard of hearing though AAOx4.   She is unclear if she was taking her synthroid, says her grand daughter picks up her medicines for her.  Dr. Doylene Canard mentions she was off for 3-4  months  She mentions a fainting spell 18 years ago, can not remember exactly what caused it but never has again.  She has reversible causes for her bradycardia 1. severe hypothyroidism 2. Medication the 1/2 life of procardia (in elderly) is 7 hours (not quite finished 5 1/2 lives yet)  She has mild CM and HF perhaps also 2/2 Thyroid (no pericardial effusion)  I do not think she has pacing indication yet Use alternative agents for BP, avoiding all potential nodal blocking agents BP currently looks acceptable for this elderly patient  Recommend endocrinology consult perhaps medicine if endocrinology unable to see her  Dr. Rayann Heman will see the patient later today      For questions or updates, please contact [facility] Please consult [email or web address] for contact info under     Signed, Baldwin Jamaica, PA-C  04/12/2020 1:42 PM   I have seen, examined the patient, and reviewed the above assessment and plan.  Changes to above are made where necessary.  On exam, elderly and chronically ill, frail, bradycardic.  The patient presents with bradycardia in the setting of profound hypothyroidism.  I worry that she may not be able to comply with thyroid replacement on her own.  She may eventually require assisted nursing.  I did offer pacing to her today which she declines.   Given her advanced age and fragility, this is reasonable.  Ultimately, palliative options ay be best.  Electrophysiology team to see as needed while here. Please call with questions.   Co Sign: Thompson Grayer, MD 04/12/2020 7:47 PM

## 2020-04-12 NOTE — TOC Progression Note (Signed)
Transition of Care [facility]) - Progression Note    Patient Details  Name: Latasha Norton MRN: [id number] Date of Birth: Mar 09, 1925  [facility]) CM/SW Contact  Terrial Rhodes, LCSWA Phone Number: 04/12/2020, 12:39 PM  Clinical Narrative:     CSW spoke with patients relative Britta Mccreedy and she prefers for patient to receive Home Health Services. CSW reached out case manager to let her know. Case manager will call Britta Mccreedy in regards to Home Health services for patient.  CSW will continue to follow.         Expected Discharge Plan and Services                                                 Social Determinants of Health (SDOH) Interventions    Readmission Risk Interventions No flowsheet data found.

## 2020-04-12 NOTE — TOC Initial Note (Addendum)
Transition of Care [facility]) - Initial/Assessment Note    Patient Details  Name: Latasha Norton MRN: [id number] Date of Birth: 09/20/1925  Transition of Care [facility]) CM/SW Contact:    Lawerance Sabal, RN Phone Number: 04/12/2020, 3:05 PM  Clinical Narrative:       Spoke w patent and family at bedside.  Patient refuses SNF She is agreeable to any [facility] agency. [facility] declined, will continue to try to find provider [facility] accepted  Requested RW to be delivered to room today             Expected Discharge Plan: Home w Home Health Services Barriers to Discharge: Continued Medical Work up   Patient Goals and CMS Choice Patient states their goals for this hospitalization and ongoing recovery are:: to go home- refusing [facility] CMS Medicare.gov Compare Post Acute Care list provided to:: Patient Choice offered to / list presented to : Patient  Expected Discharge Plan and Services Expected Discharge Plan: Home w Home Health Services                         DME Arranged: Walker rolling DME Agency: [employer]                  Prior Living Arrangements/Services   Lives with:: Self                   Activities of Daily Living Home Assistive Devices/Equipment: None ADL Screening (condition at time of admission) Patient's cognitive ability adequate to safely complete daily activities?: Yes Is the patient deaf or have difficulty hearing?: Yes Does the patient have difficulty seeing, even when wearing glasses/contacts?: No Does the patient have difficulty concentrating, remembering, or making decisions?: No Patient able to express need for assistance with ADLs?: Yes Does the patient have difficulty dressing or bathing?: No Independently performs ADLs?: Yes (appropriate for developmental age) Does the patient have difficulty walking or climbing stairs?: No Weakness of Legs: None Weakness of Arms/Hands: None  Permission Sought/Granted                  Emotional  Assessment              Admission diagnosis:  Hypokalemia [E87.6] Acute exacerbation of CHF (congestive heart failure) (HCC) [I50.9] AKI (acute kidney injury) (HCC) [N17.9] Swelling of lower extremity [M79.89] Patient Active Problem List   Diagnosis Date Noted  . Hypokalemia 04/06/2020  . Benign essential HTN 04/06/2020  . Hypothyroid 04/06/2020  . Acute exacerbation of CHF (congestive heart failure) (HCC) 04/06/2020  . CKD (chronic kidney disease), stage III 04/06/2020  . Bradycardia 04/06/2020   PCP:  Patient, No Pcp Per Pharmacy:   [facility] [phone] [place], [place] - [address] [place] [address] Phone: [phone] Fax: [phone]     Social Determinants of Health (SDOH) Interventions    Readmission Risk Interventions No flowsheet data found.

## 2020-04-12 NOTE — Progress Notes (Signed)
  Mobility Specialist Criteria Algorithm Info.  Mobility Team: [facility] elevated:Self regulated Activity: Ambulated in hall;Transferred:  Bed to chair (To chair after ambulation) Range of motion: Active;All extremities Level of assistance: Minimal assist, patient does 75% or more (Min A sit/stand ) Assistive device: Front wheel walker Minutes sitting in chair:  Minutes stood: 5 minutes Minutes ambulated: 5 minutes Distance ambulated (ft): 40 ft Mobility response: Tolerated fair (Needs cues for hand placement on walker and to push off the EOB to stand) Bed Position: Chair (Recliner Chair)    04/12/2020 2:44 PM

## 2020-04-12 NOTE — Plan of Care (Signed)

## 2020-04-13 ENCOUNTER — Encounter (HOSPITAL_COMMUNITY): Payer: Self-pay | Admitting: Internal Medicine

## 2020-04-13 LAB — CREATININE, SERUM
Creatinine, Ser: 1.71 mg/dL — ABNORMAL HIGH (ref 0.44–1.00)
GFR calc Af Amer: 30 mL/min — ABNORMAL LOW (ref >60)
GFR calc non Af Amer: 26 mL/min — ABNORMAL LOW (ref >60)

## 2020-04-13 NOTE — NC FL2 (Signed)
[place] MEDICAID FL2 LEVEL OF CARE SCREENING TOOL     IDENTIFICATION  Patient Name: Latasha Norton Birthdate: 1925-04-05 Sex: female Admission Date (Current Location): 04/06/2020  [facility] and [place] Number:  Producer, television/film/video and Address:  [facility], [address], [place], [place] [address]      Provider Number: [id number]  Attending Physician Name and Address:  Orpah Cobb, MD  Relative Name and Phone Number:  Britta Mccreedy [phone]    Current Level of Care: Hospital Recommended Level of Care: Skilled Nursing Facility Prior Approval Number:    Date Approved/Denied: 04/13/20 PASRR Number: [id number]  Discharge Plan: SNF    Current Diagnoses: Patient Active Problem List   Diagnosis Date Noted  . Hypokalemia 04/06/2020  . Benign essential HTN 04/06/2020  . Hypothyroid 04/06/2020  . Acute exacerbation of CHF (congestive heart failure) (HCC) 04/06/2020  . CKD (chronic kidney disease), stage III 04/06/2020  . Bradycardia 04/06/2020    Orientation RESPIRATION BLADDER Height & Weight     Self, Time, Situation, Place  O2(Nasal Cannula 2 Liters) Continent Weight: 134 lb 12.8 oz (61.1 kg) Height:  5\' 6"  (167.6 cm)  BEHAVIORAL SYMPTOMS/MOOD NEUROLOGICAL BOWEL NUTRITION STATUS      Continent Diet(See Discharge Summary)  AMBULATORY STATUS COMMUNICATION OF NEEDS Skin   Limited Assist Verbally Other (Comment)(Appropriate for Ethnicity, Dry Intact Skin Turgor non-tenting)                       Personal Care Assistance Level of Assistance  Bathing, Feeding, Dressing Bathing Assistance: Limited assistance Feeding assistance: Independent(able to feed self) Dressing Assistance: Limited assistance     Functional Limitations Info  Sight, Hearing, Speech Sight Info: Adequate Hearing Info: Adequate Speech Info: Adequate    SPECIAL CARE FACTORS FREQUENCY  PT (By licensed PT), OT (By licensed OT)     PT Frequency: 5x min weekly OT  Frequency: 5x min weekly            Contractures Contractures Info: Not present    Additional Factors Info  Code Status Code Status Info: FULL             Current Medications (04/13/2020):  This is the current hospital active medication list Current Facility-Administered Medications  Medication Dose Route Frequency Provider Last Rate Last Admin  . 0.9 %  sodium chloride infusion  250 mL Intravenous PRN 04/15/2020, MD      . acetaminophen (TYLENOL) tablet 650 mg  650 mg Oral Q4H PRN Rometta Emery, MD      . acetaminophen (TYLENOL) tablet 650 mg  650 mg Oral QHS PRN Rometta Emery, MD   650 mg at 04/12/20 2158  . cholecalciferol (VITAMIN D3) tablet 400 Units  400 Units Oral Daily 2159, MD   400 Units at 04/13/20 0929  . diclofenac Sodium (VOLTAREN) 1 % topical gel 1 application  1 application Topical Daily PRN 04/15/20, MD   1 application at 04/13/20 0929  . diphenhydrAMINE (BENADRYL) capsule 25 mg  25 mg Oral QHS PRN 04/15/20, MD   25 mg at 04/11/20 2104  . fluticasone (FLONASE) 50 MCG/ACT nasal spray 1 spray  1 spray Each Nare Daily PRN 2105 L, MD      . furosemide (LASIX) tablet 20 mg  20 mg Oral Daily Earlie Lou, MD   20 mg at 04/13/20 0929  . heparin injection 5,000 Units  5,000 Units Subcutaneous Q8H Kadakia,  Ajay, MD   5,000 Units at 04/13/20 0607  . hydrALAZINE (APRESOLINE) tablet 50 mg  50 mg Oral TID Kadakia, Ajay, MD   50 mg at 04/13/20 0928  . levothyroxine (SYNTHROID) tablet 150 mcg  150 mcg Oral Daily Kadakia, Ajay, MD   150 mcg at 04/13/20 0606  . levothyroxine (SYNTHROID) tablet 25 mcg  25 mcg Oral Once Kadakia, Ajay, MD   Stopped at 04/12/20 1044  . ondansetron (ZOFRAN) injection 4 mg  4 mg Intravenous Q6H PRN Garba, Mohammad L, MD      . sodium chloride flush (NS) 0.9 % injection 3 mL  3 mL Intravenous Q12H Garba, Mohammad L, MD   3 mL at 04/13/20 0929  . sodium chloride flush (NS) 0.9 % injection 3 mL  3 mL  Intravenous PRN Garba, Mohammad L, MD      . vitamin A capsule 10,000 Units  10,000 Units Oral Daily Garba, Mohammad L, MD   10,000 Units at 04/13/20 0928  . vitamin E capsule 100 Units  100 Units Oral Daily Garba, Mohammad L, MD   100 Units at 04/13/20 0928     Discharge Medications: Please see discharge summary for a list of discharge medications.  Relevant Imaging Results:  Relevant Lab Results:   Additional Information [id number]  Wilbon Obenchain B Kayliee Atienza, LCSWA    

## 2020-04-13 NOTE — TOC Initial Note (Signed)
Transition of Care [facility]) - Initial/Assessment Note    Patient Details  Name: Latasha Norton MRN: [id number] Date of Birth: May 25, 1925  Transition of Care [facility]) CM/SW Contact:    Terrial Rhodes, LCSWA Phone Number: 04/13/2020, 11:43 AM  Clinical Narrative:            CSW spoke with patient at bedside. Patient wants CSW to reach out to daughter Doristine Johns to see if her Granddaughter Joni Reining can take care of her at home. If not, patient gave CSW permission to send out referral to SNF placement in [place] area. CSW called patients daughter Britta Mccreedy.CSW made patient aware that her daughter Britta Mccreedy said patients Granddaughter will not be able to take care of her at home.  CSW faxed out to [place] area. CSW started insurance authorization.  Pending bed offers. Insurance authorization pending.  CSW will continue to follow.   Expected Discharge Plan: Skilled Nursing Facility Barriers to Discharge: No Barriers Identified   Patient Goals and CMS Choice Patient states their goals for this hospitalization and ongoing recovery are:: to go to SNF CMS Medicare.gov Compare Post Acute Care list provided to:: Patient Choice offered to / list presented to : Patient  Expected Discharge Plan and Services Expected Discharge Plan: Skilled Nursing Facility       Living arrangements for the past 2 months: Single Family Home                 DME Arranged: Walker rolling DME Agency: [employer]                  Prior Living Arrangements/Services Living arrangements for the past 2 months: Single Family Home Lives with:: Self Patient language and need for interpreter reviewed:: Yes Do you feel safe going back to the place where you live?: No   SNF  Need for Family Participation in Patient Care: Yes (Comment) Care giver support system in place?: Yes (comment)   Criminal Activity/Legal Involvement Pertinent to Current Situation/Hospitalization: No - Comment as needed  Activities of Daily  Living Home Assistive Devices/Equipment: None ADL Screening (condition at time of admission) Patient's cognitive ability adequate to safely complete daily activities?: Yes Is the patient deaf or have difficulty hearing?: Yes Does the patient have difficulty seeing, even when wearing glasses/contacts?: No Does the patient have difficulty concentrating, remembering, or making decisions?: No Patient able to express need for assistance with ADLs?: Yes Does the patient have difficulty dressing or bathing?: No Independently performs ADLs?: Yes (appropriate for developmental age) Does the patient have difficulty walking or climbing stairs?: No Weakness of Legs: None Weakness of Arms/Hands: None  Permission Sought/Granted Permission sought to share information with : Case Manager, Magazine features editor, Family Supports Permission granted to share information with : Yes, Verbal Permission Granted  Share Information with NAME: Britta Mccreedy  Permission granted to share info w AGENCY: SNF  Permission granted to share info w Relationship: daughter  Permission granted to share info w Contact Information: Britta Mccreedy [phone]  Emotional Assessment Appearance:: Appears stated age Attitude/Demeanor/Rapport: Gracious Affect (typically observed): Calm Orientation: : Oriented to Self, Oriented to Place, Oriented to  Time, Oriented to Situation Alcohol / Substance Use: Not Applicable Psych Involvement: No (comment)  Admission diagnosis:  Hypokalemia [E87.6] Acute exacerbation of CHF (congestive heart failure) (HCC) [I50.9] AKI (acute kidney injury) (HCC) [N17.9] Swelling of lower extremity [M79.89] Patient Active Problem List   Diagnosis Date Noted  . Hypokalemia 04/06/2020  . Benign essential HTN 04/06/2020  . Hypothyroid 04/06/2020  .  Acute exacerbation of CHF (congestive heart failure) ([place]) 04/06/2020  . CKD (chronic kidney disease), stage III 04/06/2020  . Bradycardia 04/06/2020    PCP:  Patient, No Pcp Per Pharmacy:   [id number] [address], [place] [address] Phone: [phone] Fax: [phone]     Social Determinants of Health (SDOH) Interventions    Readmission Risk Interventions No flowsheet data found.

## 2020-04-13 NOTE — TOC Progression Note (Addendum)
Transition of Care [facility]) - Progression Note    Patient Details  Name: Latasha Norton MRN: [id number] Date of Birth: Dec 29, 1925  Transition [facility]) CM/SW Contact  Terrial Rhodes, LCSWA Phone Number: 04/13/2020, 12:41 PM  Clinical Narrative:     CSW spoke with patient and patients daughter Britta Mccreedy. Patient and daughter both agreed SNF placement at [facility] Pending insurance authorization.  CSW faxed over clinicals to [facility] for insurance authorization. Reference number is [id number].  Insurance authorization pending. Bed offers pending.  CSW will continue to follow.    Expected Discharge Plan: Skilled Nursing Facility Barriers to Discharge: No Barriers Identified  Expected Discharge Plan and Services Expected Discharge Plan: Skilled Nursing Facility       Living arrangements for the past 2 months: Single Family Home                 DME Arranged: Walker rolling DME Agency: [employer]                   Social Determinants of Health (SDOH) Interventions    Readmission Risk Interventions No flowsheet data found.

## 2020-04-13 NOTE — Progress Notes (Signed)
Ref: Patient, No Pcp Per   Subjective:  Heart rate improving some. Episodes of sinus bradycardia and junctional rhythm continues. Heart rate in 60's with activity. Nurse to check oxygen saturation with activity. Patient refused pacemaker placement.  Objective:  Vital Signs in the last 24 hours: Temp:  [97.8 F (36.6 C)-98.4 F (36.9 C)] 97.8 F (36.6 C) (04/14 0551) Pulse Rate:  [42] 42 (04/14 0808) Cardiac Rhythm: Sinus bradycardia (04/14 0809) Resp:  [13-17] 17 (04/14 0808) BP: (129-155)/(50-62) 155/60 (04/14 0808) SpO2:  [99 %-100 %] 100 % (04/14 0551) Weight:  [61.1 kg-61.6 kg] 61.1 kg (04/14 0547)  Physical Exam: BP Readings from Last 1 Encounters:  04/13/20 (!) 155/60     Wt Readings from Last 1 Encounters:  04/13/20 61.1 kg    Weight change: 0.318 kg Body mass index is 21.76 kg/m. HEENT: [place]/AT, Eyes-Brown, PERL, EOMI, Conjunctiva-Pink, Sclera-Non-icteric Neck: No JVD, No bruit, Trachea midline. Lungs:  Clear, Bilateral. Cardiac:  Regular rhythm, normal S1 and S2, no S3. II/VI systolic murmur. Abdomen:  Soft, non-tender. BS present. Extremities:  Trace ankle edema present. No cyanosis. No clubbing. CNS: AxOx2, Cranial nerves grossly intact, moves all 4 extremities. Very hard of hearing. Skin: Warm and dry.   Intake/Output from previous day: 04/13 0701 - 04/14 0700 In: 323 [P.O.:320; I.V.:3] Out: 250 [Urine:250]    Lab Results: BMET    Component Value Date/Time   NA 140 04/11/2020 0338   NA 140 04/10/2020 0302   NA 141 04/09/2020 0346   K 5.0 04/11/2020 0338   K 4.8 04/10/2020 0302   K 3.8 04/09/2020 0346   CL 106 04/11/2020 0338   CL 105 04/10/2020 0302   CL 105 04/09/2020 0346   CO2 26 04/11/2020 0338   CO2 27 04/10/2020 0302   CO2 26 04/09/2020 0346   GLUCOSE 80 04/11/2020 0338   GLUCOSE 79 04/10/2020 0302   GLUCOSE 86 04/09/2020 0346   BUN 33 (H) 04/11/2020 0338   BUN 24 (H) 04/10/2020 0302   BUN 26 (H) 04/09/2020 0346   CREATININE 1.71 (H)  04/13/2020 0407   CREATININE 1.83 (H) 04/11/2020 0338   CREATININE 1.67 (H) 04/10/2020 0302   CALCIUM 8.7 (L) 04/11/2020 0338   CALCIUM 8.9 04/10/2020 0302   CALCIUM 8.8 (L) 04/09/2020 0346   GFRNONAA 26 (L) 04/13/2020 0407   GFRNONAA 24 (L) 04/11/2020 0338   GFRNONAA 26 (L) 04/10/2020 0302   GFRAA 30 (L) 04/13/2020 0407   GFRAA 27 (L) 04/11/2020 0338   GFRAA 30 (L) 04/10/2020 0302   CBC    Component Value Date/Time   WBC 4.6 04/06/2020 1213   RBC 3.60 (L) 04/06/2020 1213   HGB 10.7 (L) 04/06/2020 1213   HCT 33.9 (L) 04/06/2020 1213   PLT 204 04/06/2020 1213   MCV 94.2 04/06/2020 1213   MCH 29.7 04/06/2020 1213   MCHC 31.6 04/06/2020 1213   RDW 17.1 (H) 04/06/2020 1213   HEPATIC Function Panel Recent Labs    04/06/20 1653 04/11/20 0338  PROT 8.2* 6.7   HEMOGLOBIN A1C No components found for: HGA1C,  MPG CARDIAC ENZYMES No results found for: CKTOTAL, CKMB, CKMBINDEX, TROPONINI BNP No results for input(s): PROBNP in the last 8760 hours. TSH Recent Labs    04/07/20 0502 04/11/20 0338  TSH 137.224* 121.420*   CHOLESTEROL Recent Labs    04/07/20 0502  CHOL 298*    Scheduled Meds: . cholecalciferol  400 Units Oral Daily  . furosemide  20 mg Oral Daily  .  heparin injection (subcutaneous)  5,000 Units Subcutaneous Q8H  . hydrALAZINE  50 mg Oral TID  . levothyroxine  150 mcg Oral Daily  . levothyroxine  25 mcg Oral Once  . sodium chloride flush  3 mL Intravenous Q12H  . vitamin A  10,000 Units Oral Daily  . vitamin E  100 Units Oral Daily   Continuous Infusions: . sodium chloride     PRN Meds:.sodium chloride, acetaminophen, acetaminophen, diclofenac Sodium, diphenhydrAMINE, fluticasone, ondansetron (ZOFRAN) IV, sodium chloride flush  Assessment/Plan: Acute systolic left heart failure, compensated Hypothyroidism Sick sinus syndrome Hypertension Junction rhythm Non-sustained VT Seasonal allergy  Continue thyroid replacement Continue  hydralazine. Appreciate EP consult. Increase activity.   LOS: 7 days   Time spent including chart review, lab review, examination, discussion with patient, consultant and nurse : 30 min   Orpah Cobb  MD  04/13/2020, 10:24 AM

## 2020-04-13 NOTE — NC FL2 (Deleted)
[place] MEDICAID FL2 LEVEL OF CARE SCREENING TOOL     IDENTIFICATION  Patient Name: Latasha Norton Birthdate: 1925-04-05 Sex: female Admission Date (Current Location): 04/06/2020  [facility] and [place] Number:  Producer, television/film/video and Address:  [facility], [address], [place], [place] [address]      Provider Number: [id number]  Attending Physician Name and Address:  Orpah Cobb, MD  Relative Name and Phone Number:  Britta Mccreedy [phone]    Current Level of Care: Hospital Recommended Level of Care: Skilled Nursing Facility Prior Approval Number:    Date Approved/Denied: 04/13/20 PASRR Number: [id number]  Discharge Plan: SNF    Current Diagnoses: Patient Active Problem List   Diagnosis Date Noted  . Hypokalemia 04/06/2020  . Benign essential HTN 04/06/2020  . Hypothyroid 04/06/2020  . Acute exacerbation of CHF (congestive heart failure) (HCC) 04/06/2020  . CKD (chronic kidney disease), stage III 04/06/2020  . Bradycardia 04/06/2020    Orientation RESPIRATION BLADDER Height & Weight     Self, Time, Situation, Place  O2(Nasal Cannula 2 Liters) Continent Weight: 134 lb 12.8 oz (61.1 kg) Height:  5\' 6"  (167.6 cm)  BEHAVIORAL SYMPTOMS/MOOD NEUROLOGICAL BOWEL NUTRITION STATUS      Continent Diet(See Discharge Summary)  AMBULATORY STATUS COMMUNICATION OF NEEDS Skin   Limited Assist Verbally Other (Comment)(Appropriate for Ethnicity, Dry Intact Skin Turgor non-tenting)                       Personal Care Assistance Level of Assistance  Bathing, Feeding, Dressing Bathing Assistance: Limited assistance Feeding assistance: Independent(able to feed self) Dressing Assistance: Limited assistance     Functional Limitations Info  Sight, Hearing, Speech Sight Info: Adequate Hearing Info: Adequate Speech Info: Adequate    SPECIAL CARE FACTORS FREQUENCY  PT (By licensed PT), OT (By licensed OT)     PT Frequency: 5x min weekly OT  Frequency: 5x min weekly            Contractures Contractures Info: Not present    Additional Factors Info  Code Status Code Status Info: FULL             Current Medications (04/13/2020):  This is the current hospital active medication list Current Facility-Administered Medications  Medication Dose Route Frequency Provider Last Rate Last Admin  . 0.9 %  sodium chloride infusion  250 mL Intravenous PRN 04/15/2020, MD      . acetaminophen (TYLENOL) tablet 650 mg  650 mg Oral Q4H PRN Rometta Emery, MD      . acetaminophen (TYLENOL) tablet 650 mg  650 mg Oral QHS PRN Rometta Emery, MD   650 mg at 04/12/20 2158  . cholecalciferol (VITAMIN D3) tablet 400 Units  400 Units Oral Daily 2159, MD   400 Units at 04/13/20 0929  . diclofenac Sodium (VOLTAREN) 1 % topical gel 1 application  1 application Topical Daily PRN 04/15/20, MD   1 application at 04/13/20 0929  . diphenhydrAMINE (BENADRYL) capsule 25 mg  25 mg Oral QHS PRN 04/15/20, MD   25 mg at 04/11/20 2104  . fluticasone (FLONASE) 50 MCG/ACT nasal spray 1 spray  1 spray Each Nare Daily PRN 2105 L, MD      . furosemide (LASIX) tablet 20 mg  20 mg Oral Daily Earlie Lou, MD   20 mg at 04/13/20 0929  . heparin injection 5,000 Units  5,000 Units Subcutaneous Q8H [place],  Vicenta Aly, MD   5,000 Units at 04/13/20 0607  . hydrALAZINE (APRESOLINE) tablet 50 mg  50 mg Oral TID Dixie Dials, MD   50 mg at 04/13/20 0928  . levothyroxine (SYNTHROID) tablet 150 mcg  150 mcg Oral Daily Dixie Dials, MD   150 mcg at 04/13/20 0606  . levothyroxine (SYNTHROID) tablet 25 mcg  25 mcg Oral Once Dixie Dials, MD   Stopped at 04/12/20 1044  . ondansetron (ZOFRAN) injection 4 mg  4 mg Intravenous Q6H PRN Jonelle Sidle, Mohammad L, MD      . sodium chloride flush (NS) 0.9 % injection 3 mL  3 mL Intravenous Q12H Elwyn Reach, MD   3 mL at 04/13/20 0929  . sodium chloride flush (NS) 0.9 % injection 3 mL  3 mL  Intravenous PRN Elwyn Reach, MD      . vitamin A capsule 10,000 Units  10,000 Units Oral Daily Elwyn Reach, MD   10,000 Units at 04/13/20 0928  . vitamin E capsule 100 Units  100 Units Oral Daily Elwyn Reach, MD   100 Units at 04/13/20 7673     Discharge Medications: Please see discharge summary for a list of discharge medications.  Relevant Imaging Results:  Relevant Lab Results:   Additional Information [id number]  Trula Ore, LCSWA

## 2020-04-14 LAB — SARS CORONAVIRUS 2 (TAT 6-24 HRS): SARS Coronavirus 2: NEGATIVE

## 2020-04-14 NOTE — Progress Notes (Signed)
  Mobility Specialist Criteria Algorithm Info. Mobility Team:  [facility] elevated:Self regulated Activity: Ambulated in hall (In chair before and after ambulation) Range of motion: Active;All extremities Level of assistance: Standby assist, set-up cues, supervision of patient - no hands on Assistive device: Front wheel walker Minutes sitting in chair:  Minutes stood: 5 minutes Minutes ambulated: 5 minutes Distance ambulated (ft): 100 ft Mobility response: Tolerated well;Other (Comment) (Need cues for hand placement ) Bed Position: Chair (Recliner chair)      04/14/2020 2:42 PM'

## 2020-04-14 NOTE — TOC Progression Note (Signed)
Transition of Care [facility]) - Progression Note    Patient Details  Name: Latasha Norton MRN: [id number] Date of Birth: 08-May-1925  [facility]) CM/SW Contact  Terrial Rhodes, LCSWA Phone Number: 04/14/2020, 4:30 PM  Clinical Narrative:     CSW received insurance authorization for patient. Authorization # [id number]. CSW called [facility]. [facility] will have bed ready for patient tomorrow 4/16.   CSW will continue to follow.    Expected Discharge Plan: Skilled Nursing Facility Barriers to Discharge: No Barriers Identified  Expected Discharge Plan and Services Expected Discharge Plan: Skilled Nursing Facility       Living arrangements for the past 2 months: Single Family Home                 DME Arranged: Walker rolling DME Agency: [employer]                   Social Determinants of Health (SDOH) Interventions    Readmission Risk Interventions No flowsheet data found.

## 2020-04-14 NOTE — Progress Notes (Signed)
Physical Therapy Treatment Patient Details Name: Latasha Norton MRN: [id number] DOB: [birth year] Today's Date: 04/14/2020    History of Present Illness 86 yo female with onset of acute CHF was admitted for findings of bradycardia d/t hypothyroidism, had non sustained VT, and noted HTN, central venous congestion on xray of chest.  Replenished K+, hydrating.  PMHx:  CHF, HTN, thyroid disease, cardiomegaly,     PT Comments    Pt admitted with above diagnosis. Pt was able to ambulate with RW with min guard assist in room to bathroom and back to the chair. She says she walks short distances at home. Pt also performed some exercises. Pt with good overall safety awareness with RW. Wants continued therapy for progression of actvitiy.   Pt currently with functional limitations due to balance and endurance deficits. Pt will benefit from skilled PT to increase their independence and safety with mobility to allow discharge to the venue listed below.     Follow Up Recommendations  SNF     Equipment Recommendations  None recommended by PT    Recommendations for Other Services       Precautions / Restrictions Precautions Precautions: Fall;Other (comment)(monitor HR) Precaution Comments: bradycardia, ck sats Restrictions Weight Bearing Restrictions: No    Mobility  Bed Mobility               General bed mobility comments: in chair on arrival  Transfers Overall transfer level: Needs assistance Equipment used: Rolling walker (2 wheeled);1 person hand held assist Transfers: Sit to/from Stand Sit to Stand: Min guard         General transfer comment: min guard to steady   Ambulation/Gait Ambulation/Gait assistance: Min guard Gait Distance (Feet): 45 Feet Assistive device: Rolling walker (2 wheeled);1 person hand held assist Gait Pattern/deviations: Step-through pattern;Trunk flexed;Decreased stride length Gait velocity: reduced Gait velocity interpretation: <1.31 ft/sec, indicative  of household ambulator General Gait Details: pt is requiring help to navigate crowded environment with lines. Pt was able to ambulate to bathroom. She was able to clean herself after BM and urinating.  Washed hands at sink as well.  Gait steady with RW. Does maintain a flexed posture.     Stairs             Wheelchair Mobility    Modified Rankin (Stroke Patients Only)       Balance Overall balance assessment: Needs assistance Sitting-balance support: Feet supported;No upper extremity supported Sitting balance-Leahy Scale: Fair     Standing balance support: Bilateral upper extremity supported;During functional activity Standing balance-Leahy Scale: Poor Standing balance comment: relies on UE support for balance.                            Cognition Arousal/Alertness: Awake/alert Behavior During Therapy: WFL for tasks assessed/performed Overall Cognitive Status: Within Functional Limits for tasks assessed                                 General Comments: answering questions appropriately      Exercises General Exercises - Lower Extremity Long Arc Quad: AROM;Both;10 reps;Seated Hip Flexion/Marching: AROM;Both;10 reps;Seated    General Comments General comments (skin integrity, edema, etc.): HR 45-84 bpm with activity. Sats >93% on RA with activity      Pertinent Vitals/Pain Pain Assessment: No/denies pain    Home Living  Prior Function            PT Goals (current goals can now be found in the care plan section) Acute Rehab PT Goals Patient Stated Goal: to go to granddaughter's house Progress towards PT goals: Progressing toward goals    Frequency    Min 2X/week      PT Plan Current plan remains appropriate;Frequency needs to be updated    Co-evaluation              AM-PAC PT "6 Clicks" Mobility   Outcome Measure  Help needed turning from your back to your side while in a flat bed  without using bedrails?: None Help needed moving from lying on your back to sitting on the side of a flat bed without using bedrails?: A Little Help needed moving to and from a bed to a chair (including a wheelchair)?: A Little Help needed standing up from a chair using your arms (e.g., wheelchair or bedside chair)?: A Little Help needed to walk in hospital room?: A Little Help needed climbing 3-5 steps with a railing? : A Lot 6 Click Score: 18    End of Session Equipment Utilized During Treatment: Gait belt Activity Tolerance: Patient tolerated treatment well;Patient limited by fatigue;Treatment limited secondary to medical complications (Comment) Patient left: with call bell/phone within reach;in chair;with chair alarm set Nurse Communication: Mobility status PT Visit Diagnosis: Unsteadiness on feet (R26.81);Muscle weakness (generalized) (M62.81);Difficulty in walking, not elsewhere classified (R26.2)     Time: 8889-1694 PT Time Calculation (min) (ACUTE ONLY): 18 min  Charges:  $Gait Training: 8-22 mins                     Eliyahu Bille W,PT Acute Rehabilitation Services Pager:  [phone]  Office:  [phone]     Berline Lopes 04/14/2020, 2:11 PM

## 2020-04-14 NOTE — Progress Notes (Signed)
Patient had a 4.55 and 4.12 sec pause this morning.  MD was made aware of this.

## 2020-04-14 NOTE — Progress Notes (Signed)
Ref: Patient, No Pcp Per   Subjective:  Episodes of sinus pauses, junctional rhythm and sinus bradycardia.  Patient still refuses pacemaker. She is awaiting SNF placement.  Objective:  Vital Signs in the last 24 hours: Temp:  [98.1 F (36.7 C)-98.9 F (37.2 C)] 98.3 F (36.8 C) (04/15 1334) Pulse Rate:  [43-93] 60 (04/15 1334) Cardiac Rhythm: Sinus bradycardia;Bundle branch block (04/15 0900) Resp:  [14-21] 21 (04/15 1334) BP: (133-158)/(52-68) 158/68 (04/15 1334) SpO2:  [92 %-100 %] 97 % (04/15 1334) Weight:  [62 kg] 62 kg (04/15 0431)  Physical Exam: BP Readings from Last 1 Encounters:  04/14/20 (!) 158/68     Wt Readings from Last 1 Encounters:  04/14/20 62 kg    Weight change: 0.401 kg Body mass index is 22.06 kg/m. HEENT: [place]/AT, Eyes-Brown, PERL, EOMI, Conjunctiva-Pink, Sclera-Non-icteric Neck: No JVD, No bruit, Trachea midline. Lungs:  Clear, Bilateral. Cardiac:  Regular rhythm, normal S1 and S2, no S3. II/VI systolic murmur. Abdomen:  Soft, non-tender. BS present. Extremities:  Trace edema present. No cyanosis. No clubbing. CNS: AxOx2, Hard of hearing. Cranial nerves grossly intact, moves all 4 extremities.  Skin: Warm and dry.   Intake/Output from previous day: 04/14 0701 - 04/15 0700 In: 686 [P.O.:680; I.V.:6] Out: 200 [Urine:200]    Lab Results: BMET    Component Value Date/Time   NA 140 04/11/2020 0338   NA 140 04/10/2020 0302   NA 141 04/09/2020 0346   K 5.0 04/11/2020 0338   K 4.8 04/10/2020 0302   K 3.8 04/09/2020 0346   CL 106 04/11/2020 0338   CL 105 04/10/2020 0302   CL 105 04/09/2020 0346   CO2 26 04/11/2020 0338   CO2 27 04/10/2020 0302   CO2 26 04/09/2020 0346   GLUCOSE 80 04/11/2020 0338   GLUCOSE 79 04/10/2020 0302   GLUCOSE 86 04/09/2020 0346   BUN 33 (H) 04/11/2020 0338   BUN 24 (H) 04/10/2020 0302   BUN 26 (H) 04/09/2020 0346   CREATININE 1.71 (H) 04/13/2020 0407   CREATININE 1.83 (H) 04/11/2020 0338   CREATININE 1.67 (H)  04/10/2020 0302   CALCIUM 8.7 (L) 04/11/2020 0338   CALCIUM 8.9 04/10/2020 0302   CALCIUM 8.8 (L) 04/09/2020 0346   GFRNONAA 26 (L) 04/13/2020 0407   GFRNONAA 24 (L) 04/11/2020 0338   GFRNONAA 26 (L) 04/10/2020 0302   GFRAA 30 (L) 04/13/2020 0407   GFRAA 27 (L) 04/11/2020 0338   GFRAA 30 (L) 04/10/2020 0302   CBC    Component Value Date/Time   WBC 4.6 04/06/2020 1213   RBC 3.60 (L) 04/06/2020 1213   HGB 10.7 (L) 04/06/2020 1213   HCT 33.9 (L) 04/06/2020 1213   PLT 204 04/06/2020 1213   MCV 94.2 04/06/2020 1213   MCH 29.7 04/06/2020 1213   MCHC 31.6 04/06/2020 1213   RDW 17.1 (H) 04/06/2020 1213   HEPATIC Function Panel Recent Labs    04/06/20 1653 04/11/20 0338  PROT 8.2* 6.7   HEMOGLOBIN A1C No components found for: HGA1C,  MPG CARDIAC ENZYMES No results found for: CKTOTAL, CKMB, CKMBINDEX, TROPONINI BNP No results for input(s): PROBNP in the last 8760 hours. TSH Recent Labs    04/07/20 0502 04/11/20 0338  TSH 137.224* 121.420*   CHOLESTEROL Recent Labs    04/07/20 0502  CHOL 298*    Scheduled Meds: . cholecalciferol  400 Units Oral Daily  . furosemide  20 mg Oral Daily  . heparin injection (subcutaneous)  5,000 Units Subcutaneous Q8H  .  hydrALAZINE  50 mg Oral TID  . levothyroxine  150 mcg Oral Daily  . levothyroxine  25 mcg Oral Once  . sodium chloride flush  3 mL Intravenous Q12H  . vitamin A  10,000 Units Oral Daily  . vitamin E  100 Units Oral Daily   Continuous Infusions: . sodium chloride     PRN Meds:.sodium chloride, acetaminophen, acetaminophen, diclofenac Sodium, diphenhydrAMINE, fluticasone, ondansetron (ZOFRAN) IV, sodium chloride flush  Assessment/Plan: Acute systolic left heart failure, compensated Sick sinus syndrome Hypothyroidism HTN Junctional rhythm Non-sustained VT Seasonal allergy  Awaiting SNF placement.   LOS: 8 days   Time spent including chart review, lab review, examination, discussion with patient, Arts development officer and nurse : 30 min   Orpah Cobb  MD  04/14/2020, 2:26 PM

## 2020-04-15 MED ORDER — LEVOTHYROXINE SODIUM 150 MCG PO TABS: 150.0000 ug | ORAL_TABLET | Freq: Every day | ORAL | 6 refills | Status: DC

## 2020-04-15 MED ORDER — DIPHENHYDRAMINE HCL 25 MG PO CAPS: 25.0000 mg | ORAL_CAPSULE | Freq: Every evening | ORAL | 0 refills | Status: DC | PRN

## 2020-04-15 MED ORDER — FUROSEMIDE 20 MG PO TABS: 20.0000 mg | ORAL_TABLET | Freq: Every day | ORAL | 6 refills | Status: DC

## 2020-04-15 MED ORDER — ACETAMINOPHEN 325 MG PO TABS: 650.0000 mg | ORAL_TABLET | Freq: Every evening | ORAL | 1 refills | Status: AC | PRN

## 2020-04-15 MED ORDER — HYDRALAZINE HCL 50 MG PO TABS: 50.0000 mg | ORAL_TABLET | Freq: Three times a day (TID) | ORAL | 6 refills | Status: DC

## 2020-04-15 NOTE — TOC Transition Note (Signed)
Transition of Care [facility]) - CM/SW Discharge Note   Patient Details  Name: Latasha Norton MRN: [id number] Date of Birth: 07-Feb-1925  Transition of [facility]) CM/SW Contact:  Terrial Rhodes, LCSWA Phone Number: 04/15/2020, 10:54 AM   Clinical Narrative:     Patient will DC to: [facility]   Anticipated DC date: 04/15/2020  Family notified: Britta Mccreedy  Transport by: Sharin Mons  ?  Per MD patient ready for DC to [facility] . RN, patient, patient's family, and facility notified of DC. Discharge Summary sent to facility. RN given number for report tele# [phone]. DC packet on chart. Ambulance transport requested for patient.  CSW signing off.    Final next level of care: Skilled Nursing Facility Barriers to Discharge: No Barriers Identified   Patient Goals and CMS Choice Patient states their goals for this hospitalization and ongoing recovery are:: to go to skilled nursing facility CMS Medicare.gov Compare Post Acute Care list provided to:: Patient Choice offered to / list presented to : Patient  Discharge Placement              Patient chooses bed at: [facility] Patient to be transferred to facility by: [facility] Name of family member notified: Britta Mccreedy Patient and family notified of of transfer: 04/15/20  Discharge Plan and Services                DME Arranged: Dan Humphreys rolling DME Agency: [employer]                  Social Determinants of Health (SDOH) Interventions     Readmission Risk Interventions No flowsheet data found.

## 2020-04-15 NOTE — Discharge Summary (Signed)
Physician Discharge Summary  Patient ID: Latasha Norton MRN: [id number] DOB/AGE: 1925/11/28 86 y.o.  Admit date: 04/06/2020 Discharge date: 04/15/2020  Admission Diagnoses: Acute systolic left heart failure Hypertension Hypothyroidism Hypokalemia  Discharge Diagnoses:  Principal Problem:   Acute systolic left heart failure (HCC) Active Problems:   Sick sinus syndrome   Junctional rhythm, paroxysmal   Non-sustained VT   Hypokalemia   Benign essential HTN   Hypothyroidism   CKD (chronic kidney disease), stage III   Bradycardia  Discharged Condition: fair  Hospital Course: 86 years old black female with PMH of hypertension, hypothyroidism, S/P radioactive I-131 treatment in 05/2000 had lower extremities edema and shortness of breath with activity. Patient was found to have severe bradycardia and junctional rhythm with significant hypokalemia. She had IV lasix with fair diuresis and potassium supplementation.  She had stopped taking levothyroxine for 3-4 months and her TSH was elevated at 137.224 micro IU. Her levothyroxine dose was resumed and increased to 150 mcg daily.  Her echocardiogram showed septal hypokinesia and mild MR and TR. Chest x-ray showed mild vascular congestion and Troponin I levels were minimally elevated. EP consult was obtained for possible permanent pacemaker placement for episodes of sinus pauses and severe bradycardia. Patient declined the procedure.  Here medications were adjusted with improvement in Heart rate in 30-40's at rest and 40 to 50's bpm with activity with good LV systolic. Her oxygen saturations gradually improved and she maintained 95 % O2 saturation on room air on day of discharge. She was sent to area nursing home and will see in 1 week and 1 month to re-evaluate thyroid function and CHF.  Consults: cardiology  Significant Diagnostic Studies: labs: Normal WBC count and Platelets count. Hgb down to 10.7 gm. Potassium 2.9 mmol on admission and 5.0  on day of discharge. Creatinine 1.91 mg. TSH was 137.224 on admission and minimally down to 121.420 micro IU in 4 days.  LDL cholesterol of 197 mg on admission is expected to improve post thyroid supplementation. SARS coronavirus 2 test was negative on 04/06/20 and 04/14/2020.,  EKG: Junctional rhythm with LBBB.  Chest x-ray: Cardiomegaly and vascular congestion. Marked elevation of left hemidiaphragm.  Echocardiogram: Mild LV systolic dysfunction with septal hypokinesia, mild AI, MR and TR and mild AS.  Treatments: cardiac meds: furosemide and hydralazine.   Discharge Exam: Blood pressure (!) 162/60, pulse (!) 45, temperature 98.4 F (36.9 C), temperature source Oral, resp. rate 19, height 5\' 6"  (1.676 m), weight 61.6 kg, SpO2 98 %. General appearance: alert, cooperative and appears stated age. Head: Normocephalic, atraumatic. Eyes: Brown eyes, pink conjunctiva, corneas clear. PERRL, EOM's intact.  Neck: No adenopathy, no carotid bruit, no JVD, supple, symmetrical, trachea midline and thyroid not enlarged. Resp: Clearing to auscultation bilaterally. Cardio: Irregular rate and rhythm, bradycardia, S1, S2 normal, II/VI systolic murmur, no click, rub or gallop. GI: Soft, non-tender; bowel sounds normal; no organomegaly. Extremities: 1 + ankle edema, no cyanosis or clubbing. Skin: Warm and dry.  Neurologic: Alert and oriented X 2, normal strength and tone. Normal coordination and slow gait with walker.  Disposition: Discharge disposition: 03-Skilled Nursing Facility        Allergies as of 04/15/2020   No Known Allergies     Medication List    STOP taking these medications   amLODipine 10 MG tablet Commonly known as: NORVASC   hydrochlorothiazide 12.5 MG capsule Commonly known as: MICROZIDE     TAKE these medications   acetaminophen 325 MG tablet Commonly known  as: TYLENOL Take 2 tablets (650 mg total) by mouth at bedtime as needed (insomnia).   diclofenac Sodium 1 %  Gel Commonly known as: VOLTAREN Apply 1 application topically daily as needed for pain.   diphenhydrAMINE 25 mg capsule Commonly known as: BENADRYL Take 1 capsule (25 mg total) by mouth at bedtime as needed for sleep.   fluticasone 50 MCG/ACT nasal spray Commonly known as: FLONASE Place 1 spray into both nostrils daily as needed for congestion.   furosemide 20 MG tablet Commonly known as: LASIX Take 1 tablet (20 mg total) by mouth daily. Start taking on: April 16, 2020   hydrALAZINE 50 MG tablet Commonly known as: APRESOLINE Take 1 tablet (50 mg total) by mouth 3 (three) times daily. What changed:   medication strength  how much to take   levothyroxine 150 MCG tablet Commonly known as: SYNTHROID Take 1 tablet (150 mcg total) by mouth daily. Start taking on: April 16, 2020 What changed:   medication strength  how much to take   VITAMIN A PO Take 1 tablet by mouth daily.   VITAMIN D PO Take 1 tablet by mouth daily.   VITAMIN E PO Take 1 tablet by mouth daily.            Durable Medical Equipment  (From admission, onward)         Start     Ordered   04/12/20 1455  For home use only DME Walker rolling  Once    Question Answer Comment  Walker: With [place] Wheels   Patient needs a walker to treat with the following condition Weakness      04/12/20 1454          Contact information for follow-up providers    Dixie Dials, MD. Schedule an appointment as soon as possible for a visit in 1 week(s).   Specialty: Cardiology Contact information: [place] [address] [phone]            Contact information for after-discharge care    Destination    [facility] .   Service: Skilled Nursing Contact information: [address] [place] [phone]                  Time spent: Review of old chart, current chart, lab, x-ray, cardiac tests and discussion with  patient over 60 minutes.  Signed: Birdie Riddle 04/15/2020, 10:59 AM

## 2020-04-15 NOTE — Progress Notes (Signed)
Anticipated transport by [facility] to [facility] at 1300.  Son notified at [phone].  Daughter did not answer call.

## 2020-04-18 ENCOUNTER — Encounter: Payer: Self-pay | Admitting: Adult Health

## 2020-04-18 ENCOUNTER — Non-Acute Institutional Stay (SKILLED_NURSING_FACILITY): Payer: Medicare Other | Admitting: Adult Health

## 2020-04-18 DIAGNOSIS — R5381 Other malaise: Secondary | ICD-10-CM

## 2020-04-18 DIAGNOSIS — I129 Hypertensive chronic kidney disease with stage 1 through stage 4 chronic kidney disease, or unspecified chronic kidney disease: Secondary | ICD-10-CM

## 2020-04-18 DIAGNOSIS — E039 Hypothyroidism, unspecified: Secondary | ICD-10-CM

## 2020-04-18 DIAGNOSIS — J301 Allergic rhinitis due to pollen: Secondary | ICD-10-CM

## 2020-04-18 DIAGNOSIS — I495 Sick sinus syndrome: Secondary | ICD-10-CM | POA: Diagnosis not present

## 2020-04-18 DIAGNOSIS — I5023 Acute on chronic systolic (congestive) heart failure: Secondary | ICD-10-CM | POA: Diagnosis not present

## 2020-04-18 NOTE — Progress Notes (Signed)
Location:  [facility] Room Number: 311-A Place of Service:  SNF (31) Provider:  Kenard GowerMedina-Vargas, Lyriq Finerty, DNP, FNP-BC  Patient Care Team: Patient, No Pcp Per as PCP - General (General Practice)  Extended Emergency Contact Information Primary Emergency Contact: Davis,Barbara Address: [address]          [place], [place] [address] [place] Home Phone: [phone] Relation: None  Code Status:  Full Code  Goals of care: Advanced Directive information Advanced Directives 04/06/2020  Does Patient Have a Medical Advance Directive? No  Would patient like information on creating a medical advance directive? No - Patient declined     Chief Complaint  Patient presents with  . Acute Visit    Hospital followup, status [facility] for CHF exacerbation    HPI:  Pt is a 86 y.o. female who was [facility]. She has a PMH of hypertension, hypothyroidism, S/P radioactive I-131 on 05/2000. She was having progressive lower extremity edema and shortness of breath.  She was found to have severe bradycardia and junctional rhythm with significant hypokalemia.  IV Lasix and potassium supplementation was done in the hospital.  She stopped taking levothyroxine for 3 to 4 months and had TSH was elevated at 137.224 IU.  She was restarted on an increased levothyroxine 150 mcg daily.  Echocardiogram showed septal hypokinesia and mild MR and TR.  Chest x-ray showed mild vascular congestion and troponin I levels were minimally elevated.  EP consult was obtained for possible permanent pacemaker placement for episodes of sinus pauses and severe bradycardia.  Patient declined the procedure.  Her medications were adjusted with improvement in heart rate in 30s to 40s at rest and 40 to 50s bpm with activity.  Her O2 sats gradually improved and she maintained 95% O2  saturation on room air.  [facility].    Past Medical History:  Diagnosis Date  . CHF (congestive heart failure) (HCC)   . Hypertension   . SSS (sick sinus syndrome) (HCC)   . Thyroid disease    No past surgical history on file.  No Known Allergies  Outpatient Encounter Medications as of 04/18/2020  Medication Sig  . acetaminophen (TYLENOL) 325 MG tablet Take 2 tablets (650 mg total) by mouth at bedtime as needed (insomnia).  . bisacodyl (DULCOLAX) 10 MG suppository Place 10 mg rectally daily as needed for moderate constipation.  . cholecalciferol (VITAMIN D) 25 MCG (1000 UNIT) tablet Take 1,000 Units by mouth daily.  . diclofenac Sodium (VOLTAREN) 1 % GEL Apply 1 application topically daily as needed for pain.  . diphenhydrAMINE (BENADRYL) 25 mg capsule Take 1 capsule (25 mg total) by mouth at bedtime as needed for sleep.  . fluticasone (FLONASE) 50 MCG/ACT nasal spray Place 1 spray into both nostrils daily as needed for congestion.  . furosemide (LASIX) 20 MG tablet Take 1 tablet (20 mg total) by mouth daily.  . hydrALAZINE (APRESOLINE) 50 MG tablet Take 1 tablet (50 mg total) by mouth 3 (three) times daily.  Mar[place] Kitchen. levothyroxine (SYNTHROID) 150 MCG tablet Take 1 tablet (150 mcg total) by mouth daily.  . magnesium hydroxide (MILK OF MAGNESIA) 400 MG/5ML suspension Take 30 mLs by mouth daily as needed for mild constipation.  . SODIUM PHOSPHATES RE Place 1 each rectally daily as needed.  . [DISCONTINUED] VITAMIN A PO Take 1 tablet by mouth daily.   . [  DISCONTINUED] VITAMIN D PO Take 1 tablet by mouth daily.   . [DISCONTINUED] VITAMIN E PO Take 1 tablet by mouth daily.    No facility-administered encounter medications on file as of 04/18/2020.    Review of Systems  GENERAL: No change in appetite, no fatigue, no weight changes, no fever, chills or weakness MOUTH and THROAT: Denies oral discomfort, gingival pain or bleeding, pain from teeth or  hoarseness   RESPIRATORY: no cough, SOB, DOE, wheezing, hemoptysis CARDIAC: No chest pain or palpitations GI: No abdominal pain, diarrhea, constipation, heart burn, nausea or vomiting GU: Denies dysuria, frequency, hematuria, incontinence, or discharge NEUROLOGICAL: Denies dizziness, syncope, numbness, or headache PSYCHIATRIC: Denies feelings of depression or anxiety. No report of hallucinations, insomnia, paranoia, or agitation     There is no immunization history on file for this patient. Pertinent  Health Maintenance Due  Topic Date Due  . DEXA SCAN  Never done  . PNA vac Low Risk Adult (1 of 2 - PCV13) Never done  . INFLUENZA VACCINE  07/31/2020    Vitals:   04/18/20 1017 04/18/20 1018  BP: (!) 166/72 (!) 160/77  Pulse: 69   Resp: 18   Temp: 98.6 F (37 C)   TempSrc: Oral   SpO2: 94%   Weight: 133 lb (60.3 kg)   Height: 5\' 6"  (1.676 m)    Body mass index is 21.47 kg/m.  Physical Exam  GENERAL APPEARANCE: Well nourished. In no acute distress. Normal body habitus SKIN:  Skin is warm and dry.  MOUTH and THROAT: Lips are without lesions. Oral mucosa is moist and without lesions. Tongue is normal in shape, size, and color and without lesions RESPIRATORY: Breathing is even & unlabored, BS CTAB CARDIAC: RRR, no murmur,no extra heart sounds, BLE1+ edema GI: Abdomen soft, normal BS, no masses, no tenderness NEUROLOGICAL: There is no tremor. Speech is clear. Alert and oriented X 3. PSYCHIATRIC:  Affect and behavior are appropriate  Labs reviewed: Recent Labs    04/06/20 1653 04/07/20 0502 04/09/20 0346 04/09/20 0346 04/10/20 0302 04/11/20 0338 04/13/20 0407  NA  --    < > 141  --  140 140  --   K  --    < > 3.8  --  4.8 5.0  --   CL  --    < > 105  --  105 106  --   CO2  --    < > 26  --  27 26  --   GLUCOSE  --    < > 86  --  79 80  --   BUN  --    < > 26*  --  24* 33*  --   CREATININE  --    < > 1.77*   < > 1.67* 1.83* 1.71*  CALCIUM  --    < > 8.8*  --  8.9  8.7*  --   MG 2.6*  --   --   --  2.2  --   --    < > = values in this interval not displayed.   Recent Labs    04/06/20 1653 04/11/20 0338  AST 123* 82*  ALT 44 33  ALKPHOS 31* 23*  BILITOT 0.7 0.3  PROT 8.2* 6.7  ALBUMIN 3.8 3.0*   Recent Labs    04/06/20 1213  WBC 4.6  HGB 10.7*  HCT 33.9*  MCV 94.2  PLT 204   Lab Results  Component Value Date   TSH  121.420 (H) 04/11/2020    Lab Results  Component Value Date   CHOL 298 (H) 04/07/2020   HDL 83 04/07/2020   LDLCALC 197 (H) 04/07/2020   TRIG 89 04/07/2020   CHOLHDL 3.6 04/07/2020    Significant Diagnostic Results in last 30 days:  DG Chest 2 View  Result Date: 04/06/2020 CLINICAL DATA:  Chest pain, short of breath EXAM: CHEST - 2 VIEW COMPARISON:  None. FINDINGS: Elevation of the LEFT hemidiaphragm. Mediastinum is shifted rightward related to the diaphragmatic elevation. Heart appears enlarged. Ectatic aorta. Mild central venous congestion. No focal infiltrate. No pneumothorax. Degenerate changes of the shoulders. IMPRESSION: 1. Cardiomegaly and central venous congestion. 2. Marked elevation of the LEFT hemidiaphragm Electronically Signed   By: Suzy Bouchard M.D.   On: 04/06/2020 12:53   ECHOCARDIOGRAM COMPLETE  Result Date: 04/07/2020    ECHOCARDIOGRAM REPORT   Patient Name:   KELIE GAINEY Date of Exam: 04/07/2020 Medical Rec #:  [id number]      Height:       66.0 in Accession #:    [id number]     Weight:       134.0 lb Date of Birth:  1925/05/05      BSA:          1.687 m Patient Age:    67 years       BP:           143/69 mmHg Patient Gender: F              HR:           58 bpm. Exam Location:  Inpatient Procedure: 2D Echo Indications:     CHF-Acute Diastolic 361.44 / R15.40  History:         Patient has no prior history of Echocardiogram examinations.                  Signs/Symptoms:dementia; Risk Factors:Hypertension.                  Hypothyroidism.  Sonographer:     Darlina Sicilian RDCS Referring Phys:  [place] Diagnosing Phys: Dixie Dials MD IMPRESSIONS  1. Left ventricular ejection fraction, by estimation, is 45 to 50%. The left ventricle has mildly decreased function. The left ventricle demonstrates regional wall motion abnormalities (see scoring diagram/findings for description). There is mild concentric left ventricular hypertrophy of the anterior and posterior segments. Left ventricular diastolic parameters are indeterminate. There is mild hypokinesis of the left ventricular, entire septal wall.  2. Right ventricular systolic function is normal. The right ventricular size is normal. There is normal pulmonary artery systolic pressure.  3. Left atrial size was mildly dilated.  4. Right atrial size was mildly dilated.  5. The mitral valve is degenerative. Mild mitral valve regurgitation.  6. The aortic valve is tricuspid. Aortic valve regurgitation is mild. Mild to moderate aortic valve stenosis.  7. There is mild (Grade II) atheroma plaque involving the aortic root and ascending aorta.  8. The inferior vena cava is normal in size with greater than 50% respiratory variability, suggesting right atrial pressure of 3 mmHg. FINDINGS  Left Ventricle: Left ventricular ejection fraction, by estimation, is 45 to 50%. The left ventricle has mildly decreased function. The left ventricle demonstrates regional wall motion abnormalities. Mild hypokinesis of the left ventricular, entire septal wall. The left ventricular internal cavity size was normal in size. There is mild concentric left ventricular hypertrophy of the anterior and posterior  segments. Left ventricular diastolic parameters are indeterminate.  LV Wall Scoring: The entire septum is hypokinetic. The entire anterior wall, entire lateral wall, entire inferior wall, and apex are normal. Right Ventricle: The right ventricular size is normal. No increase in right ventricular wall thickness. Right ventricular systolic function is normal. There is normal  pulmonary artery systolic pressure. The tricuspid regurgitant velocity is 2.04 m/s, and  with an assumed right atrial pressure of 3 mmHg, the estimated right ventricular systolic pressure is 19.6 mmHg. Left Atrium: Left atrial size was mildly dilated. Right Atrium: Right atrial size was mildly dilated. Pericardium: There is no evidence of pericardial effusion. Mitral Valve: The mitral valve is degenerative in appearance. There is mild thickening of the mitral valve leaflet(s). There is moderate calcification of the mitral valve leaflet(s). Mild to moderate mitral annular calcification. Mild mitral valve regurgitation. Tricuspid Valve: The tricuspid valve is normal in structure. Tricuspid valve regurgitation is mild. Aortic Valve: The aortic valve is tricuspid. . There is mild thickening and mild calcification of the aortic valve. Aortic valve regurgitation is mild. Aortic regurgitation PHT measures 789 msec. Mild to moderate aortic stenosis is present. Mild aortic valve annular calcification. There is mild thickening of the aortic valve. There is mild calcification of the aortic valve. Pulmonic Valve: The pulmonic valve was normal in structure. Pulmonic valve regurgitation is trivial. Aorta: The aortic root is normal in size and structure. There is mild (Grade II) atheroma plaque involving the aortic root and ascending aorta. Venous: The inferior vena cava is normal in size with greater than 50% respiratory variability, suggesting right atrial pressure of 3 mmHg. IAS/Shunts: No atrial level shunt detected by color flow Doppler.  LEFT VENTRICLE PLAX 2D LVIDd:         3.60 cm  Diastology LVIDs:         3.10 cm  LV e' lateral:   4.39 cm/s LV PW:         1.40 cm  LV E/e' lateral: 11.5 LV IVS:        1.50 cm  LV e' medial:    4.28 cm/s LVOT diam:     1.80 cm  LV E/e' medial:  11.8 LV SV:         48 LV SV Index:   29 LVOT Area:     2.54 cm  LEFT ATRIUM         Index LA diam:    3.30 cm 1.96 cm/m  AORTIC VALVE LVOT Vmax:    80.90 cm/s LVOT Vmean:  54.700 cm/s LVOT VTI:    0.189 m AI PHT:      789 msec  AORTA Ao Asc diam: 2.90 cm MITRAL VALVE               TRICUSPID VALVE MV Area (PHT): 3.51 cm    TR Peak grad:   16.6 mmHg MV Decel Time: 216 msec    TR Vmax:        204.00 cm/s MV E velocity: 50.35 cm/s MV A velocity: 28.70 cm/s  SHUNTS MV E/A ratio:  1.75        Systemic VTI:  0.19 m                            Systemic Diam: 1.80 cm Orpah Cobb MD Electronically signed by Orpah Cobb MD Signature Date/Time: 04/07/2020/12:24:40 PM    Final     Assessment/Plan  1. Acute on chronic  systolic congestive heart failure (HCC) -Was diuresed with IV Lasix and given potassium supplementation - no SOB on room air, will continue Lasix 20 mg 1 tab daily and hydralazine 50 mg 1 tab 3 times a day -Follow-up with cardiology in 1 week, Dr. Orpah Cobb  2. SSS (sick sinus syndrome) (HCC) -Found to have severe bradycardia and junctional rhythm, EP consult was obtained for possible permanent pacemaker placement for episodes of sinus pauses and severe bradycardia. However, resident declined procedure  3. Acquired hypothyroidism Lab Results  Component Value Date   TSH 121.420 (H) 04/11/2020   - She stopped taking levothyroxine for 3 to 4 months and had TSH was elevated at 137.224 IU.  She was restarted on an increased levothyroxine 150 mcg daily.  -TSH in 5 weeks  4. Benign hypertension with chronic kidney disease - BP today 183/75, elevated, will start amlodipine 5 mg 1 tab daily and continue hydralazine 50 mg 1 tab 3 times a day -Monitor BPs  5. Physical deconditioning -For PT and OT, for therapeutic strengthening exercises, fall precautions  6.  Seasonal allergic rhinitis due to pollen -Continue fluticasone 50 mcg spray 1 spray into both nostrils PRN   Family/ staff Communication:  Discussed plan of care with resident and charge nurse.  Labs/tests ordered: TSH in 5 weeks, BMP on 04/21/2020  Goals of care:    Short-term care   Kenard Gower, DNP, FNP-BC [facility] [phone] (Monday-Friday 8:00 a.m. - 5:00 p.m.) [phone] (after hours)

## 2020-04-19 ENCOUNTER — Encounter: Payer: Self-pay | Admitting: Internal Medicine

## 2020-04-19 ENCOUNTER — Non-Acute Institutional Stay (SKILLED_NURSING_FACILITY): Payer: Medicare Other | Admitting: Internal Medicine

## 2020-04-19 DIAGNOSIS — I495 Sick sinus syndrome: Secondary | ICD-10-CM

## 2020-04-19 DIAGNOSIS — N1832 Chronic kidney disease, stage 3b: Secondary | ICD-10-CM

## 2020-04-19 DIAGNOSIS — J309 Allergic rhinitis, unspecified: Secondary | ICD-10-CM

## 2020-04-19 DIAGNOSIS — I5023 Acute on chronic systolic (congestive) heart failure: Secondary | ICD-10-CM

## 2020-04-19 DIAGNOSIS — E039 Hypothyroidism, unspecified: Secondary | ICD-10-CM

## 2020-04-19 NOTE — Assessment & Plan Note (Addendum)
Recheck TSH 6--8 weeks following reinitiation of L-thyroxine. She maintains that she will be compliant with the medication.

## 2020-04-19 NOTE — Assessment & Plan Note (Signed)
Risk of Advil Sinus  due to pseudoephedrine component discussed

## 2020-04-19 NOTE — Progress Notes (Signed)
NURSING HOME LOCATION:  [facility] ROOM NUMBER: 311/A   CODE STATUS:  Full Code  PCP:  No PCP  This is a comprehensive admission note to [facility] performed on this date less than 30 days from date of admission. Included are preadmission medical/surgical history; reconciled medication list; family history; social history and comprehensive review of systems.  Corrections and additions to the records were documented. Comprehensive physical exam was also performed. Additionally a clinical summary was entered for each active diagnosis pertinent to this admission in the Problem List to enhance continuity of care.  HPI: The patient was hospitalized 4/7-4/16/2021 with acute systolic left heart failure in the context of sick sinus syndrome, paroxysmal junctional rhythm, and nonsustained VT.  She presented with lower extremity edema and exertional dyspnea.  Severe bradycardia and junctional rhythm was noted in the context of significant hypokalemia.  Potassium was supplemented and she received IV Lasix with fair diuresis. The patient significantly had stopped taking her L-thyroxine 3-4 months prior to admission; TSH was 137. She can not explain this "mix up". L-thyroxine was reinitiated and titrated. Echocardiogram revealed septal hypokinesia and mild MR and TR.  Chest x-ray revealed mild vascular congestion.  Troponin I levels were minimally elevated. EP consult was requested for possible permanent pacemaker placement for episodes of sinus pauses and severe bradycardia.  Patient declined this intervention. Meds were adjusted with improvement in heart rates to the 30s-40s at rest and 40-50s with activity.  Past medical and surgical history: Includes essential hypertension and hypothyroidism. Surgeries and procedures include radioactive by I-131 thyroid ablation in 2001.  Social history: Not surprisingly she has never smoked.  She does not drink.  She lives alone;but her son, daughter,  and granddaughter live in [place] near her.  They have  provided her a First Alert type alarm system.  Family history: No history on file.  Family history is noncontributory as she is 86 years old.   Review of systems: She was oriented x3. She describes knee pain which responds to topical diclofenac. She describes extrinsic symptoms with sneezing, nasal stuffiness, and itchy, watery eyes.  She states she has been using Advil Sinus.  This drug contains ibuprofen 200 mg, chlorpheniramine 4 mg, and pseudoephedrine 10 mg. She denies any other active symptoms and is "anxious to go home".  Constitutional: No fever, significant weight change, fatigue  Eyes: No redness, discharge, pain, vision change ENT/mouth: No nasal congestion, purulent discharge, earache, change in hearing, sore throat  Cardiovascular: No chest pain, palpitations, paroxysmal nocturnal dyspnea, claudication, edema  Respiratory: No cough, sputum production, hemoptysis, DOE, significant snoring, apnea Gastrointestinal: No heartburn, dysphagia, abdominal pain, nausea /vomiting, rectal bleeding, melena, change in bowels Genitourinary: No dysuria, hematuria, pyuria, incontinence, nocturia Musculoskeletal: No joint stiffness, joint swelling, weakness, pain Dermatologic: No rash, pruritus, change in appearance of skin Neurologic: No dizziness, headache, syncope, seizures, numbness, tingling Psychiatric: No significant anxiety, depression, insomnia, anorexia Endocrine: No change in hair/skin/nails, excessive thirst, excessive hunger, excessive urination  Hematologic/lymphatic: No significant bruising, lymphadenopathy, abnormal bleeding Allergy/immunology: No urticaria, angioedema  Physical exam:  Pertinent or positive findings: She appears much younger than her stated age.  She was profoundly hard of hearing.  She has bilateral ptosis, greater on the right than the left.  Arcus senilis is present.  She is edentulous.  Grade 1/2  systolic murmur is present at the base.  She has low-grade rales in the right lower lobe.  She has scattered bronchovesicular type breath sounds.  She does have  a supraumbilical subcutaneous mass which she states was not present prior to subcu anticoagulant injections.  Clinically ventral hernia suggested, but there is faint ecchymosis in this area.  She has extensive ecchymosis over the suprapubic area.  She has nonpitting edema of the ankles and feet.  The fifth left finger is deviated medially at the DIP joint. Great toenails are markedly deformed.  She is weak to opposition in all extremities but this appears symmetric.  General appearance: Adequately nourished; no acute distress, increased work of breathing is present.   Lymphatic: No lymphadenopathy about the head, neck, axilla. Eyes: No conjunctival inflammation or lid edema is present. There is no scleral icterus. Ears:  External ear exam shows no significant lesions or deformities.   Nose:  External nasal examination shows no deformity or inflammation. Nasal mucosa are pink and moist without lesions, exudates Oral exam: Lips and gums are healthy appearing.There is no oropharyngeal erythema or exudate. Neck:  No thyromegaly, masses, tenderness noted.    Heart:  Normal rate and regular rhythm. S1 and S2 normal without gallop, click, rub.  Lungs: without wheezes, rhonchi, rubs. Abdomen: Bowel sounds are normal.  Abdomen is soft and nontender with no organomegaly. GU: Deferred  Extremities:  No cyanosis, clubbing. Neurologic exam: Balance, Rhomberg, finger to nose testing could not be completed due to clinical state Skin: Warm & dry w/o tenting. No significant  rash.  See clinical summary under each active problem in the Problem List with associated updated therapeutic plan

## 2020-04-19 NOTE — Assessment & Plan Note (Addendum)
Clinically compensated although peripheral edema persists

## 2020-04-19 NOTE — Assessment & Plan Note (Signed)
Avoid nephrotoxic meds 

## 2020-04-19 NOTE — Patient Instructions (Signed)
See assessment and plan under each diagnosis in the problem list and acutely for this visit 

## 2020-04-19 NOTE — Assessment & Plan Note (Signed)
Rhythm regular and rate improved.

## 2020-04-26 ENCOUNTER — Non-Acute Institutional Stay (SKILLED_NURSING_FACILITY): Payer: Medicare Other | Admitting: Adult Health

## 2020-04-26 ENCOUNTER — Encounter: Payer: Self-pay | Admitting: Adult Health

## 2020-04-26 DIAGNOSIS — I495 Sick sinus syndrome: Secondary | ICD-10-CM

## 2020-04-26 DIAGNOSIS — E039 Hypothyroidism, unspecified: Secondary | ICD-10-CM | POA: Diagnosis not present

## 2020-04-26 DIAGNOSIS — I5023 Acute on chronic systolic (congestive) heart failure: Secondary | ICD-10-CM | POA: Diagnosis not present

## 2020-04-26 DIAGNOSIS — J301 Allergic rhinitis due to pollen: Secondary | ICD-10-CM

## 2020-04-26 DIAGNOSIS — I129 Hypertensive chronic kidney disease with stage 1 through stage 4 chronic kidney disease, or unspecified chronic kidney disease: Secondary | ICD-10-CM

## 2020-04-26 MED ORDER — HYDRALAZINE HCL 50 MG PO TABS: 50.0000 mg | ORAL_TABLET | Freq: Three times a day (TID) | ORAL | 0 refills | Status: DC

## 2020-04-26 MED ORDER — LEVOTHYROXINE SODIUM 150 MCG PO TABS: 150.0000 ug | ORAL_TABLET | Freq: Every day | ORAL | 0 refills | Status: AC

## 2020-04-26 MED ORDER — DICLOFENAC SODIUM 1 % EX GEL: 1.0000 "application " | Freq: Three times a day (TID) | CUTANEOUS | 0 refills | Status: AC | PRN

## 2020-04-26 MED ORDER — FLUTICASONE PROPIONATE 50 MCG/ACT NA SUSP: 1.0000 | Freq: Every day | NASAL | 0 refills | Status: AC | PRN

## 2020-04-26 MED ORDER — AMLODIPINE BESYLATE 5 MG PO TABS: 5.0000 mg | ORAL_TABLET | Freq: Every day | ORAL | 0 refills | Status: AC

## 2020-04-26 MED ORDER — FUROSEMIDE 20 MG PO TABS: 20.0000 mg | ORAL_TABLET | Freq: Every day | ORAL | 0 refills | Status: AC

## 2020-04-26 NOTE — Progress Notes (Signed)
Location:  [facility] Room Number: 311-A Place of Service:  SNF (31) Provider:  Kenard Gower, DNP, FNP-BC  Patient Care Team: Patient, No Pcp Per as PCP - General (General Practice)  Extended Emergency Contact Information Primary Emergency Contact: Davis,Barbara Address: [address]          [place], [place] [address] [place] Home Phone: [phone] Relation: None  Code Status:  Full Code  Goals of care: Advanced Directive information Advanced Directives 04/19/2020  Does Patient Have a Medical Advance Directive? Yes  Type of Advance Directive (No Data)  Does patient want to make changes to medical advance directive? No - Patient declined  Would patient like information on creating a medical advance directive? -     Chief Complaint  Patient presents with  . Discharge Note    Patient seen for discharge from SNF    HPI:  Pt is a 86 y.o. female who is for discharge home on 04/28/20 with Home health PT and OT.  She was admitted to [facility] on 04/15/20 post hospitalization 04/06/20 to 04/15/20 post [facility] hospitalization 04/06/20 to 04/15/20 for acute on chronic systolic CHF. Echocardiogram showed septal hypokinesia.  Chest x-ray showed mild vascular congestion and Troponin 1 was mildly elevated. She was diuresed with IV Lasix and given IV supplementation. She was found to have severe bradycardia and junctional rhythm. EP was consulted but resident declined pacemaker placement. TSH level was 137.224 micro IU, elevated. She has stopped taking Levothyroxine for 3-4 months prior to hospitalization. Levothyroxine was resumed on a higher dose.She has PMH of essential hypertension and hypothyroidism.  Patient was admitted to this facility for short-term rehabilitation after the patient's recent hospitalization.  Patient has completed SNF rehabilitation and therapy has cleared the patient for discharge.   Past Medical  History:  Diagnosis Date  . CHF (congestive heart failure) (HCC)   . Hypertension   . SSS (sick sinus syndrome) (HCC)   . Thyroid disease    History reviewed. No pertinent surgical history.  No Known Allergies  Outpatient Encounter Medications as of 04/26/2020  Medication Sig  . acetaminophen (TYLENOL) 325 MG tablet Take 2 tablets (650 mg total) by mouth at bedtime as needed (insomnia).  Marland Kitchen amLODipine (NORVASC) 5 MG tablet Take 5 mg by mouth daily. For HTN  . bisacodyl (DULCOLAX) 10 MG suppository Place 10 mg rectally daily as needed for moderate constipation.  . cholecalciferol (VITAMIN D) 25 MCG (1000 UNIT) tablet Take 1,000 Units by mouth daily.  . diclofenac Sodium (VOLTAREN) 1 % GEL Apply 1 application topically 3 (three) times daily as needed. Apply to affected joint  . fluticasone (FLONASE) 50 MCG/ACT nasal spray Place 1 spray into both nostrils daily as needed for congestion.  . furosemide (LASIX) 20 MG tablet Take 1 tablet (20 mg total) by mouth daily.  . hydrALAZINE (APRESOLINE) 50 MG tablet Take 1 tablet (50 mg total) by mouth 3 (three) times daily.  Marland Kitchen levothyroxine (SYNTHROID) 150 MCG tablet Take 1 tablet (150 mcg total) by mouth daily.  . magnesium hydroxide (MILK OF MAGNESIA) 400 MG/5ML suspension Take 30 mLs by mouth daily as needed for mild constipation.  . NON FORMULARY Take 120 mLs by mouth daily. MedPass  . NON FORMULARY Heart Healthy mech soft/thin diet consistency  . Sodium Phosphates (RA SALINE ENEMA RE) If not relieved by Biscodyl suppository, give disposable Saline Enema rectally X 1 dose/24 hrs as needed (Do not use constipation standing orders for  residents with renal failure/CFR less than 30. Contact MD for orders)(Physician Or   No facility-administered encounter medications on file as of 04/26/2020.    Review of Systems  GENERAL: No change in appetite, no fatigue, no weight changes, no fever, chills or weakness MOUTH and THROAT: Denies oral discomfort,  gingival pain or bleeding RESPIRATORY: no cough, SOB, DOE, wheezing, hemoptysis CARDIAC: No chest pain, edema or palpitations GI: No abdominal pain, diarrhea, constipation, heart burn, nausea or vomiting GU: Denies dysuria, frequency, hematuria or discharge NEUROLOGICAL: Denies dizziness, syncope, numbness, or headache PSYCHIATRIC: Denies feelings of depression or anxiety. No report of hallucinations, insomnia, paranoia, or agitation    There is no immunization history on file for this patient.   Pertinent  Health Maintenance Due  Topic Date Due  . DEXA SCAN  Never done  . PNA vac Low Risk Adult (1 of 2 - PCV13) Never done  . INFLUENZA VACCINE  07/31/2020    Vitals:   04/26/20 0955  BP: (!) 147/68  Pulse: 69  Resp: 18  Temp: 97.9 F (36.6 C)  TempSrc: Oral  Weight: 134 lb 6.4 oz (61 kg)  Height: 5\' 6"  (1.676 m)   Body mass index is 21.69 kg/m.  Physical Exam  GENERAL APPEARANCE: Well nourished. In no acute distress. Normal body habitus SKIN:  Skin is warm and dry.  MOUTH and THROAT: Lips are without lesions. Oral mucosa is moist and without lesions. Tongue is normal in shape, size, and color and without lesions RESPIRATORY: Breathing is even & unlabored, BS CTAB CARDIAC: RRR, no murmur,no extra heart sounds, Right ankle 2+ edema and left ankle 1+edema GI: Abdomen soft, normal BS, no masses, no tenderness EXTREMITIES:  Able to move X 4 extremities NEUROLOGICAL: There is no tremor. Speech is clear. Alert and oriented X 3. PSYCHIATRIC:  Affect and behavior are appropriate  Labs reviewed: Recent Labs    04/06/20 1653 04/07/20 0502 04/09/20 0346 04/09/20 0346 04/10/20 0302 04/11/20 0338 04/13/20 0407  NA  --    < > 141  --  140 140  --   K  --    < > 3.8  --  4.8 5.0  --   CL  --    < > 105  --  105 106  --   CO2  --    < > 26  --  27 26  --   GLUCOSE  --    < > 86  --  79 80  --   BUN  --    < > 26*  --  24* 33*  --   CREATININE  --    < > 1.77*   < > 1.67*  1.83* 1.71*  CALCIUM  --    < > 8.8*  --  8.9 8.7*  --   MG 2.6*  --   --   --  2.2  --   --    < > = values in this interval not displayed.   Recent Labs    04/06/20 1653 04/11/20 0338  AST 123* 82*  ALT 44 33  ALKPHOS 31* 23*  BILITOT 0.7 0.3  PROT 8.2* 6.7  ALBUMIN 3.8 3.0*   Recent Labs    04/06/20 1213  WBC 4.6  HGB 10.7*  HCT 33.9*  MCV 94.2  PLT 204   Lab Results  Component Value Date   TSH 121.420 (H) 04/11/2020    Lab Results  Component Value Date   CHOL 298 (  H) 04/07/2020   HDL 83 04/07/2020   LDLCALC 197 (H) 04/07/2020   TRIG 89 04/07/2020   CHOLHDL 3.6 04/07/2020    Significant Diagnostic Results in last 30 days:  DG Chest 2 View  Result Date: 04/06/2020 CLINICAL DATA:  Chest pain, short of breath EXAM: CHEST - 2 VIEW COMPARISON:  None. FINDINGS: Elevation of the LEFT hemidiaphragm. Mediastinum is shifted rightward related to the diaphragmatic elevation. Heart appears enlarged. Ectatic aorta. Mild central venous congestion. No focal infiltrate. No pneumothorax. Degenerate changes of the shoulders. IMPRESSION: 1. Cardiomegaly and central venous congestion. 2. Marked elevation of the LEFT hemidiaphragm Electronically Signed   By: Genevive Bi M.D.   On: 04/06/2020 12:53   ECHOCARDIOGRAM COMPLETE  Result Date: 04/07/2020    ECHOCARDIOGRAM REPORT   Patient Name:   Latasha Norton Date of Exam: 04/07/2020 Medical Rec #:  [id number]      Height:       66.0 in Accession #:    [id number]     Weight:       134.0 lb Date of Birth:  26-Dec-1925      BSA:          1.687 m Patient Age:    89 years       BP:           143/69 mmHg Patient Gender: F              HR:           58 bpm. Exam Location:  Inpatient Procedure: 2D Echo Indications:     CHF-Acute Diastolic 428.31 / I50.31  History:         Patient has no prior history of Echocardiogram examinations.                  Signs/Symptoms:dementia; Risk Factors:Hypertension.                  Hypothyroidism.  Sonographer:      Leta Jungling RDCS Referring Phys:  [id number] Rometta Emery Diagnosing Phys: Orpah Cobb MD IMPRESSIONS  1. Left ventricular ejection fraction, by estimation, is 45 to 50%. The left ventricle has mildly decreased function. The left ventricle demonstrates regional wall motion abnormalities (see scoring diagram/findings for description). There is mild concentric left ventricular hypertrophy of the anterior and posterior segments. Left ventricular diastolic parameters are indeterminate. There is mild hypokinesis of the left ventricular, entire septal wall.  2. Right ventricular systolic function is normal. The right ventricular size is normal. There is normal pulmonary artery systolic pressure.  3. Left atrial size was mildly dilated.  4. Right atrial size was mildly dilated.  5. The mitral valve is degenerative. Mild mitral valve regurgitation.  6. The aortic valve is tricuspid. Aortic valve regurgitation is mild. Mild to moderate aortic valve stenosis.  7. There is mild (Grade II) atheroma plaque involving the aortic root and ascending aorta.  8. The inferior vena cava is normal in size with greater than 50% respiratory variability, suggesting right atrial pressure of 3 mmHg. FINDINGS  Left Ventricle: Left ventricular ejection fraction, by estimation, is 45 to 50%. The left ventricle has mildly decreased function. The left ventricle demonstrates regional wall motion abnormalities. Mild hypokinesis of the left ventricular, entire septal wall. The left ventricular internal cavity size was normal in size. There is mild concentric left ventricular hypertrophy of the anterior and posterior segments. Left ventricular diastolic parameters are indeterminate.  LV Wall Scoring: The entire septum is hypokinetic.  The entire anterior wall, entire lateral wall, entire inferior wall, and apex are normal. Right Ventricle: The right ventricular size is normal. No increase in right ventricular wall thickness. Right ventricular  systolic function is normal. There is normal pulmonary artery systolic pressure. The tricuspid regurgitant velocity is 2.04 m/s, and  with an assumed right atrial pressure of 3 mmHg, the estimated right ventricular systolic pressure is 96.0 mmHg. Left Atrium: Left atrial size was mildly dilated. Right Atrium: Right atrial size was mildly dilated. Pericardium: There is no evidence of pericardial effusion. Mitral Valve: The mitral valve is degenerative in appearance. There is mild thickening of the mitral valve leaflet(s). There is moderate calcification of the mitral valve leaflet(s). Mild to moderate mitral annular calcification. Mild mitral valve regurgitation. Tricuspid Valve: The tricuspid valve is normal in structure. Tricuspid valve regurgitation is mild. Aortic Valve: The aortic valve is tricuspid. . There is mild thickening and mild calcification of the aortic valve. Aortic valve regurgitation is mild. Aortic regurgitation PHT measures 789 msec. Mild to moderate aortic stenosis is present. Mild aortic valve annular calcification. There is mild thickening of the aortic valve. There is mild calcification of the aortic valve. Pulmonic Valve: The pulmonic valve was normal in structure. Pulmonic valve regurgitation is trivial. Aorta: The aortic root is normal in size and structure. There is mild (Grade II) atheroma plaque involving the aortic root and ascending aorta. Venous: The inferior vena cava is normal in size with greater than 50% respiratory variability, suggesting right atrial pressure of 3 mmHg. IAS/Shunts: No atrial level shunt detected by color flow Doppler.  LEFT VENTRICLE PLAX 2D LVIDd:         3.60 cm  Diastology LVIDs:         3.10 cm  LV e' lateral:   4.39 cm/s LV PW:         1.40 cm  LV E/e' lateral: 11.5 LV IVS:        1.50 cm  LV e' medial:    4.28 cm/s LVOT diam:     1.80 cm  LV E/e' medial:  11.8 LV SV:         48 LV SV Index:   29 LVOT Area:     2.54 cm  LEFT ATRIUM         Index LA diam:     3.30 cm 1.96 cm/m  AORTIC VALVE LVOT Vmax:   80.90 cm/s LVOT Vmean:  54.700 cm/s LVOT VTI:    0.189 m AI PHT:      789 msec  AORTA Ao Asc diam: 2.90 cm MITRAL VALVE               TRICUSPID VALVE MV Area (PHT): 3.51 cm    TR Peak grad:   16.6 mmHg MV Decel Time: 216 msec    TR Vmax:        204.00 cm/s MV E velocity: 50.35 cm/s MV A velocity: 28.70 cm/s  SHUNTS MV E/A ratio:  1.75        Systemic VTI:  0.19 m                            Systemic Diam: 1.80 cm Dixie Dials MD Electronically signed by Dixie Dials MD Signature Date/Time: 04/07/2020/12:24:40 PM    Final     Assessment/Plan  1. Acute on chronic systolic congestive heart failure ([place]) - follow up with cardiology - furosemide (LASIX) 20 MG tablet;  Take 1 tablet (20 mg total) by mouth daily.  Dispense: 30 tablet; Refill: 0 - hydrALAZINE (APRESOLINE) 50 MG tablet; Take 1 tablet (50 mg total) by mouth 3 (three) times daily.  Dispense: 90 tablet; Refill: 0  2. Acquired hypothyroidism Lab Results  Component Value Date   TSH 121.420 (H) 04/11/2020   - levothyroxine (SYNTHROID) 150 MCG tablet; Take 1 tablet (150 mcg total) by mouth daily.  Dispense: 30 tablet; Refill: 0 - tsh in 3 weeks  3. SSS (sick sinus syndrome) (HCC) - has declined pacemaker placement  4. Benign hypertension with chronic kidney disease - amLODipine (NORVASC) 5 MG tablet; Take 1 tablet (5 mg total) by mouth daily. For HTN  Dispense: 30 tablet; Refill: 0 - hydrALAZINE (APRESOLINE) 50 MG tablet; Take 1 tablet (50 mg total) by mouth 3 (three) times daily.  Dispense: 90 tablet; Refill: 0  5. Seasonal allergic rhinitis due to pollen - fluticasone (FLONASE) 50 MCG/ACT nasal spray; Place 1 spray into both nostrils daily as needed.  Dispense: 16 g; Refill: 0     I have filled out patient's discharge paperwork and e-prescribed medications.  Patient will receive home health PT and OT.  DME provided:  3-in-1 and rolling walker  Total discharge time: Greater than 30  minutes Greater than 50 % was spent in counseling and coordination of care.   Discharge time involved coordination of the discharge process with social worker, nursing staff and therapy department. Medical justification for home health services/DME verified.    Kenard Gower, DNP, FNP-BC [facility] [phone] (Monday-Friday 8:00 a.m. - 5:00 p.m.) [phone] (after hours)

## 2020-05-06 ENCOUNTER — Other Ambulatory Visit: Payer: Self-pay

## 2020-05-06 ENCOUNTER — Emergency Department (HOSPITAL_COMMUNITY)
Admission: EM | Admit: 2020-05-06 | Discharge: 2020-05-06 | Disposition: A | Discharge status: Home / Self Care | Payer: Medicare Other | Attending: Emergency Medicine | Admitting: Emergency Medicine

## 2020-05-06 ENCOUNTER — Emergency Department (HOSPITAL_COMMUNITY): Payer: Medicare Other

## 2020-05-06 ENCOUNTER — Encounter (HOSPITAL_COMMUNITY): Payer: Self-pay

## 2020-05-06 DIAGNOSIS — I130 Hypertensive heart and chronic kidney disease with heart failure and stage 1 through stage 4 chronic kidney disease, or unspecified chronic kidney disease: Secondary | ICD-10-CM | POA: Insufficient documentation

## 2020-05-06 DIAGNOSIS — Z79899 Other long term (current) drug therapy: Secondary | ICD-10-CM | POA: Insufficient documentation

## 2020-05-06 DIAGNOSIS — N183 Chronic kidney disease, stage 3 unspecified: Secondary | ICD-10-CM | POA: Diagnosis not present

## 2020-05-06 DIAGNOSIS — I509 Heart failure, unspecified: Secondary | ICD-10-CM | POA: Insufficient documentation

## 2020-05-06 DIAGNOSIS — I639 Cerebral infarction, unspecified: Secondary | ICD-10-CM

## 2020-05-06 DIAGNOSIS — R531 Weakness: Secondary | ICD-10-CM

## 2020-05-06 DIAGNOSIS — E039 Hypothyroidism, unspecified: Secondary | ICD-10-CM | POA: Diagnosis not present

## 2020-05-06 LAB — I-STAT CHEM 8, ED
BUN: 30 mg/dL — ABNORMAL HIGH (ref 8–23)
Calcium, Ion: 1.01 mmol/L — ABNORMAL LOW (ref 1.15–1.40)
Chloride: 102 mmol/L (ref 98–111)
Creatinine, Ser: 1.2 mg/dL — ABNORMAL HIGH (ref 0.44–1.00)
Glucose, Bld: 91 mg/dL (ref 70–99)
HCT: 34 % — ABNORMAL LOW (ref 36.0–46.0)
Hemoglobin: 11.6 g/dL — ABNORMAL LOW (ref 12.0–15.0)
Potassium: 3.9 mmol/L (ref 3.5–5.1)
Sodium: 138 mmol/L (ref 135–145)
TCO2: 26 mmol/L (ref 22–32)

## 2020-05-06 LAB — COMPREHENSIVE METABOLIC PANEL
ALT: 16 U/L (ref 0–44)
AST: 28 U/L (ref 15–41)
Albumin: 3.8 g/dL (ref 3.5–5.0)
Alkaline Phosphatase: 40 U/L (ref 38–126)
Anion gap: 11 (ref 5–15)
BUN: 28 mg/dL — ABNORMAL HIGH (ref 8–23)
CO2: 25 mmol/L (ref 22–32)
Calcium: 8.9 mg/dL (ref 8.9–10.3)
Chloride: 102 mmol/L (ref 98–111)
Creatinine, Ser: 1.3 mg/dL — ABNORMAL HIGH (ref 0.44–1.00)
GFR calc Af Amer: 41 mL/min — ABNORMAL LOW (ref >60)
GFR calc non Af Amer: 36 mL/min — ABNORMAL LOW (ref >60)
Glucose, Bld: 98 mg/dL (ref 70–99)
Potassium: 3.9 mmol/L (ref 3.5–5.1)
Sodium: 138 mmol/L (ref 135–145)
Total Bilirubin: 0.5 mg/dL (ref 0.3–1.2)
Total Protein: 8.1 g/dL (ref 6.5–8.1)

## 2020-05-06 LAB — DIFFERENTIAL
Abs Immature Granulocytes: 0.01 10*3/uL (ref 0.00–0.07)
Basophils Absolute: 0 10*3/uL (ref 0.0–0.1)
Basophils Relative: 0 %
Eosinophils Absolute: 0.2 10*3/uL (ref 0.0–0.5)
Eosinophils Relative: 3 %
Immature Granulocytes: 0 %
Lymphocytes Relative: 16 %
Lymphs Abs: 1 10*3/uL (ref 0.7–4.0)
Monocytes Absolute: 0.8 10*3/uL (ref 0.1–1.0)
Monocytes Relative: 14 %
Neutro Abs: 3.9 10*3/uL (ref 1.7–7.7)
Neutrophils Relative %: 67 %

## 2020-05-06 LAB — URINALYSIS, ROUTINE W REFLEX MICROSCOPIC
Bilirubin Urine: NEGATIVE
Glucose, UA: NEGATIVE mg/dL
Hgb urine dipstick: NEGATIVE
Ketones, ur: NEGATIVE mg/dL
Nitrite: NEGATIVE
Protein, ur: NEGATIVE mg/dL
Specific Gravity, Urine: 1.004 — ABNORMAL LOW (ref 1.005–1.030)
pH: 7 (ref 5.0–8.0)

## 2020-05-06 LAB — CBC
HCT: 33.1 % — ABNORMAL LOW (ref 36.0–46.0)
Hemoglobin: 10.3 g/dL — ABNORMAL LOW (ref 12.0–15.0)
MCH: 31.1 pg (ref 26.0–34.0)
MCHC: 31.1 g/dL (ref 30.0–36.0)
MCV: 100 fL (ref 80.0–100.0)
Platelets: 247 10*3/uL (ref 150–400)
RBC: 3.31 MIL/uL — ABNORMAL LOW (ref 3.87–5.11)
RDW: 15.9 % — ABNORMAL HIGH (ref 11.5–15.5)
WBC: 5.9 10*3/uL (ref 4.0–10.5)
nRBC: 0 % (ref 0.0–0.2)

## 2020-05-06 LAB — RAPID URINE DRUG SCREEN, HOSP PERFORMED
Amphetamines: NOT DETECTED
Barbiturates: NOT DETECTED
Benzodiazepines: NOT DETECTED
Cocaine: NOT DETECTED
Opiates: NOT DETECTED
Tetrahydrocannabinol: NOT DETECTED

## 2020-05-06 LAB — CBG MONITORING, ED: Glucose-Capillary: 84 mg/dL (ref 70–99)

## 2020-05-06 LAB — PROTIME-INR
INR: 1 (ref 0.8–1.2)
Prothrombin Time: 13.2 seconds (ref 11.4–15.2)

## 2020-05-06 LAB — APTT: aPTT: 35 seconds (ref 24–36)

## 2020-05-06 LAB — ETHANOL: Alcohol, Ethyl (B): <10 mg/dL (ref <10)

## 2020-05-06 MED ORDER — ASPIRIN 81 MG PO CHEW
81.0000 mg | CHEWABLE_TABLET | Freq: Every day | ORAL | 0 refills | Status: AC
Start: 2020-05-06 — End: ?

## 2020-05-06 MED ORDER — ZOLPIDEM TARTRATE 5 MG PO TABS: 2.5000 mg | ORAL_TABLET | Freq: Every evening | ORAL | 0 refills | Status: AC | PRN

## 2020-05-06 NOTE — ED Notes (Signed)
Patient to MRI.

## 2020-05-06 NOTE — Code Documentation (Signed)
Latasha Norton is a 86 yr old resident of [facility] arriving as a Code Stroke via EMS at [phone]. Per EMS, pt has had slurred speech for several days, had word finding difficulty last night, and weakness and shortness of breath this morning. Pt examined at bridge; NIHSS 4 for bilateral leg weakness (couldn't resist gravity). Pt taken to CT at 0917. Granddaughter contacted by phone, who endorses a last known well of 05/04/2020 at 1400. CT neg for hemorrhage per Neurologist, and clinical exam non-focal and appropriate for age. Pt is also out of the window for TPA as well as Thrombectomy. Treatment decision made at 0925 to obtain MRI and continue mNIHSS and Vitals q 2 hours for 12 hrs, then q 4. Bedside handoff with ED RN Wynona Luna.

## 2020-05-06 NOTE — Discharge Instructions (Signed)
Follow-up with your doctor for further evaluation of generalized weakness.  Start an 81 mg aspirin a day.

## 2020-05-06 NOTE — ED Triage Notes (Signed)
Patient arrives via ems due developing various stroke symptoms over the past few days. Per ems, the patient has had generalized weakness x2 weeks, however, the patient has had slurred speech since Wednesday, and developed a worsening stutter last night. LKW was Wednesday. Family called ems this morning due the patient worsening weakness, more weakness in the left arm.   Patient arrives A&Ox3. No pain. #20g to the Left wrist.

## 2020-05-06 NOTE — ED Notes (Addendum)
Patient family given discharge instructions. Questions were answered. Patient verbalized understanding of discharge instructions and care at home.

## 2020-05-06 NOTE — Consult Note (Signed)
NEURO HOSPITALIST CONSULT NOTE   Requesting physician: Dr. Rubin Payor  Reason for Consult: New onset of "trouble getting her words out"  History obtained from:  EMS, Patient and Chart    HPI:                                                                                                                                          Latasha Norton is an 86 y.o. female with CHF, HTN, SSS and thyroid disease who presents with c/c of acute onset of "trouble getting her words out" as well as generalized weakness for "a few days". Time of symptom onset was determined to be 2 PM on Wednesday based on a phone call by staff to family. EMS feels that she may have demonstrated some mild left upper extremity weakness on their exam, but the patient denies weakness on the left or right, upper or lower extremities. Code Stroke was called en route. On arrival, the patient was fully conversant when asked questions, although with somewhat laconic answers and little spontaneous speech, without facial droop, able to lift upper extremities antigravity with no drift. She was not able to lift her BLE antigravity, however.   NIHSS: 4 (bilateral lower extremity weakness)  Past Medical History:  Diagnosis Date  . CHF (congestive heart failure) (HCC)   . Hypertension   . SSS (sick sinus syndrome) (HCC)   . Thyroid disease     History reviewed. No pertinent surgical history.  History reviewed. No pertinent family history.            Social History:  reports that she has never smoked. She has never used smokeless tobacco. She reports previous alcohol use. She reports current drug use.  No Known Allergies  HOME MEDICATIONS:                                                                                                                      No current facility-administered medications on file prior to encounter.   Current Outpatient Medications on File Prior to Encounter  Medication Sig Dispense  Refill  . acetaminophen (TYLENOL) 325 MG tablet Take 2 tablets (650 mg total) by mouth at bedtime as needed (insomnia). 30 tablet 1  . amLODipine (NORVASC) 5 MG tablet Take 1  tablet (5 mg total) by mouth daily. For HTN 30 tablet 0  . bisacodyl (DULCOLAX) 10 MG suppository Place 10 mg rectally daily as needed for moderate constipation.    . cholecalciferol (VITAMIN D) 25 MCG (1000 UNIT) tablet Take 1,000 Units by mouth daily.    . diclofenac Sodium (VOLTAREN) 1 % GEL Apply 1 application topically 3 (three) times daily as needed. Apply to affected joint 100 g 0  . fluticasone (FLONASE) 50 MCG/ACT nasal spray Place 1 spray into both nostrils daily as needed. 16 g 0  . furosemide (LASIX) 20 MG tablet Take 1 tablet (20 mg total) by mouth daily. 30 tablet 0  . hydrALAZINE (APRESOLINE) 50 MG tablet Take 1 tablet (50 mg total) by mouth 3 (three) times daily. 90 tablet 0  . levothyroxine (SYNTHROID) 150 MCG tablet Take 1 tablet (150 mcg total) by mouth daily. 30 tablet 0  . magnesium hydroxide (MILK OF MAGNESIA) 400 MG/5ML suspension Take 30 mLs by mouth daily as needed for mild constipation.    . NON FORMULARY Take 120 mLs by mouth daily. MedPass    . NON FORMULARY Heart Healthy mech soft/thin diet consistency    . Sodium Phosphates (RA SALINE ENEMA RE) If not relieved by Biscodyl suppository, give disposable Saline Enema rectally X 1 dose/24 hrs as needed (Do not use constipation standing orders for residents with renal failure/CFR less than 30. Contact MD for orders)(Physician Or       ROS:                                                                                                                                       As per HPI. The patient denies any other symptoms.    Blood pressure (!) 154/62, pulse 75, resp. rate 20, height 5\' 6"  (1.676 m), weight 61 kg, SpO2 96 %.   General Examination:                                                                                                        Physical Exam  HEENT-  [place]/AT   Lungs- Respirations unlabored Extremities- No edema  Neurological Examination Mental Status: Awake and alert. Thought content appropriate.  Speech fluent with intact naming of common objects. Able to follow all commands. Oriented to month, year, city, state and situation.  Cranial Nerves: II: Visual fields intact. PERRL.   III,IV, VI: No ptosis. EOMI.  V,VII: Smile symmetric, facial temp sensation equal bilaterally VIII: hearing intact  to voice IX,X: No hoarseness or hypophonia XI: No asymmetry XII: Midline tongue extension Motor: BUE 4+/5 BLE 2/5 in the context of poor effort.  Sensory: Temp and light touch intact throughout, bilaterally Deep Tendon Reflexes: 2+ and symmetric throughout Plantars: Right: downgoing   Left: downgoing Cerebellar: No ataxia with FNF bilaterally.  Gait: Unable to assess   Lab Results: Basic Metabolic Panel: Recent Labs  Lab 05/06/20 0920  NA 138  K 3.9  CL 102  GLUCOSE 91  BUN 30*  CREATININE 1.20*    CBC: Recent Labs  Lab 05/06/20 0916 05/06/20 0920  WBC 5.9  --   NEUTROABS 3.9  --   HGB 10.3* 11.6*  HCT 33.1* 34.0*  MCV 100.0  --   PLT 247  --     Cardiac Enzymes: No results for input(s): CKTOTAL, CKMB, CKMBINDEX, TROPONINI in the last 168 hours.  Lipid Panel: No results for input(s): CHOL, TRIG, HDL, CHOLHDL, VLDL, LDLCALC in the last 168 hours.  Imaging: DG Chest Portable 1 View  Result Date: 05/06/2020 CLINICAL DATA:  86 year old female with a history of weakness EXAM: PORTABLE CHEST 1 VIEW COMPARISON:  04/06/2020 FINDINGS: Cardiomediastinal silhouette unchanged in size and contour with cardiomegaly. Right rotation accentuates the right heart border. No pneumothorax or pleural effusion. Similar appearance of coarsened interstitial markings and prominent central vasculature. No confluent airspace disease. Similar appearance of elevation of the left hemidiaphragm. Degenerative changes of the  bilateral shoulders. IMPRESSION: Chronic lung changes without evidence of acute cardiopulmonary disease. Cardiomegaly and prominent central vasculature, potentially related to pulmonary hypertension. Electronically Signed   By: Gilmer Mor D.O.   On: 05/06/2020 09:52   CT HEAD CODE STROKE WO CONTRAST  Result Date: 05/06/2020 CLINICAL DATA:  Code stroke. Focal neuro deficit. Speech problem and left sided weakness. EXAM: CT HEAD WITHOUT CONTRAST TECHNIQUE: Contiguous axial images were obtained from the base of the skull through the vertex without intravenous contrast. COMPARISON:  None. FINDINGS: Brain: Mild generalized atrophy without hydrocephalus. Patchy white matter changes most likely chronic microvascular ischemia. Negative for acute infarct, hemorrhage, mass Vascular: Negative for hyperdense vessel Skull: Negative Sinuses/Orbits: Paranasal sinuses clear. Left ocular surgery. No orbital mass lesion. Other: None ASPECTS ([place] Stroke Program Early CT Score) - Ganglionic level infarction (caudate, lentiform nuclei, internal capsule, insula, M1-M3 cortex): 7 - Supraganglionic infarction (M4-M6 cortex): 3 Total score (0-10 with 10 being normal): 10 IMPRESSION: 1. No acute abnormality 2. ASPECTS is 10 3. Atrophy and mild chronic microvascular ischemic change. 4. Preliminary report texted to Dr. Otelia Limes via Loretha Stapler Electronically Signed   By: Marlan Palau M.D.   On: 05/06/2020 09:35    Assessment: 86 year old female presenting with a 2 week history of diffuse weakness, 1 week history of slurred speech and > 24 hours of "trouble getting her words out".  1. Neurological exam is nonfocal. Poor effort on BLE strength exam confounds ability to completely rule out a possible deficit, however.  2. Overall impression is that her diffuse weakness is more likely secondary to CHF and/or deconditioning that stroke. Her exam is not consistent with a receptive or expressive aphasia, but a small stroke should be ruled  out with MRI.   Recommendations: 1. MRI brain without contrast.  2. MRA head 3. Start ASA 81 mg po qd   Electronically signed: Dr. Caryl Pina 05/06/2020, 9:57 AM

## 2020-05-06 NOTE — ED Provider Notes (Signed)
[facility] EMERGENCY DEPARTMENT Provider Note   CSN: [id number] Arrival date & time: 05/06/20  9381  An emergency department physician performed an initial assessment on this suspected stroke patient at 0912.  History Chief Complaint  Patient presents with   Code Stroke    Latasha Norton is a 86 y.o. female.  HPI Patient presents with generalized weakness.  Reportedly has been feeling weak all over since a few days ago.  Either Tuesday or Wednesday.  Last night potentially may be today developed some difficulty speaking.  May have a little bit of stuttering or slurred speech.  Mild word finding.  Feeling somewhat better now.  Does not feel more weak on left or right side.  EMS stated there may have been some left-sided weakness and maybe up gaze preference to the left.  Code stroke have been called prehospital. Patient came in as a code stroke and was met at the bridge by Dr. Cheral Marker and myself.    Past Medical History:  Diagnosis Date   CHF (congestive heart failure) (HCC)    Hypertension    SSS (sick sinus syndrome) ([place])    Thyroid disease     Patient Active Problem List   Diagnosis Date Noted   Rhinitis, allergic 04/19/2020   SSS (sick sinus syndrome) (HCC)    Hypokalemia 04/06/2020   Benign essential HTN 04/06/2020   Hypothyroid 04/06/2020   Acute exacerbation of CHF (congestive heart failure) ([place]) 04/06/2020   CKD (chronic kidney disease), stage III 04/06/2020   Bradycardia 04/06/2020    History reviewed. No pertinent surgical history.   OB History   No obstetric history on file.     History reviewed. No pertinent family history.  Social History   Tobacco Use   Smoking status: Never Smoker   Smokeless tobacco: Never Used  Substance Use Topics   Alcohol use: Not Currently   Drug use: Yes    Home Medications Prior to Admission medications   Medication Sig Start Date End Date Taking? Authorizing Provider    acetaminophen (TYLENOL) 325 MG tablet Take 2 tablets (650 mg total) by mouth at bedtime as needed (insomnia). 04/15/20  Yes Dixie Dials, MD  amLODipine (NORVASC) 5 MG tablet Take 1 tablet (5 mg total) by mouth daily. For HTN 04/26/20  Yes Medina-Vargas, Monina C, NP  Ascorbic Acid (VITAMIN C) 1000 MG tablet Take 500 mg by mouth daily.   Yes [provider]  bisacodyl (DULCOLAX) 10 MG suppository Place 10 mg rectally daily as needed for moderate constipation.   Yes [provider]  cholecalciferol (VITAMIN D) 25 MCG (1000 UNIT) tablet Take 1,000 Units by mouth daily.   Yes [provider]  diclofenac Sodium (VOLTAREN) 1 % GEL Apply 1 application topically 3 (three) times daily as needed. Apply to affected joint 04/26/20  Yes Medina-Vargas, Monina C, NP  fluticasone (FLONASE) 50 MCG/ACT nasal spray Place 1 spray into both nostrils daily as needed. 04/26/20  Yes Medina-Vargas, Monina C, NP  furosemide (LASIX) 20 MG tablet Take 1 tablet (20 mg total) by mouth daily. 04/26/20  Yes Medina-Vargas, Monina C, NP  hydrALAZINE (APRESOLINE) 25 MG tablet Take 25 mg by mouth 3 (three) times daily. 04/16/20  Yes [provider]  levothyroxine (SYNTHROID) 150 MCG tablet Take 1 tablet (150 mcg total) by mouth daily. 04/26/20  Yes Medina-Vargas, Monina C, NP  magnesium hydroxide (MILK OF MAGNESIA) 400 MG/5ML suspension Take 30 mLs by mouth daily as needed for mild constipation.  Yes [provider]  Multiple Vitamin (MULTIVITAMIN) capsule Take 1 capsule by mouth daily.   Yes [provider]  aspirin 81 MG chewable tablet Chew 1 tablet (81 mg total) by mouth daily. 05/06/20   Davonna Belling, MD  zolpidem (AMBIEN) 5 MG tablet Take 0.5 tablets (2.5 mg total) by mouth at bedtime as needed for sleep. 05/06/20   Davonna Belling, MD    Allergies    Patient has no known allergies.  Review of Systems   Review of Systems  Constitutional: Negative for appetite change.   HENT: Negative for congestion.   Respiratory: Negative for shortness of breath.   Cardiovascular: Negative for chest pain.  Gastrointestinal: Negative for abdominal pain.  Genitourinary: Negative for flank pain.  Musculoskeletal: Negative for back pain.  Neurological: Positive for speech difficulty and weakness.  Psychiatric/Behavioral: Negative for confusion.    Physical Exam Updated Vital Signs BP (!) 167/66    Pulse 77    Temp 98.6 F (37 C) (Oral)    Resp 18    Ht 5' 6"  (1.676 m)    Wt 61 kg    SpO2 94%    BMI 21.71 kg/m   Physical Exam Vitals and nursing note reviewed.  HENT:     Head: Normocephalic.     Comments: Face symmetric.  Eye movements intact.  Patient does not have teeth. Eyes:     Pupils: Pupils are equal, round, and reactive to light.  Cardiovascular:     Rate and Rhythm: Regular rhythm.  Pulmonary:     Breath sounds: No wheezing or rhonchi.  Abdominal:     Tenderness: There is no abdominal tenderness.  Musculoskeletal:        General: No tenderness.     Cervical back: Neck supple.  Skin:    Capillary Refill: Capillary refill takes less than 2 seconds.  Neurological:     Mental Status: She is alert and oriented to person, place, and time.     Comments: Patient awake and appropriate.  Moving all extremities equally.  Finger-nose intact bilaterally.  Occasionally had a little stutter but not consistently.  Complete NIH scoring done by neurology.     ED Results / Procedures / Treatments   Labs (all labs ordered are listed, but only abnormal results are displayed) Labs Reviewed  CBC - Abnormal; Notable for the following components:      Result Value   RBC 3.31 (*)    Hemoglobin 10.3 (*)    HCT 33.1 (*)    RDW 15.9 (*)    All other components within normal limits  COMPREHENSIVE METABOLIC PANEL - Abnormal; Notable for the following components:   BUN 28 (*)    Creatinine, Ser 1.30 (*)    GFR calc non Af Amer 36 (*)    GFR calc Af Amer 41 (*)    All  other components within normal limits  URINALYSIS, ROUTINE W REFLEX MICROSCOPIC - Abnormal; Notable for the following components:   Color, Urine STRAW (*)    Specific Gravity, Urine 1.004 (*)    Leukocytes,Ua TRACE (*)    Bacteria, UA MANY (*)    All other components within normal limits  I-STAT CHEM 8, ED - Abnormal; Notable for the following components:   BUN 30 (*)    Creatinine, Ser 1.20 (*)    Calcium, Ion 1.01 (*)    Hemoglobin 11.6 (*)    HCT 34.0 (*)    All other components within normal limits  ETHANOL  PROTIME-INR  APTT  DIFFERENTIAL  RAPID URINE DRUG SCREEN, HOSP PERFORMED  CBG MONITORING, ED    EKG None  Radiology DG Chest Portable 1 View  Result Date: 05/06/2020 CLINICAL DATA:  86 year old female with a history of weakness EXAM: PORTABLE CHEST 1 VIEW COMPARISON:  04/06/2020 FINDINGS: Cardiomediastinal silhouette unchanged in size and contour with cardiomegaly. Right rotation accentuates the right heart border. No pneumothorax or pleural effusion. Similar appearance of coarsened interstitial markings and prominent central vasculature. No confluent airspace disease. Similar appearance of elevation of the left hemidiaphragm. Degenerative changes of the bilateral shoulders. IMPRESSION: Chronic lung changes without evidence of acute cardiopulmonary disease. Cardiomegaly and prominent central vasculature, potentially related to pulmonary hypertension. Electronically Signed   By: Corrie Mckusick D.O.   On: 05/06/2020 09:52   CT HEAD CODE STROKE WO CONTRAST  Result Date: 05/06/2020 CLINICAL DATA:  Code stroke. Focal neuro deficit. Speech problem and left sided weakness. EXAM: CT HEAD WITHOUT CONTRAST TECHNIQUE: Contiguous axial images were obtained from the base of the skull through the vertex without intravenous contrast. COMPARISON:  None. FINDINGS: Brain: Mild generalized atrophy without hydrocephalus. Patchy white matter changes most likely chronic microvascular ischemia.  Negative for acute infarct, hemorrhage, mass Vascular: Negative for hyperdense vessel Skull: Negative Sinuses/Orbits: Paranasal sinuses clear. Left ocular surgery. No orbital mass lesion. Other: None ASPECTS ([place] Stroke Program Early CT Score) - Ganglionic level infarction (caudate, lentiform nuclei, internal capsule, insula, M1-M3 cortex): 7 - Supraganglionic infarction (M4-M6 cortex): 3 Total score (0-10 with 10 being normal): 10 IMPRESSION: 1. No acute abnormality 2. ASPECTS is 10 3. Atrophy and mild chronic microvascular ischemic change. 4. Preliminary report texted to Dr. Cheral Marker via She[facility] Evans Electronically Signed   By: Franchot Gallo M.D.   On: 05/06/2020 09:35    Procedures Procedures (including critical care time)  Medications Ordered in ED Medications - No data to display  ED Course  I have reviewed the triage vital signs and the nursing notes.  Pertinent labs & imaging results that were available during my care of the patient were reviewed by me and considered in my medical decision making (see chart for details).    MDM Rules/Calculators/A&P                      Patient with difficulty speaking and generalized weakness.  Came in as a code stroke but atypical examination.  Head CT and MRI done and showed no stroke.  Likely more generalized changes.  Work-up overall reassuring.  Discussed with patient and family member.  Discharge home with outpatient work-up. Final Clinical Impression(s) / ED Diagnoses Final diagnoses:  Generalized weakness    Rx / DC Orders ED Discharge Orders         Ordered    aspirin 81 MG chewable tablet  Daily     05/06/20 1304    zolpidem (AMBIEN) 5 MG tablet  At bedtime PRN     05/06/20 1338           Davonna Belling, MD 05/06/20 1644

## 2020-05-18 ENCOUNTER — Other Ambulatory Visit: Payer: Self-pay | Admitting: Adult Health

## 2020-05-18 DIAGNOSIS — I129 Hypertensive chronic kidney disease with stage 1 through stage 4 chronic kidney disease, or unspecified chronic kidney disease: Secondary | ICD-10-CM

## 2020-05-20 ENCOUNTER — Ambulatory Visit: Payer: Medicare Other | Admitting: Family Medicine

## 2021-07-15 ENCOUNTER — Emergency Department (HOSPITAL_COMMUNITY): Payer: Medicare Other

## 2021-07-15 ENCOUNTER — Inpatient Hospital Stay (HOSPITAL_COMMUNITY): Payer: Medicare Other

## 2021-07-15 ENCOUNTER — Encounter (HOSPITAL_COMMUNITY): Payer: Self-pay

## 2021-07-15 ENCOUNTER — Other Ambulatory Visit: Payer: Self-pay

## 2021-07-15 ENCOUNTER — Inpatient Hospital Stay (HOSPITAL_COMMUNITY)
Admission: EM | Admit: 2021-07-15 | Discharge: 2021-07-31 | DRG: 064 | Disposition: E | Payer: Medicare Other | Attending: Family Medicine | Admitting: Family Medicine

## 2021-07-15 DIAGNOSIS — G8194 Hemiplegia, unspecified affecting left nondominant side: Secondary | ICD-10-CM | POA: Diagnosis not present

## 2021-07-15 DIAGNOSIS — Z79890 Hormone replacement therapy (postmenopausal): Secondary | ICD-10-CM

## 2021-07-15 DIAGNOSIS — R54 Age-related physical debility: Secondary | ICD-10-CM | POA: Diagnosis present

## 2021-07-15 DIAGNOSIS — Z9189 Other specified personal risk factors, not elsewhere classified: Secondary | ICD-10-CM | POA: Diagnosis not present

## 2021-07-15 DIAGNOSIS — I5041 Acute combined systolic (congestive) and diastolic (congestive) heart failure: Secondary | ICD-10-CM | POA: Diagnosis not present

## 2021-07-15 DIAGNOSIS — I429 Cardiomyopathy, unspecified: Secondary | ICD-10-CM | POA: Diagnosis not present

## 2021-07-15 DIAGNOSIS — I639 Cerebral infarction, unspecified: Secondary | ICD-10-CM | POA: Diagnosis not present

## 2021-07-15 DIAGNOSIS — R638 Other symptoms and signs concerning food and fluid intake: Secondary | ICD-10-CM | POA: Diagnosis not present

## 2021-07-15 DIAGNOSIS — A419 Sepsis, unspecified organism: Secondary | ICD-10-CM | POA: Diagnosis not present

## 2021-07-15 DIAGNOSIS — I214 Non-ST elevation (NSTEMI) myocardial infarction: Secondary | ICD-10-CM | POA: Diagnosis not present

## 2021-07-15 DIAGNOSIS — J9601 Acute respiratory failure with hypoxia: Secondary | ICD-10-CM

## 2021-07-15 DIAGNOSIS — E1122 Type 2 diabetes mellitus with diabetic chronic kidney disease: Secondary | ICD-10-CM | POA: Diagnosis present

## 2021-07-15 DIAGNOSIS — R414 Neurologic neglect syndrome: Secondary | ICD-10-CM | POA: Diagnosis present

## 2021-07-15 DIAGNOSIS — R404 Transient alteration of awareness: Secondary | ICD-10-CM | POA: Diagnosis not present

## 2021-07-15 DIAGNOSIS — I10 Essential (primary) hypertension: Secondary | ICD-10-CM | POA: Diagnosis present

## 2021-07-15 DIAGNOSIS — Z66 Do not resuscitate: Secondary | ICD-10-CM | POA: Diagnosis not present

## 2021-07-15 DIAGNOSIS — H4901 Third [oculomotor] nerve palsy, right eye: Secondary | ICD-10-CM | POA: Diagnosis present

## 2021-07-15 DIAGNOSIS — G936 Cerebral edema: Secondary | ICD-10-CM | POA: Diagnosis not present

## 2021-07-15 DIAGNOSIS — H53462 Homonymous bilateral field defects, left side: Secondary | ICD-10-CM | POA: Diagnosis present

## 2021-07-15 DIAGNOSIS — R1310 Dysphagia, unspecified: Secondary | ICD-10-CM | POA: Diagnosis not present

## 2021-07-15 DIAGNOSIS — W19XXXA Unspecified fall, initial encounter: Secondary | ICD-10-CM | POA: Diagnosis present

## 2021-07-15 DIAGNOSIS — I440 Atrioventricular block, first degree: Secondary | ICD-10-CM | POA: Diagnosis not present

## 2021-07-15 DIAGNOSIS — E1165 Type 2 diabetes mellitus with hyperglycemia: Secondary | ICD-10-CM | POA: Diagnosis not present

## 2021-07-15 DIAGNOSIS — Z9119 Patient's noncompliance with other medical treatment and regimen: Secondary | ICD-10-CM

## 2021-07-15 DIAGNOSIS — N1832 Chronic kidney disease, stage 3b: Secondary | ICD-10-CM | POA: Diagnosis present

## 2021-07-15 DIAGNOSIS — Z7189 Other specified counseling: Secondary | ICD-10-CM | POA: Diagnosis not present

## 2021-07-15 DIAGNOSIS — R6889 Other general symptoms and signs: Secondary | ICD-10-CM | POA: Diagnosis not present

## 2021-07-15 DIAGNOSIS — G9341 Metabolic encephalopathy: Secondary | ICD-10-CM | POA: Diagnosis not present

## 2021-07-15 DIAGNOSIS — Z20822 Contact with and (suspected) exposure to covid-19: Secondary | ICD-10-CM | POA: Diagnosis present

## 2021-07-15 DIAGNOSIS — I130 Hypertensive heart and chronic kidney disease with heart failure and stage 1 through stage 4 chronic kidney disease, or unspecified chronic kidney disease: Secondary | ICD-10-CM | POA: Diagnosis not present

## 2021-07-15 DIAGNOSIS — R471 Dysarthria and anarthria: Secondary | ICD-10-CM | POA: Diagnosis present

## 2021-07-15 DIAGNOSIS — R0682 Tachypnea, not elsewhere classified: Secondary | ICD-10-CM

## 2021-07-15 DIAGNOSIS — Z743 Need for continuous supervision: Secondary | ICD-10-CM | POA: Diagnosis not present

## 2021-07-15 DIAGNOSIS — E039 Hypothyroidism, unspecified: Secondary | ICD-10-CM | POA: Diagnosis not present

## 2021-07-15 DIAGNOSIS — I5022 Chronic systolic (congestive) heart failure: Secondary | ICD-10-CM

## 2021-07-15 DIAGNOSIS — I2510 Atherosclerotic heart disease of native coronary artery without angina pectoris: Secondary | ICD-10-CM | POA: Diagnosis present

## 2021-07-15 DIAGNOSIS — J690 Pneumonitis due to inhalation of food and vomit: Secondary | ICD-10-CM | POA: Diagnosis not present

## 2021-07-15 DIAGNOSIS — R4701 Aphasia: Secondary | ICD-10-CM | POA: Diagnosis present

## 2021-07-15 DIAGNOSIS — I700 Atherosclerosis of aorta: Secondary | ICD-10-CM | POA: Diagnosis not present

## 2021-07-15 DIAGNOSIS — R0902 Hypoxemia: Secondary | ICD-10-CM | POA: Diagnosis not present

## 2021-07-15 DIAGNOSIS — I63511 Cerebral infarction due to unspecified occlusion or stenosis of right middle cerebral artery: Secondary | ICD-10-CM

## 2021-07-15 DIAGNOSIS — I517 Cardiomegaly: Secondary | ICD-10-CM | POA: Diagnosis not present

## 2021-07-15 DIAGNOSIS — I63411 Cerebral infarction due to embolism of right middle cerebral artery: Principal | ICD-10-CM | POA: Diagnosis present

## 2021-07-15 DIAGNOSIS — I472 Ventricular tachycardia, unspecified: Secondary | ICD-10-CM

## 2021-07-15 DIAGNOSIS — I495 Sick sinus syndrome: Secondary | ICD-10-CM | POA: Diagnosis present

## 2021-07-15 DIAGNOSIS — Z9114 Patient's other noncompliance with medication regimen: Secondary | ICD-10-CM

## 2021-07-15 DIAGNOSIS — K148 Other diseases of tongue: Secondary | ICD-10-CM | POA: Diagnosis present

## 2021-07-15 DIAGNOSIS — H518 Other specified disorders of binocular movement: Secondary | ICD-10-CM | POA: Diagnosis present

## 2021-07-15 DIAGNOSIS — N179 Acute kidney failure, unspecified: Secondary | ICD-10-CM | POA: Diagnosis not present

## 2021-07-15 DIAGNOSIS — R29818 Other symptoms and signs involving the nervous system: Secondary | ICD-10-CM | POA: Diagnosis not present

## 2021-07-15 DIAGNOSIS — R29810 Facial weakness: Secondary | ICD-10-CM | POA: Diagnosis not present

## 2021-07-15 DIAGNOSIS — I499 Cardiac arrhythmia, unspecified: Secondary | ICD-10-CM | POA: Diagnosis not present

## 2021-07-15 DIAGNOSIS — Z515 Encounter for palliative care: Secondary | ICD-10-CM | POA: Diagnosis not present

## 2021-07-15 DIAGNOSIS — I447 Left bundle-branch block, unspecified: Secondary | ICD-10-CM | POA: Diagnosis present

## 2021-07-15 DIAGNOSIS — E860 Dehydration: Secondary | ICD-10-CM | POA: Diagnosis present

## 2021-07-15 DIAGNOSIS — Z681 Body mass index (BMI) 19 or less, adult: Secondary | ICD-10-CM

## 2021-07-15 DIAGNOSIS — I6601 Occlusion and stenosis of right middle cerebral artery: Secondary | ICD-10-CM | POA: Diagnosis not present

## 2021-07-15 DIAGNOSIS — F800 Phonological disorder: Secondary | ICD-10-CM | POA: Diagnosis not present

## 2021-07-15 DIAGNOSIS — E785 Hyperlipidemia, unspecified: Secondary | ICD-10-CM | POA: Diagnosis present

## 2021-07-15 DIAGNOSIS — N183 Chronic kidney disease, stage 3 unspecified: Secondary | ICD-10-CM | POA: Diagnosis present

## 2021-07-15 DIAGNOSIS — R29726 NIHSS score 26: Secondary | ICD-10-CM | POA: Diagnosis present

## 2021-07-15 DIAGNOSIS — Z7982 Long term (current) use of aspirin: Secondary | ICD-10-CM

## 2021-07-15 DIAGNOSIS — Z79899 Other long term (current) drug therapy: Secondary | ICD-10-CM

## 2021-07-15 LAB — I-STAT CHEM 8, ED
BUN: 29 mg/dL — ABNORMAL HIGH (ref 8–23)
Calcium, Ion: 1.08 mmol/L — ABNORMAL LOW (ref 1.15–1.40)
Chloride: 112 mmol/L — ABNORMAL HIGH (ref 98–111)
Creatinine, Ser: 1.1 mg/dL — ABNORMAL HIGH (ref 0.44–1.00)
Glucose, Bld: 162 mg/dL — ABNORMAL HIGH (ref 70–99)
HCT: 39 % (ref 36.0–46.0)
Hemoglobin: 13.3 g/dL (ref 12.0–15.0)
Potassium: 3.6 mmol/L (ref 3.5–5.1)
Sodium: 144 mmol/L (ref 135–145)
TCO2: 21 mmol/L — ABNORMAL LOW (ref 22–32)

## 2021-07-15 LAB — CBC
HCT: 39.5 % (ref 36.0–46.0)
Hemoglobin: 12.3 g/dL (ref 12.0–15.0)
MCH: 29 pg (ref 26.0–34.0)
MCHC: 31.1 g/dL (ref 30.0–36.0)
MCV: 93.2 fL (ref 80.0–100.0)
Platelets: 381 10*3/uL (ref 150–400)
RBC: 4.24 MIL/uL (ref 3.87–5.11)
RDW: 14.6 % (ref 11.5–15.5)
WBC: 14.2 10*3/uL — ABNORMAL HIGH (ref 4.0–10.5)
nRBC: 0 % (ref 0.0–0.2)

## 2021-07-15 LAB — COMPREHENSIVE METABOLIC PANEL
ALT: 26 U/L (ref 0–44)
AST: 55 U/L — ABNORMAL HIGH (ref 15–41)
Albumin: 3.2 g/dL — ABNORMAL LOW (ref 3.5–5.0)
Alkaline Phosphatase: 47 U/L (ref 38–126)
Anion gap: 13 (ref 5–15)
BUN: 26 mg/dL — ABNORMAL HIGH (ref 8–23)
CO2: 22 mmol/L (ref 22–32)
Calcium: 9.4 mg/dL (ref 8.9–10.3)
Chloride: 108 mmol/L (ref 98–111)
Creatinine, Ser: 1.32 mg/dL — ABNORMAL HIGH (ref 0.44–1.00)
GFR, Estimated: 38 mL/min — ABNORMAL LOW (ref >60)
Glucose, Bld: 173 mg/dL — ABNORMAL HIGH (ref 70–99)
Potassium: 3.6 mmol/L (ref 3.5–5.1)
Sodium: 143 mmol/L (ref 135–145)
Total Bilirubin: 0.3 mg/dL (ref 0.3–1.2)
Total Protein: 8.5 g/dL — ABNORMAL HIGH (ref 6.5–8.1)

## 2021-07-15 LAB — DIFFERENTIAL
Abs Immature Granulocytes: 0.03 10*3/uL (ref 0.00–0.07)
Basophils Absolute: 0 10*3/uL (ref 0.0–0.1)
Basophils Relative: 0 %
Eosinophils Absolute: 0 10*3/uL (ref 0.0–0.5)
Eosinophils Relative: 0 %
Immature Granulocytes: 0 %
Lymphocytes Relative: 3 %
Lymphs Abs: 0.4 10*3/uL — ABNORMAL LOW (ref 0.7–4.0)
Monocytes Absolute: 0.4 10*3/uL (ref 0.1–1.0)
Monocytes Relative: 3 %
Neutro Abs: 13.4 10*3/uL — ABNORMAL HIGH (ref 1.7–7.7)
Neutrophils Relative %: 94 %

## 2021-07-15 LAB — LIPID PANEL
Cholesterol: 158 mg/dL (ref 0–200)
HDL: 61 mg/dL (ref >40)
LDL Cholesterol: 82 mg/dL (ref 0–99)
Total CHOL/HDL Ratio: 2.6 RATIO
Triglycerides: 73 mg/dL (ref <150)
VLDL: 15 mg/dL (ref 0–40)

## 2021-07-15 LAB — PROTIME-INR
INR: 1.1 (ref 0.8–1.2)
Prothrombin Time: 14.1 seconds (ref 11.4–15.2)

## 2021-07-15 LAB — APTT: aPTT: 28 seconds (ref 24–36)

## 2021-07-15 LAB — HEMOGLOBIN A1C
Hgb A1c MFr Bld: 6.2 % — ABNORMAL HIGH (ref 4.8–5.6)
Mean Plasma Glucose: 131.24 mg/dL

## 2021-07-15 LAB — CK: Total CK: 1012 U/L — ABNORMAL HIGH (ref 38–234)

## 2021-07-15 LAB — CBG MONITORING, ED: Glucose-Capillary: 153 mg/dL — ABNORMAL HIGH (ref 70–99)

## 2021-07-15 MED ORDER — POTASSIUM CHLORIDE 10 MEQ/100ML IV SOLN
10.0000 meq | INTRAVENOUS | Status: AC
  Administered 2021-07-15: 10 meq via INTRAVENOUS
  Filled 2021-07-15: qty 100

## 2021-07-15 MED ORDER — ASPIRIN EC 81 MG PO TBEC
81.0000 mg | DELAYED_RELEASE_TABLET | Freq: Every day | ORAL | Status: DC
  Administered 2021-07-16: 81 mg via ORAL
  Filled 2021-07-15: qty 1

## 2021-07-15 MED ORDER — ACETAMINOPHEN 160 MG/5ML PO SOLN: 650.0000 mg | ORAL | Status: DC | PRN

## 2021-07-15 MED ORDER — HEPARIN SODIUM (PORCINE) 5000 UNIT/ML IJ SOLN: 5000.0000 [IU] | Freq: Three times a day (TID) | INTRAMUSCULAR | Status: DC

## 2021-07-15 MED ORDER — ACETAMINOPHEN 650 MG RE SUPP
650.0000 mg | RECTAL | Status: DC | PRN
  Administered 2021-07-16 – 2021-07-18 (×3): 650 mg via RECTAL
  Filled 2021-07-15 (×4): qty 1

## 2021-07-15 MED ORDER — ACETAMINOPHEN 325 MG PO TABS: 650.0000 mg | ORAL_TABLET | ORAL | Status: DC | PRN

## 2021-07-15 MED ORDER — SODIUM CHLORIDE 0.9% FLUSH: 3.0000 mL | Freq: Once | INTRAVENOUS | Status: DC

## 2021-07-15 MED ORDER — ACETAMINOPHEN 500 MG PO TABS: 500.0000 mg | ORAL_TABLET | Freq: Four times a day (QID) | ORAL | Status: DC | PRN

## 2021-07-15 MED ORDER — LEVOTHYROXINE SODIUM 75 MCG PO TABS: 150.0000 ug | ORAL_TABLET | Freq: Every day | ORAL | Status: DC

## 2021-07-15 MED ORDER — SENNOSIDES-DOCUSATE SODIUM 8.6-50 MG PO TABS: 1.0000 | ORAL_TABLET | Freq: Every evening | ORAL | Status: DC | PRN

## 2021-07-15 MED ORDER — STROKE: EARLY STAGES OF RECOVERY BOOK
Freq: Once | Status: DC
  Filled 2021-07-15 (×2): qty 1

## 2021-07-15 MED ORDER — HYDRALAZINE HCL 20 MG/ML IJ SOLN
5.0000 mg | Freq: Four times a day (QID) | INTRAMUSCULAR | Status: DC | PRN
  Administered 2021-07-15: 5 mg via INTRAVENOUS
  Filled 2021-07-15 (×2): qty 1

## 2021-07-15 MED ORDER — FUROSEMIDE 10 MG/ML IJ SOLN
20.0000 mg | Freq: Once | INTRAMUSCULAR | Status: AC
  Administered 2021-07-15: 20 mg via INTRAVENOUS
  Filled 2021-07-15: qty 2

## 2021-07-15 MED ORDER — FUROSEMIDE 10 MG/ML IJ SOLN
20.0000 mg | Freq: Every day | INTRAMUSCULAR | Status: DC
  Administered 2021-07-16 – 2021-07-18 (×3): 20 mg via INTRAVENOUS
  Filled 2021-07-15 (×3): qty 4

## 2021-07-15 NOTE — ED Triage Notes (Signed)
Pt from home independently last spoke with daughter last night normal at 8pm this afternoon was found on the floor with aphasia and left side paralysis

## 2021-07-15 NOTE — Plan of Care (Signed)
  Problem: Safety: Goal: Ability to remain free from injury will improve Outcome: Progressing   Problem: Skin Integrity: Goal: Risk for impaired skin integrity will decrease Outcome: Progressing   

## 2021-07-15 NOTE — Progress Notes (Signed)
Received as a new admit from MRI at 2028 and was very combative and agitated during that time.  She had been incont as well.  Her vitals were elevated and I did just recheck them and her BP is significantly  lower. Her resp are still elevated in the mid to upper 20s-low ot mid 30s.  This has been her norm since she has been in the ED.  Her lungs sound rhonchus but she is able to cough and clear her lungs.  Her family was at her bedside until 2000 waiting on her to come from MRI, as she was taken from ED to MRI and they were sent to her room to wait for her.  They went ahead and left to go home and said they will call a little later to check on her.

## 2021-07-15 NOTE — ED Notes (Signed)
Ireti,rn report called

## 2021-07-15 NOTE — H&P (Addendum)
History and Physical    Latasha Norton DOB: 08-23-1925 DOA: 07/13/2021  PCP: Patient, No Pcp Per (Inactive) (Confirm with patient/family/NH records and if not entered, this has to be entered at [facility] point of entry) Patient coming from: Home  I have personally briefly reviewed patient's old medical records in [facility]  Chief Complaint: Fall, unable to talk.  HPI: Latasha Norton is a 87 y.o. female with medical history significant of chronic systolic CHF, LVEF 45 to 50% 2021, CKD stage III, HTN, hypothyroidism, presented with new onset of left-sided weakness, left-sided neglect and speech problem.  Patient has trouble speaking, most history provided by patient's daughter and niece at bedside.  Family reported patient for the last 3 days has been living by herself and patient has a strong history of noncompliant with her BP medications.  Family had a phone conversation last night and patient was speaking normally with no specific complaints.  This morning however patient went into her room and found patient on the floor with obvious left-sided weakness and unable to stand up on her own.  Her eyes turned to the right and she could not talk either.  EMS arrived found patient blood pressure 176/76, hypoxia and patient was placed on oxygen nonrebreather.  ED Course: Blood pressure significantly elevated 176/66, hypoxia stabilized on 5 L, physical exam showed left-sided weakness and left-sided neglect.  CT head large right-sided MCA acute infarct.  WBC 14.2.  Review of Systems: Unable to perform ROS patient not able to talk.   Past Medical History:  Diagnosis Date   CHF (congestive heart failure) (HCC)    Hypertension    SSS (sick sinus syndrome) (HCC)    Thyroid disease     History reviewed. No pertinent surgical history.   reports that she has never smoked. She has never used smokeless tobacco. She reports previous alcohol use. She reports current drug use.  No Known  Allergies  No family history on file.   Prior to Admission medications   Medication Sig Start Date End Date Taking? Authorizing Provider  acetaminophen (TYLENOL) 500 MG tablet Take 500 mg by mouth every 6 (six) hours as needed for moderate pain (arthritis).   Yes [provider]  amLODipine (NORVASC) 5 MG tablet Take 1 tablet (5 mg total) by mouth daily. For HTN 04/26/20  Yes Medina-Vargas, Monina C, NP  Ascorbic Acid (VITAMIN C) 1000 MG tablet Take 500 mg by mouth daily.   Yes [provider]  aspirin EC 81 MG tablet Take 81 mg by mouth daily. Swallow whole.   Yes [provider]  cholecalciferol (VITAMIN D) 25 MCG (1000 UNIT) tablet Take 1,000 Units by mouth daily.   Yes [provider]  furosemide (LASIX) 20 MG tablet Take 1 tablet (20 mg total) by mouth daily. Patient taking differently: Take 20 mg by mouth 3 (three) times a week. 04/26/20  Yes Medina-Vargas, Monina C, NP  hydrALAZINE (APRESOLINE) 25 MG tablet Take 25 mg by mouth 3 (three) times daily. 04/16/20  Yes [provider]  hydrochlorothiazide (MICROZIDE) 12.5 MG capsule Take 12.5 mg by mouth daily. 05/03/21  Yes [provider]  levothyroxine (SYNTHROID) 150 MCG tablet Take 1 tablet (150 mcg total) by mouth daily. 04/26/20  Yes Medina-Vargas, Monina C, NP  Multiple Vitamin (MULTIVITAMIN) capsule Take 1 capsule by mouth daily.   Yes [provider]  Vitamin A 2400 MCG (8000 UT) TABS Take 1 tablet by mouth daily.   Yes [provider]  vitamin E 180 MG (400 UNITS) capsule Take 400 Units by mouth daily.   Yes [provider]  acetaminophen (TYLENOL) 325 MG tablet Take 2 tablets (650 mg total) by mouth at bedtime as needed (insomnia). Patient not taking: Reported on 07/02/2021 04/15/20   Orpah Cobb, MD  aspirin 81 MG chewable tablet Chew 1 tablet (81 mg total) by mouth daily. Patient not taking: Reported on 07/25/2021 05/06/20   Benjiman Core, MD   diclofenac Sodium (VOLTAREN) 1 % GEL Apply 1 application topically 3 (three) times daily as needed. Apply to affected joint Patient not taking: Reported on 07/09/2021 04/26/20   Medina-Vargas, Monina C, NP  fluticasone (FLONASE) 50 MCG/ACT nasal spray Place 1 spray into both nostrils daily as needed. Patient not taking: Reported on 07/22/2021 04/26/20   Medina-Vargas, Monina C, NP  zolpidem (AMBIEN) 5 MG tablet Take 0.5 tablets (2.5 mg total) by mouth at bedtime as needed for sleep. Patient not taking: Reported on 07/11/2021 05/06/20   Benjiman Core, MD    Physical Exam: Vitals:   07/27/2021 1316 07/11/2021 1330 07/09/2021 1345 07/30/2021 1400  BP:  (!) 148/60 (!) 141/58 (!) 145/64  Pulse:  68 65 65  Resp:  (!) 34 (!) 33 (!) 28  Temp:      TempSrc:      SpO2: 94% 96% 94% 95%  Weight:      Height:        Constitutional: NAD, calm, comfortable Vitals:   07/23/2021 1316 07/25/2021 1330 07/04/2021 1345 07/03/2021 1400  BP:  (!) 148/60 (!) 141/58 (!) 145/64  Pulse:  68 65 65  Resp:  (!) 34 (!) 33 (!) 28  Temp:      TempSrc:      SpO2: 94% 96% 94% 95%  Weight:      Height:       Eyes: PERRL, lids and conjunctivae normal ENMT: Mucous membranes are moist. Posterior pharynx clear of any exudate or lesions.Normal dentition.  Neck: normal, supple, no masses, no thyromegaly Respiratory: clear to auscultation bilaterally, no wheezing, fine crackles to B/L mid level.  Increasing respiratory effort. No accessory muscle use.  Cardiovascular: Regular rate and rhythm, no murmurs / rubs / gallops. No extremity edema. 2+ pedal pulses. No carotid bruits.  Abdomen: no tenderness, no masses palpated. No hepatosplenomegaly. Bowel sounds positive.  Musculoskeletal: no clubbing / cyanosis. No joint deformity upper and lower extremities. Good ROM, no contractures. Normal muscle tone.  Skin: no rashes, lesions, ulcers. No induration Neurologic: Right-sided facial droop with right-sided tongue deviation and left-sided  neglect, muscle strength 3/5 on left side compared to 5/5 on right side Psychiatric: Awake alert, somewhat confused   Labs on Admission: I have personally reviewed following labs and imaging studies  CBC: Recent Labs  Lab 07/30/2021 1248 07/24/2021 1249  WBC  --  14.2*  NEUTROABS  --  13.4*  HGB 13.3 12.3  HCT 39.0 39.5  MCV  --  93.2  PLT  --  381   Basic Metabolic Panel: Recent Labs  Lab 07/02/2021 1248 07/22/2021 1249  NA 144 143  K 3.6 3.6  CL 112* 108  CO2  --  22  GLUCOSE 162* 173*  BUN 29* 26*  CREATININE 1.10* 1.32*  CALCIUM  --  9.4   GFR: Estimated Creatinine Clearance: 23.2 mL/min (A) (by C-G formula based on SCr of 1.32 mg/dL (H)). Liver Function Tests: Recent Labs  Lab 07/05/2021 1249  AST 55*  ALT 26  ALKPHOS  47  BILITOT 0.3  PROT 8.5*  ALBUMIN 3.2*   No results for input(s): LIPASE, AMYLASE in the last 168 hours. No results for input(s): AMMONIA in the last 168 hours. Coagulation Profile: Recent Labs  Lab 08-07-21 1249  INR 1.1   Cardiac Enzymes: No results for input(s): CKTOTAL, CKMB, CKMBINDEX, TROPONINI in the last 168 hours. BNP (last 3 results) No results for input(s): PROBNP in the last 8760 hours. HbA1C: Recent Labs    07-Aug-2021 1249  HGBA1C 6.2*   CBG: Recent Labs  Lab 08/07/2021 1239  GLUCAP 153*   Lipid Profile: No results for input(s): CHOL, HDL, LDLCALC, TRIG, CHOLHDL, LDLDIRECT in the last 72 hours. Thyroid Function Tests: No results for input(s): TSH, T4TOTAL, FREET4, T3FREE, THYROIDAB in the last 72 hours. Anemia Panel: No results for input(s): VITAMINB12, FOLATE, FERRITIN, TIBC, IRON, RETICCTPCT in the last 72 hours. Urine analysis:    Component Value Date/Time   COLORURINE STRAW (A) 05/06/2020 1000   APPEARANCEUR CLEAR 05/06/2020 1000   LABSPEC 1.004 (L) 05/06/2020 1000   PHURINE 7.0 05/06/2020 1000   GLUCOSEU NEGATIVE 05/06/2020 1000   HGBUR NEGATIVE 05/06/2020 1000   BILIRUBINUR NEGATIVE 05/06/2020 1000    KETONESUR NEGATIVE 05/06/2020 1000   PROTEINUR NEGATIVE 05/06/2020 1000   NITRITE NEGATIVE 05/06/2020 1000   LEUKOCYTESUR TRACE (A) 05/06/2020 1000    Radiological Exams on Admission: CT HEAD CODE STROKE WO CONTRAST  Result Date: 08-07-21 CLINICAL DATA:  Code stroke.  Acute neuro deficit. EXAM: CT HEAD WITHOUT CONTRAST TECHNIQUE: Contiguous axial images were obtained from the base of the skull through the vertex without intravenous contrast. COMPARISON:  CT head 05/06/2020 FINDINGS: Brain: Large territory cytotoxic edema right MCA territory. Edema is present in the right frontal and parietal lobe extending into the insula and right caudate nucleus. Involvement of the anterior limb internal capsule. Ventricle size normal.  No midline shift.  No acute hemorrhage. Vascular: Atherosclerotic calcification in the cavernous carotid bilaterally. No hyperdense vessel in the M1 segment. There are hyperdense vessels in the right sylvian fissure which may be due to thrombosed MCA branches. Skull: Negative Sinuses/Orbits: Paranasal sinuses clear. Bilateral cataract extraction Other: None ASPECTS ([place] Stroke Program Early CT Score) - Ganglionic level infarction (caudate, lentiform nuclei, internal capsule, insula, M1-M3 cortex): 1 - Supraganglionic infarction (M4-M6 cortex): 0 Total score (0-10 with 10 being normal): 1 IMPRESSION: 1. Large territory acute infarct right MCA territory. No hemorrhage or midline shift. 2. ASPECTS is 1 3. Code stroke imaging results were communicated on 07-Aug-2021 at 1:04 pm to provider Bhagat via text page Electronically Signed   By: Marlan Palau M.D.   On: 08-07-21 13:07    EKG: Pending  Assessment/Plan Active Problems:   Stroke (cerebrum) (HCC)   CVA (cerebral vascular accident) (HCC)  (please populate well all problems here in Problem List. (For example, if patient is on BP meds at home and you resume or decide to hold them, it is a problem that needs to be her. Same for  CAD, COPD, HLD and so on)  Acute aphasia, right 3rd nerve palsy, right-sided facial droop and tongue deviation, and left-sided paresis -Secondary to large right MCA stroke -Given the size of stroke > one third of MCA territory, hemorrhagic transformation risk significantly high, hold off chemical DVT prophylaxis for now.  SCD for DVT prophylaxis. -Given patient also signs symptoms signs of acute CHF decompensation, will glucose permissive hypertension, will try to correct HTN, with as needed hydralazine parameter 150/100. -Aspirin -N.p.o. speech  evaluation -MRI, MRA and Echo -Check A1c and lipid panel -Given the sizable stroke and other comorbidities, long-term prognosis poor.  Discussed with patient family including daughter and niece at bedside, both expressed that they still desire patient to be Full Code.  Acute hypoxic respite failure -Chest x-ray pending -Given the nature of bilateral crackles, and strong history of noncompliant with CHF/BP medications, switch Lasix to 20 mg IV daily -Resume home BP meds when able to swallow.  As needed hydralazine for now.  Leukocytosis -Check chest x-ray to rule out aspiration PNA. -Might related to stroke itself  Fall/Acute ambulation dysfunction -Stroke, CT head no bleeding.  HTN, uncontrolled -Likely from noncompliant with BP meds -As above  CKD stage III -Signs of overload  Elevated Glucose -Check A1C   DVT prophylaxis: SCD Code Status: Full Code Family Communication: Daughter and niece at bedside Disposition Plan: Expect more than 2 midnight hospital stay Consults called: Neuro Admission status: PCU   Emeline GeneralPing T Coltin Casher MD [facility] Pager [phone]  07/19/2021, 2:36 PM

## 2021-07-15 NOTE — Progress Notes (Signed)
Patient ID: Latasha Norton, female   DOB: [birth year], 87 y.o.   MRN: [id number]      Thank you for this consult. Chart reviewed. Patient is currently in MRI. I met at bedside with daughter Pamala Hurry and granddaughter Levada Dy in efforts to arrange family meeting. Plan for [place] meeting tomorrow at 1 pm.    Elie Confer, NP-C Palliative Medicine   Please call Palliative Medicine team phone with any questions [phone]. For individual providers please see AMION.   No charge

## 2021-07-15 NOTE — Consult Note (Signed)
Neurology Consultation  Reason for Consult: Left hemiparesis, right gaze, aphasia Referring Physician: Dr. Denton Lank  CC: Left hemiparesis, right gaze, aphasia  History is obtained from: EMS, family via telephone, chart review, unable to obtain from patient due to patient's condition  HPI: Latasha Norton is a 87 y.o. right-handed woman with a medical history significant for congestive heart failure, essential hypertension, sick sinus syndrome, and chronic kidney disease stage III who presented to the ED via EMS for evaluation of left hemiparesis, forced right gaze, and aphasia. EMS states that family last spoke to Latasha Norton on the telephone at 20:00 last night and she was speaking normally without any complaints. The family attempted to contact her this morning and could not reach her via telephone so they went to her home to check on her and found her tangled up in blankets on the floor in front of her couch, aphasic, with left hemiparesis and activated EMS. EMS with an initial blood pressure of 176/76, cbg of 138, and tachypnea with SpO2 of 96% on 15L NRB.   At baseline [name] lives independently. She is able to cook, bathe, and dress herself without assistance. She walks with a walker at baseline with a slow gait. She has family nearby that checks in on her and to assist her if she needs help.   LKW: 07/14/2021 20:00 tpa given?: no, patient out of thrombolytic therapy window IR Thrombectomy? No, large territory acute RMCA territory infarct noted on CT imaging, ASPECTS of 1. Thrombectomy is not felt to benefit patient outcome due to large core.  Modified Rankin Scale: 2-Slight disability-UNABLE to perform all activities but does not need assistance  ROS: Unable to obtain due to altered mental status.   Past Medical History:  Diagnosis Date   CHF (congestive heart failure) (HCC)    Hypertension    SSS (sick sinus syndrome) (HCC)    Thyroid disease   History reviewed. No pertinent surgical  history.  Current Outpatient Medications  Medication Instructions   acetaminophen (TYLENOL) 650 mg, Oral, At bedtime PRN   acetaminophen (TYLENOL) 500 mg, Oral, Every 6 hours PRN   amLODipine (NORVASC) 5 mg, Oral, Daily, For HTN    aspirin EC 81 mg, Oral, Daily, Swallow whole.   aspirin 81 mg, Oral, Daily   cholecalciferol (VITAMIN D) 1,000 Units, Oral, Daily   diclofenac Sodium (VOLTAREN) 1 % GEL 1 application, Topical, 3 times daily PRN, Apply to affected joint   fluticasone (FLONASE) 50 MCG/ACT nasal spray 1 spray, Each Nare, Daily PRN   furosemide (LASIX) 20 mg, Oral, Daily   hydrALAZINE (APRESOLINE) 25 mg, Oral, 3 times daily   hydrochlorothiazide (MICROZIDE) 12.5 mg, Oral, Daily   levothyroxine (SYNTHROID) 150 mcg, Oral, Daily   Multiple Vitamin (MULTIVITAMIN) capsule 1 capsule, Oral, Daily   Vitamin A 2400 MCG (8000 UT) TABS 1 tablet, Oral, Daily   vitamin C 500 mg, Oral, Daily   vitamin E (VITAMIN E) 400 Units, Oral, Daily   zolpidem (AMBIEN) 2.5 mg, Oral, At bedtime PRN     No family history on file.  Social History:   reports that she has never smoked. She has never used smokeless tobacco. She reports previous alcohol use. She reports current drug use.  Medications  Current Facility-Administered Medications:    sodium chloride flush (NS) 0.9 % injection 3 mL, 3 mL, Intravenous, Once, Cathren Laine, MD  Current Outpatient Medications:    acetaminophen (TYLENOL) 325 MG tablet, Take 2 tablets (650 mg total) by mouth  at bedtime as needed (insomnia)., Disp: 30 tablet, Rfl: 1   amLODipine (NORVASC) 5 MG tablet, Take 1 tablet (5 mg total) by mouth daily. For HTN, Disp: 30 tablet, Rfl: 0   Ascorbic Acid (VITAMIN C) 1000 MG tablet, Take 500 mg by mouth daily., Disp: , Rfl:    aspirin 81 MG chewable tablet, Chew 1 tablet (81 mg total) by mouth daily., Disp: 30 tablet, Rfl: 0   bisacodyl (DULCOLAX) 10 MG suppository, Place 10 mg rectally daily as needed for moderate  constipation., Disp: , Rfl:    cholecalciferol (VITAMIN D) 25 MCG (1000 UNIT) tablet, Take 1,000 Units by mouth daily., Disp: , Rfl:    diclofenac Sodium (VOLTAREN) 1 % GEL, Apply 1 application topically 3 (three) times daily as needed. Apply to affected joint, Disp: 100 g, Rfl: 0   fluticasone (FLONASE) 50 MCG/ACT nasal spray, Place 1 spray into both nostrils daily as needed., Disp: 16 g, Rfl: 0   furosemide (LASIX) 20 MG tablet, Take 1 tablet (20 mg total) by mouth daily., Disp: 30 tablet, Rfl: 0   hydrALAZINE (APRESOLINE) 25 MG tablet, Take 25 mg by mouth 3 (three) times daily., Disp: , Rfl:    levothyroxine (SYNTHROID) 150 MCG tablet, Take 1 tablet (150 mcg total) by mouth daily., Disp: 30 tablet, Rfl: 0   magnesium hydroxide (MILK OF MAGNESIA) 400 MG/5ML suspension, Take 30 mLs by mouth daily as needed for mild constipation., Disp: , Rfl:    Multiple Vitamin (MULTIVITAMIN) capsule, Take 1 capsule by mouth daily., Disp: , Rfl:    zolpidem (AMBIEN) 5 MG tablet, Take 0.5 tablets (2.5 mg total) by mouth at bedtime as needed for sleep., Disp: 5 tablet, Rfl: 0  Exam: Current vital signs: BP (!) 163/63   Pulse 73   Temp 98.8 F (37.1 C) (Axillary)   Resp (!) 24   Ht 5\' 6"  (1.676 m)   Wt 55.2 kg   SpO2 (!) 88%   BMI 19.64 kg/m  Vital signs in last 24 hours: Temp:  [98.8 F (37.1 C)] 98.8 F (37.1 C) (07/16 1312) Pulse Rate:  [73] 73 (07/16 1315) Resp:  [24-38] 24 (07/16 1315) BP: (163-182)/(63-84) 163/63 (07/16 1315) SpO2:  [88 %] 88 % (07/16 1315) Weight:  [55.2 kg] 55.2 kg (07/16 1250)  GENERAL: Awake, alert, laying on EMS stretcher, appears ill Head: Normocephalic and atraumatic, without obvious abnormality EENT: Arcus senilis present bilaterally, normal conjunctivae, dry mucous membranes LUNGS: Tachypnea with respiratory rate in the 20's, SpO2 96% on 15L NRB CV: Regular rate on telemetry, extremities warm, well perfused without edema ABDOMEN: Soft, non-distended Ext: warm,  without obvious deformity  NEURO:  Mental Status: Awake, alert, unable to assess orientation.  Patient is mute, does not follow commands. She is unable to provide a clear or coherent history of present illness.  Left-sided neglect is noted.  Cranial Nerves:  II: PERRL4--> 2 mm/brisk.   III, IV, VI: Forced right gaze deviation, complete left hemianopsia noted.  V: Unable to assess facial sensation due to patient's condition VII: Face is asymmetric with partial paresis of left mouth with left NLF flattening VIII: Patient opens eyes to voice intermittently IX, X: Patient does not phonate or open mouth for visualization of soft palate to command.  XI: Head appears midline XII: Unable to assess due to patient's condition Motor: Right upper extremity with spontaneous and some antigravity movement but does not sustain antigravity movement on command. RUE with strong antigravity movement during examiner passive arm raise.  Right lower extremity is with some antigravity movement but does not sustain antigravity movement on command.  Left upper extremity is with minimal movement to noxious stimuli without antigravity movement.  Left lower extremity with minimal spontaneous movement without attempt at antigravity movement.  Tone is decreased on the left upper and lower extremities, tone is normal on the right upper and lower extremities. Bulk is normal.  Sensation: Unable to fully assess due to patient's mental status. Minimal movement of left upper and lower extremities elicited via application of noxious stimuli.  Coordination: Unable to assess 2/2 patient condition  DTRs: 2+ and symmetric biceps and brachioradialis Plantars: Left toes mute, right toes upgoing  Gait: Deferred  NIHSS: 1a Level of Conscious.: 1 1b LOC Questions: 2 1c LOC Commands: 1 2 Best Gaze: 2 3 Visual: 2 4 Facial Palsy: 2 5a Motor Arm - left: 3 5b Motor Arm - Right: 2 6a Motor Leg - Left: 3 6b Motor Leg - Right: 2 7  Limb Ataxia: 0 8 Sensory: 0 9 Best Language: 3 10 Dysarthria: 2 11 Extinct. and Inatten.: 1 TOTAL: 26  Labs I have reviewed labs in epic and the results pertinent to this consultation are: CBC    Component Value Date/Time   WBC 14.2 (H) 07/28/2021 1249   RBC 4.24 07/25/2021 1249   HGB 12.3 07/06/2021 1249   HCT 39.5 07/04/2021 1249   PLT 381 07/17/2021 1249   MCV 93.2 07/03/2021 1249   MCH 29.0 07/08/2021 1249   MCHC 31.1 07/11/2021 1249   RDW 14.6 07/30/2021 1249   LYMPHSABS 0.4 (L) 07/19/2021 1249   MONOABS 0.4 07/28/2021 1249   EOSABS 0.0 07/30/2021 1249   BASOSABS 0.0 07/09/2021 1249   CMP     Component Value Date/Time   NA 144 07/09/2021 1248   K 3.6 07/22/2021 1248   CL 112 (H) 07/19/2021 1248   CO2 25 05/06/2020 0916   GLUCOSE 162 (H) 07/29/2021 1248   BUN 29 (H) 07/19/2021 1248   CREATININE 1.10 (H) 07/29/2021 1248   CALCIUM 8.9 05/06/2020 0916   PROT 8.1 05/06/2020 0916   ALBUMIN 3.8 05/06/2020 0916   AST 28 05/06/2020 0916   ALT 16 05/06/2020 0916   ALKPHOS 40 05/06/2020 0916   BILITOT 0.5 05/06/2020 0916   GFRNONAA 36 (L) 05/06/2020 0916   GFRAA 41 (L) 05/06/2020 0916   Lipid Panel     Component Value Date/Time   CHOL 298 (H) 04/07/2020 0502   TRIG 89 04/07/2020 0502   HDL 83 04/07/2020 0502   CHOLHDL 3.6 04/07/2020 0502   VLDL 18 04/07/2020 0502   LDLCALC 197 (H) 04/07/2020 0502   Imaging I have reviewed the images obtained:  CT-scan of the brain 07/07/2021: 1. Large territory acute infarct right MCA territory. No hemorrhage or midline shift. 2. ASPECTS is 1  Chest x-ray concerning for pneumonia  Assessment: 87 y.o. female who presented as a Code Stroke for evaluation of left hemiparesis, forced right gaze, left facial droop, and aphasia who was found to have a large right MCA territory ischemic infarct. - Examination reveals patient with initial NIHSS of 26 with left hemiparesis, aphasia, who is mute, does not follow commands, had a right  forced gaze palsy, and left facial partial paresis.  - CT imaging with evidence of large right MCA territory infarct. tPA not given due to last known well >16 hours prior to hospital arrival. Patient not a candidate for thrombectomy with CT evidence of large core. Thrombectomy is  not felt to be beneficial to patient outcome due to large completed core infarct.  - At this time it is not clear why she has such a substantial language impairment given family reports she is right-handed and her stroke is right MCA territory.  Possibility of additional stroke not yet appreciated on head CT.  Consider possibility of metabolic encephalopathy however her creatinine is at her baseline and she does not have other very significant metabolic derangements other than mild leukocytosis likely to be a stress response - Stroke work up pending, discussed findings with patient's family.  - Etiology likely embolic. Stroke work up pending for further evaluation and identification of stroke etiology.   Impression: Acute right MCA territory large infarct Left hemiparesis Left hemianopsia Forced right gaze deviation Probable aspiration pneumonia  Recommendations: - HgbA1c, fasting lipid panel - MRI brain without contrast, MRA head and neck without contrast - Frequent neuro checks - Echocardiogram - Prophylactic therapy- Antiplatelet med: HOLD antiplatelet medications at this time due to large volume infarction - Risk factor modification - Telemetry monitoring - Blood pressure goal: permissive hypertension to 220/110 given LVO - PT consult, OT consult, Speech consult - Given severity of stroke and previously highly functional patient, palliative care consult to help with goals of care discussion suggested - MD discussed with family that the patient is at high risk for further decline due to severity of her stroke and that she is likely to remain hemiparetic on the left side with inability to see on the left side or  pain attention to the left side of the world as well as some personality change though ability to understand and communicate her needs should remain intact if she survives complications of this stroke. - CK to assess for potential rhabdomyolysis given patient was down for an unclear amount of time - Further treatment of potential pneumonia per primary team, goal normothermia to minimize secondary brain injury - Stroke team to follow   Lanae BoastStevi Toberman, AGAC-NP [facility] Pager: [phone]  Attending Neurologist's note:  I personally saw this patient, gathering history, performing a full neurologic examination, reviewing relevant labs, personally reviewing relevant imaging including head CT, and formulated the assessment and plan, adding the note above for completeness and clarity to accurately reflect my thoughts  Brooke DareSrishti Mishon Blubaugh MD-PhD [facility] [phone]  Available 7 AM to 7 PM, outside these hours please contact Neurologist on call listed on AMION

## 2021-07-15 NOTE — ED Provider Notes (Signed)
[facility] EMERGENCY DEPARTMENT Provider Note   CSN: [id number] Arrival date & time: 07/06/2021  1235  An emergency department physician performed an initial assessment on this suspected stroke patient at 1238.  History Chief Complaint  Patient presents with   Code Stroke    Latasha Norton is a 87 y.o. female.  Patient presents with acute onset altered mental status, trouble speaking, gaze to right, and left weakness. Symptoms acute onset, moderate, constant, persistent. Had spoke with family last night around 8 pm and seemed fine, at baseline. This afternoon was found on floor with above symptoms. Patient limited historian, re aphasia/dysarthria - level 5 caveat.   The history is provided by the patient, the EMS personnel and a relative. The history is limited by the condition of the patient.      Past Medical History:  Diagnosis Date   CHF (congestive heart failure) (HCC)    Hypertension    SSS (sick sinus syndrome) (HCC)    Thyroid disease     Patient Active Problem List   Diagnosis Date Noted   Rhinitis, allergic 04/19/2020   SSS (sick sinus syndrome) (HCC)    Hypokalemia 04/06/2020   Benign essential HTN 04/06/2020   Hypothyroid 04/06/2020   Acute exacerbation of CHF (congestive heart failure) (HCC) 04/06/2020   CKD (chronic kidney disease), stage III (HCC) 04/06/2020   Bradycardia 04/06/2020    History reviewed. No pertinent surgical history.   OB History   No obstetric history on file.     No family history on file.  Social History   Tobacco Use   Smoking status: Never   Smokeless tobacco: Never  Vaping Use   Vaping Use: Never used  Substance Use Topics   Alcohol use: Not Currently   Drug use: Yes    Home Medications Prior to Admission medications   Medication Sig Start Date End Date Taking? Authorizing Provider  acetaminophen (TYLENOL) 325 MG tablet Take 2 tablets (650 mg total) by mouth at bedtime as needed (insomnia).  04/15/20   Orpah Cobb, MD  amLODipine (NORVASC) 5 MG tablet Take 1 tablet (5 mg total) by mouth daily. For HTN 04/26/20   Medina-Vargas, Monina C, NP  Ascorbic Acid (VITAMIN C) 1000 MG tablet Take 500 mg by mouth daily.    [provider]  aspirin 81 MG chewable tablet Chew 1 tablet (81 mg total) by mouth daily. 05/06/20   Benjiman Core, MD  bisacodyl (DULCOLAX) 10 MG suppository Place 10 mg rectally daily as needed for moderate constipation.    [provider]  cholecalciferol (VITAMIN D) 25 MCG (1000 UNIT) tablet Take 1,000 Units by mouth daily.    [provider]  diclofenac Sodium (VOLTAREN) 1 % GEL Apply 1 application topically 3 (three) times daily as needed. Apply to affected joint 04/26/20   Medina-Vargas, Monina C, NP  fluticasone (FLONASE) 50 MCG/ACT nasal spray Place 1 spray into both nostrils daily as needed. 04/26/20   Medina-Vargas, Monina C, NP  furosemide (LASIX) 20 MG tablet Take 1 tablet (20 mg total) by mouth daily. 04/26/20   Medina-Vargas, Monina C, NP  hydrALAZINE (APRESOLINE) 25 MG tablet Take 25 mg by mouth 3 (three) times daily. 04/16/20   [provider]  levothyroxine (SYNTHROID) 150 MCG tablet Take 1 tablet (150 mcg total) by mouth daily. 04/26/20   Medina-Vargas, Monina C, NP  magnesium hydroxide (MILK OF MAGNESIA) 400 MG/5ML suspension Take 30 mLs by mouth daily as needed for mild constipation.  [provider]  Multiple Vitamin (MULTIVITAMIN) capsule Take 1 capsule by mouth daily.    [provider]  zolpidem (AMBIEN) 5 MG tablet Take 0.5 tablets (2.5 mg total) by mouth at bedtime as needed for sleep. 05/06/20   Benjiman Core, MD    Allergies    Patient has no known allergies.  Review of Systems   Review of Systems  Unable to perform ROS: Mental status change  Level 5 caveat, altered ms/aphasia   Physical Exam Updated Vital Signs BP (!) 163/63   Pulse 73   Temp 98.8 F (37.1 C) (Axillary)   Resp (!)  24   Ht 1.676 m (5\' 6" )   Wt 55.2 kg   SpO2 94%   BMI 19.64 kg/m   Physical Exam Vitals and nursing note reviewed.  Constitutional:      Appearance: Normal appearance. She is well-developed.  HENT:     Head: Atraumatic.     Nose: Nose normal.     Mouth/Throat:     Mouth: Mucous membranes are moist.  Eyes:     General: No scleral icterus.    Conjunctiva/sclera: Conjunctivae normal.     Pupils: Pupils are equal, round, and reactive to light.  Neck:     Vascular: No carotid bruit.     Trachea: No tracheal deviation.  Cardiovascular:     Rate and Rhythm: Normal rate and regular rhythm.     Pulses: Normal pulses.     Heart sounds: Normal heart sounds. No murmur heard.   No friction rub. No gallop.  Pulmonary:     Effort: Pulmonary effort is normal. No respiratory distress.     Breath sounds: Normal breath sounds.  Abdominal:     General: Bowel sounds are normal. There is no distension.     Palpations: Abdomen is soft.     Tenderness: There is no abdominal tenderness. There is no guarding.  Genitourinary:    Comments: No cva tenderness.  Musculoskeletal:        General: No swelling.     Cervical back: Normal range of motion and neck supple. No rigidity. No muscular tenderness.     Comments: CTLS spine, non tender, aligned, no step off. Good passive rom bil extremities without pain or focal bony tenderness.   Skin:    General: Skin is warm and dry.     Findings: No rash.  Neurological:     Mental Status: She is alert.     Comments: Awake and alert. Gaze to right. Expressive aphasia/dysarthria. Left weakness.   Psychiatric:        Mood and Affect: Mood normal.    ED Results / Procedures / Treatments   Labs (all labs ordered are listed, but only abnormal results are displayed) Results for orders placed or performed during the hospital encounter of 07/28/2021  Protime-INR  Result Value Ref Range   Prothrombin Time 14.1 11.4 - 15.2 seconds   INR 1.1 0.8 - 1.2  APTT   Result Value Ref Range   aPTT 28 24 - 36 seconds  CBC  Result Value Ref Range   WBC 14.2 (H) 4.0 - 10.5 K/uL   RBC 4.24 3.87 - 5.11 MIL/uL   Hemoglobin 12.3 12.0 - 15.0 g/dL   HCT 07/17/21 37.0 - 48.8 %   MCV 93.2 80.0 - 100.0 fL   MCH 29.0 26.0 - 34.0 pg   MCHC 31.1 30.0 - 36.0 g/dL   RDW 89.1 69.4 - 50.3 %   Platelets  381 150 - 400 K/uL   nRBC 0.0 0.0 - 0.2 %  Differential  Result Value Ref Range   Neutrophils Relative % 94 %   Neutro Abs 13.4 (H) 1.7 - 7.7 K/uL   Lymphocytes Relative 3 %   Lymphs Abs 0.4 (L) 0.7 - 4.0 K/uL   Monocytes Relative 3 %   Monocytes Absolute 0.4 0.1 - 1.0 K/uL   Eosinophils Relative 0 %   Eosinophils Absolute 0.0 0.0 - 0.5 K/uL   Basophils Relative 0 %   Basophils Absolute 0.0 0.0 - 0.1 K/uL   Immature Granulocytes 0 %   Abs Immature Granulocytes 0.03 0.00 - 0.07 K/uL  Comprehensive metabolic panel  Result Value Ref Range   Sodium 143 135 - 145 mmol/L   Potassium 3.6 3.5 - 5.1 mmol/L   Chloride 108 98 - 111 mmol/L   CO2 22 22 - 32 mmol/L   Glucose, Bld 173 (H) 70 - 99 mg/dL   BUN 26 (H) 8 - 23 mg/dL   Creatinine, Ser 5.36 (H) 0.44 - 1.00 mg/dL   Calcium 9.4 8.9 - 46.8 mg/dL   Total Protein 8.5 (H) 6.5 - 8.1 g/dL   Albumin 3.2 (L) 3.5 - 5.0 g/dL   AST 55 (H) 15 - 41 U/L   ALT 26 0 - 44 U/L   Alkaline Phosphatase 47 38 - 126 U/L   Total Bilirubin 0.3 0.3 - 1.2 mg/dL   GFR, Estimated 38 (L) >60 mL/min   Anion gap 13 5 - 15  CBG monitoring, ED  Result Value Ref Range   Glucose-Capillary 153 (H) 70 - 99 mg/dL  I-stat chem 8, ED  Result Value Ref Range   Sodium 144 135 - 145 mmol/L   Potassium 3.6 3.5 - 5.1 mmol/L   Chloride 112 (H) 98 - 111 mmol/L   BUN 29 (H) 8 - 23 mg/dL   Creatinine, Ser 0.32 (H) 0.44 - 1.00 mg/dL   Glucose, Bld 122 (H) 70 - 99 mg/dL   Calcium, Ion 4.82 (L) 1.15 - 1.40 mmol/L   TCO2 21 (L) 22 - 32 mmol/L   Hemoglobin 13.3 12.0 - 15.0 g/dL   HCT 50.0 37.0 - 48.8 %   CT HEAD CODE STROKE WO CONTRAST  Result Date:  2021-08-13 CLINICAL DATA:  Code stroke.  Acute neuro deficit. EXAM: CT HEAD WITHOUT CONTRAST TECHNIQUE: Contiguous axial images were obtained from the base of the skull through the vertex without intravenous contrast. COMPARISON:  CT head 05/06/2020 FINDINGS: Brain: Large territory cytotoxic edema right MCA territory. Edema is present in the right frontal and parietal lobe extending into the insula and right caudate nucleus. Involvement of the anterior limb internal capsule. Ventricle size normal.  No midline shift.  No acute hemorrhage. Vascular: Atherosclerotic calcification in the cavernous carotid bilaterally. No hyperdense vessel in the M1 segment. There are hyperdense vessels in the right sylvian fissure which may be due to thrombosed MCA branches. Skull: Negative Sinuses/Orbits: Paranasal sinuses clear. Bilateral cataract extraction Other: None ASPECTS (Alberta Stroke Program Early CT Score) - Ganglionic level infarction (caudate, lentiform nuclei, internal capsule, insula, M1-M3 cortex): 1 - Supraganglionic infarction (M4-M6 cortex): 0 Total score (0-10 with 10 being normal): 1 IMPRESSION: 1. Large territory acute infarct right MCA territory. No hemorrhage or midline shift. 2. ASPECTS is 1 3. Code stroke imaging results were communicated on 08/13/21 at 1:04 pm to provider Bhagat via text page Electronically Signed   By: Marlan Palau M.D.   On: August 13, 2021  13:07    EKG None  Radiology CT HEAD CODE STROKE WO CONTRAST  Result Date: 05/25/2021 CLINICAL DATA:  Code stroke.  Acute neuro deficit. EXAM: CT HEAD WITHOUT CONTRAST TECHNIQUE: Contiguous axial images were obtained from the base of the skull through the vertex without intravenous contrast. COMPARISON:  CT head 05/06/2020 FINDINGS: Brain: Large territory cytotoxic edema right MCA territory. Edema is present in the right frontal and parietal lobe extending into the insula and right caudate nucleus. Involvement of the anterior limb internal  capsule. Ventricle size normal.  No midline shift.  No acute hemorrhage. Vascular: Atherosclerotic calcification in the cavernous carotid bilaterally. No hyperdense vessel in the M1 segment. There are hyperdense vessels in the right sylvian fissure which may be due to thrombosed MCA branches. Skull: Negative Sinuses/Orbits: Paranasal sinuses clear. Bilateral cataract extraction Other: None ASPECTS (Alberta Stroke Program Early CT Score) - Ganglionic level infarction (caudate, lentiform nuclei, internal capsule, insula, M1-M3 cortex): 1 - Supraganglionic infarction (M4-M6 cortex): 0 Total score (0-10 with 10 being normal): 1 IMPRESSION: 1. Large territory acute infarct right MCA territory. No hemorrhage or midline shift. 2. ASPECTS is 1 3. Code stroke imaging results were communicated on 05/25/2021 at 1:04 pm to provider Bhagat via text page Electronically Signed   By: Marlan Palauharles  Clark M.D.   On: 005/26/2022 13:07    Procedures Procedures   Medications Ordered in ED Medications  sodium chloride flush (NS) 0.9 % injection 3 mL (has no administration in time range)    ED Course  I have reviewed the triage vital signs and the nursing notes.  Pertinent labs & imaging results that were available during my care of the patient were reviewed by me and considered in my medical decision making (see chart for details).    MDM Rules/Calculators/A&P                         Iv ns. Continuous pulse ox and cardiac monitoring. CBG 153. Code stroke activation prior to arrival. Stat labs and imaging ordered.   Stat neurology consultation and evaluation at bedside/stretcher.   Emergent head ct.   Labs reviewed/interpreted by me - glucose ok. Hgb normal.   CT reviewed/interpreted by me - no hem. +right mca cva.   Neurology indicates admit to medicine service for cva, and that patient is not candidate for tpa/intervention.  Hospitalist consulted for admission.  Frequent neurochecks. No change from  initial  CRITICAL CARE RE: acute cva/code stroke activation, emergent neurologic consulation. Performed by: Suzi RootsKevin E Earsie Humm Total critical care time: 40 minutes Critical care time was exclusive of separately billable procedures and treating other patients. Critical care was necessary to treat or prevent imminent or life-threatening deterioration. Critical care was time spent personally by me on the following activities: development of treatment plan with patient and/or surrogate as well as nursing, discussions with consultants, evaluation of patient's response to treatment, examination of patient, obtaining history from patient or surrogate, ordering and performing treatments and interventions, ordering and review of laboratory studies, ordering and review of radiographic studies, pulse oximetry and re-evaluation of patient's condition.  Final Clinical Impression(s) / ED Diagnoses Final diagnoses:  None    Rx / DC Orders ED Discharge Orders     None        Cathren LaineSteinl, Rowe Warman, MD 2021/02/28 1354

## 2021-07-16 ENCOUNTER — Inpatient Hospital Stay (HOSPITAL_COMMUNITY): Payer: Medicare Other

## 2021-07-16 DIAGNOSIS — R1310 Dysphagia, unspecified: Secondary | ICD-10-CM

## 2021-07-16 DIAGNOSIS — R0682 Tachypnea, not elsewhere classified: Secondary | ICD-10-CM | POA: Diagnosis not present

## 2021-07-16 DIAGNOSIS — Z7189 Other specified counseling: Secondary | ICD-10-CM

## 2021-07-16 DIAGNOSIS — Z515 Encounter for palliative care: Secondary | ICD-10-CM | POA: Diagnosis not present

## 2021-07-16 DIAGNOSIS — I639 Cerebral infarction, unspecified: Secondary | ICD-10-CM

## 2021-07-16 LAB — BLOOD GAS, VENOUS
Acid-base deficit: 3 mmol/L — ABNORMAL HIGH (ref 0.0–2.0)
Bicarbonate: 20.6 mmol/L (ref 20.0–28.0)
Drawn by: 1679
O2 Saturation: 65.7 %
Patient temperature: 36.5
pCO2, Ven: 30.6 mmHg — ABNORMAL LOW (ref 44.0–60.0)
pH, Ven: 7.44 — ABNORMAL HIGH (ref 7.250–7.430)
pO2, Ven: 35.2 mmHg (ref 32.0–45.0)

## 2021-07-16 LAB — CBC WITH DIFFERENTIAL/PLATELET
Abs Immature Granulocytes: 0.04 10*3/uL (ref 0.00–0.07)
Basophils Absolute: 0 10*3/uL (ref 0.0–0.1)
Basophils Relative: 0 %
Eosinophils Absolute: 0 10*3/uL (ref 0.0–0.5)
Eosinophils Relative: 0 %
HCT: 38.7 % (ref 36.0–46.0)
Hemoglobin: 12.5 g/dL (ref 12.0–15.0)
Immature Granulocytes: 0 %
Lymphocytes Relative: 6 %
Lymphs Abs: 0.7 10*3/uL (ref 0.7–4.0)
MCH: 28.9 pg (ref 26.0–34.0)
MCHC: 32.3 g/dL (ref 30.0–36.0)
MCV: 89.6 fL (ref 80.0–100.0)
Monocytes Absolute: 0.8 10*3/uL (ref 0.1–1.0)
Monocytes Relative: 7 %
Neutro Abs: 10.4 10*3/uL — ABNORMAL HIGH (ref 1.7–7.7)
Neutrophils Relative %: 87 %
Platelets: 386 10*3/uL (ref 150–400)
RBC: 4.32 MIL/uL (ref 3.87–5.11)
RDW: 14.6 % (ref 11.5–15.5)
WBC: 11.9 10*3/uL — ABNORMAL HIGH (ref 4.0–10.5)
nRBC: 0 % (ref 0.0–0.2)

## 2021-07-16 LAB — BASIC METABOLIC PANEL
Anion gap: 11 (ref 5–15)
BUN: 31 mg/dL — ABNORMAL HIGH (ref 8–23)
CO2: 21 mmol/L — ABNORMAL LOW (ref 22–32)
Calcium: 9.3 mg/dL (ref 8.9–10.3)
Chloride: 111 mmol/L (ref 98–111)
Creatinine, Ser: 1.4 mg/dL — ABNORMAL HIGH (ref 0.44–1.00)
GFR, Estimated: 35 mL/min — ABNORMAL LOW (ref >60)
Glucose, Bld: 136 mg/dL — ABNORMAL HIGH (ref 70–99)
Potassium: 4.4 mmol/L (ref 3.5–5.1)
Sodium: 143 mmol/L (ref 135–145)

## 2021-07-16 LAB — URINALYSIS, ROUTINE W REFLEX MICROSCOPIC
Bilirubin Urine: NEGATIVE
Glucose, UA: NEGATIVE mg/dL
Ketones, ur: NEGATIVE mg/dL
Nitrite: NEGATIVE
Protein, ur: 100 mg/dL — AB
Specific Gravity, Urine: 1.016 (ref 1.005–1.030)
WBC, UA: >50 WBC/hpf — ABNORMAL HIGH (ref 0–5)
pH: 5 (ref 5.0–8.0)

## 2021-07-16 LAB — TROPONIN I (HIGH SENSITIVITY)
Troponin I (High Sensitivity): 9792 ng/L (ref <18)
Troponin I (High Sensitivity): 9432 ng/L (ref <18)
Troponin I (High Sensitivity): 9052 ng/L (ref <18)

## 2021-07-16 LAB — ECHOCARDIOGRAM COMPLETE
Height: 66 in
MV M vel: 5.15 m/s
MV Peak grad: 106.1 mmHg
P 1/2 time: 283 msec
S' Lateral: 4.2 cm
Weight: 1947.1 oz

## 2021-07-16 LAB — HEMOGLOBIN A1C
Hgb A1c MFr Bld: 6.2 % — ABNORMAL HIGH (ref 4.8–5.6)
Mean Plasma Glucose: 131.24 mg/dL

## 2021-07-16 LAB — LIPID PANEL
Cholesterol: 164 mg/dL (ref 0–200)
HDL: 64 mg/dL (ref >40)
LDL Cholesterol: 87 mg/dL (ref 0–99)
Total CHOL/HDL Ratio: 2.6 RATIO
Triglycerides: 67 mg/dL (ref <150)
VLDL: 13 mg/dL (ref 0–40)

## 2021-07-16 LAB — CK: Total CK: 1118 U/L — ABNORMAL HIGH (ref 38–234)

## 2021-07-16 LAB — SARS CORONAVIRUS 2 (TAT 6-24 HRS): SARS Coronavirus 2: NEGATIVE

## 2021-07-16 MED ORDER — SODIUM CHLORIDE 0.9 % IV SOLN: INTRAVENOUS | Status: DC

## 2021-07-16 MED ORDER — SODIUM CHLORIDE 0.9 % IV SOLN
1.0000 g | INTRAVENOUS | Status: DC
  Administered 2021-07-16 – 2021-07-19 (×4): 1 g via INTRAVENOUS
  Filled 2021-07-16 (×4): qty 10

## 2021-07-16 MED ORDER — HYDRALAZINE HCL 20 MG/ML IJ SOLN: 5.0000 mg | Freq: Four times a day (QID) | INTRAMUSCULAR | Status: DC | PRN

## 2021-07-16 MED ORDER — CARVEDILOL 3.125 MG PO TABS: 3.1250 mg | ORAL_TABLET | Freq: Two times a day (BID) | ORAL | Status: DC

## 2021-07-16 MED ORDER — LABETALOL HCL 5 MG/ML IV SOLN: 5.0000 mg | INTRAVENOUS | Status: DC | PRN

## 2021-07-16 MED ORDER — ASPIRIN 300 MG RE SUPP
150.0000 mg | Freq: Every day | RECTAL | Status: DC
  Administered 2021-07-16 – 2021-07-18 (×3): 150 mg via RECTAL
  Filled 2021-07-16 (×3): qty 1

## 2021-07-16 MED ORDER — SENNOSIDES-DOCUSATE SODIUM 8.6-50 MG PO TABS: 1.0000 | ORAL_TABLET | Freq: Every evening | ORAL | Status: DC | PRN

## 2021-07-16 MED ORDER — LEVOTHYROXINE SODIUM 100 MCG/5ML IV SOLN
75.0000 ug | Freq: Every day | INTRAVENOUS | Status: DC
  Administered 2021-07-19 – 2021-07-20 (×2): 75 ug via INTRAVENOUS
  Filled 2021-07-16 (×2): qty 5

## 2021-07-16 MED ORDER — SODIUM CHLORIDE 0.9 % IV SOLN
500.0000 mg | INTRAVENOUS | Status: DC
  Administered 2021-07-16 – 2021-07-17 (×2): 500 mg via INTRAVENOUS
  Filled 2021-07-16 (×2): qty 500

## 2021-07-16 NOTE — Progress Notes (Signed)
  Echocardiogram 2D Echocardiogram has been performed.  Delcie Roch 07/16/2021, 3:28 PM

## 2021-07-16 NOTE — Progress Notes (Signed)
STROKE TEAM PROGRESS NOTE   INTERVAL HISTORY Patient presented with left hemiplegia, right gaze deviation and aphasia with last seen normal more than 12 hours from night prior and CT scan showing large right MCA infarct with cytotoxic edema and mild midline shift.  She is not a candidate for thrombectomy or tPA.  Prognosis is quite poor.  As her NIH stroke scale is 26 and CT scan ASPECT score was 1 at baseline she was quite independent.  Family has not yet made decision about goals of care that she is unlikely to be able to go back home.  MRI scan shows a large evolving right middle cerebral artery infarct with associated mild cytotoxic edema and age-related volume loss and changes of small vessel disease.  MR angiogram of the brain is significantly motion degraded but shows right M1 occlusion.  MRI of the neck is also degraded by motion but shows no major stenosis in either carotid arteries.  Echocardiogram shows diminished ejection fraction of 25 to 30% with severely decreased left ventricular function and global hypokinesis.which is significantly diminished from previous echocardiogram from 04/07/2020 when EF was 45 to 50%.  Carotid ultrasound shows no significant extracranial stenosis on either side.  Vitals:   07/16/21 0721 07/16/21 1110 07/16/21 1527 07/16/21 1539  BP:  (!) 155/85 (!) 151/72 (!) 168/75  Pulse:  94 (!) 102 (!) 102  Resp: (!) 25 (!) 21 (!) 33 (!) 27  Temp:  99 F (37.2 C) (!) 101 F (38.3 C) 99.8 F (37.7 C)  TempSrc:  Oral Oral Axillary  SpO2:  100% 100% 100%  Weight:      Height:       CBC:  Recent Labs  Lab August 11, 2021 1249 07/16/21 0259  WBC 14.2* 11.9*  NEUTROABS 13.4* 10.4*  HGB 12.3 12.5  HCT 39.5 38.7  MCV 93.2 89.6  PLT 381 386   Basic Metabolic Panel:  Recent Labs  Lab Aug 11, 2021 1249 07/16/21 0259  NA 143 143  K 3.6 4.4  CL 108 111  CO2 22 21*  GLUCOSE 173* 136*  BUN 26* 31*  CREATININE 1.32* 1.40*  CALCIUM 9.4 9.3   Lipid Panel:  Recent Labs   Lab 07/16/21 0259  CHOL 164  TRIG 67  HDL 64  CHOLHDL 2.6  VLDL 13  LDLCALC 87   HgbA1c:  Recent Labs  Lab 07/16/21 0259  HGBA1C 6.2*   Urine Drug Screen: No results for input(s): LABOPIA, COCAINSCRNUR, LABBENZ, AMPHETMU, THCU, LABBARB in the last 168 hours.  Alcohol Level No results for input(s): ETH in the last 168 hours.  IMAGING past 24 hours MR ANGIO HEAD WO CONTRAST  Result Date: 08/11/2021 IMPRESSION: 1. Technically limited exam due to motion and lack of IV contrast. 2. Gross patency of both carotid artery systems and vertebral arteries to the skull base. No visible stenosis or other vascular abnormality.   MR ANGIO NECK WO CONTRAST  Result Date: 08-11-2021 MRA NECK IMPRESSION: 1. Technically limited exam due to motion and lack of IV contrast. 2. Gross patency of both carotid artery systems and vertebral arteries to the skull base. No visible stenosis or other vascular abnormality. Electronically Signed   By: Rise Mu M.D.   On: 08/11/21 20:47   MR BRAIN WO CONTRAST  Result Date: August 11, 2021 IMPRESSION:  1. Large evolving acute ischemic right MCA territory infarct. No associated hemorrhage or significant regional mass effect at this time. 2. Underlying mild age-related cerebral volume loss with chronic small vessel ischemic disease.  D  Result Date: 07/16/2021 Chest x-ray IMPRESSION: 1. Improving aeration with what appears to be resolving multilobar bilateral pneumonia. 2. Moderate cardiomegaly. 3. Aortic atherosclerosis. Electronically Signed   By: Trudie Reed M.D.   On: 07/16/2021 10:17   ECHOCARDIOGRAM COMPLETE  Result Date: 07/16/2021    ECHOCARDIOGRAM REPORT   Patient Name:   Latasha Norton Date of Exam: IMPRESSIONS   1. Left ventricular ejection fraction, by estimation, is 25 to 30%. The left ventricle has severely decreased function. The left ventricle demonstrates global hypokinesis. There is mild concentric left ventricular hypertrophy. Left  ventricular diastolic  parameters are consistent with Grade I diastolic dysfunction (impaired relaxation). There is severe hypokinesis of the left ventricular, entire septal wall and inferior wall.   2. Right ventricular systolic function is normal. The right ventricular size is normal. There is mildly elevated pulmonary artery systolic pressure.   3. Left atrial size was severely dilated.   4. Right atrial size was mildly dilated.   5. The mitral valve is degenerative. Moderate to severe mitral valve regurgitation. Moderate mitral annular calcification.   6. The aortic valve is tricuspid. There is mild calcification of the aortic valve. There is mild thickening of the aortic valve. Aortic valve regurgitation is moderate.   7. There is mild (Grade II) atheroma plaque involving the aortic root and ascending aorta.   8. The inferior vena cava is normal in size with <50% respiratory variability, suggesting right atrial pressure of 8 mmHg.   VAS US CAROTID  Result Date: 07/16/2021 Summary: Right Carotid: Velocities in the right ICA are consistent with a 1-39% stenosis. Left Carotid: Velocities in the left ICA are consistent with a 1-39% stenosis. Vertebrals:  Bilateral vertebral arteries demonstrate antegrade flow. Subclavians: Normal flow hemodynamics were seen in bilateral subclavian              arteries. *See table(s) above for measurements and observations.     Preliminary     PHYSICAL EXAM Frail malnourished looking elderly African-American lady lying in bed in mild distress. . Afebrile. Head is nontraumatic. Neck is supple without bruit.    Cardiac exam no murmur or gallop. Lungs are clear to auscultation. Distal pulses are well felt.  Neurological Exam Patient is drowsy but arouses to stimuli.  She has right gaze deviation and is unable to look to the left.  She slightly blinks to threat on the right but not on the left.  She has severe dysarthria but can speak a few clear words and occasional  short sentences but is difficult to understand.  Left lower facial weakness.  Tongue midline.  She has dense left hemiplegia with left upper extremity which is flaccid and hypotonia.  She does withdraw the left lower extremity to painful stimuli but not the left upper extremity.  She has purposeful right upper and lower extremity movements but does not follow commands consistently.  Gait not tested  ASSESSMENT/PLAN Latasha Norton is a 87 y.o. female with history of congestive heart failure, essential hypertension, sick sinus syndrome, and chronic kidney disease stage III who presented with left hemiparesis, forced right gaze, and aphasia.   Stroke:  right MCA stroke due to right M1 occlusion likely of cardiogenic etiology from cardiomyopathy and low ejection fraction Code Stroke CT head: large territory acute infarct right MCA territory. No hemorrhage or midline shift. ASPECTS is 1 MRI  Large evolving acute ischemic right MCA territory infarct MRA  No visible stenosis or vascular abnormality Carotid Doppler  no significant stenosis 2D Echo: 25 to 30%. The left ventricle has severely decreased function. The left ventricle demonstrates global hypokinesis. There is mild concentric left ventricular hypertrophy. LDL 87 HgbA1c 6.2 VTE prophylaxis - SCDs    Diet   Diet NPO time specified  May need corpak for medication and tube feeds aspirin 81 mg daily prior to admission, now on aspirin 150mg suppository.  Therapy recommendations:  SNF Disposition:  pending  Hypertension Home meds:  amlodipine 5mg  daily, hydralazine 25mg  TID, HCTZ 12.5mg  daily Unstable Permissive hypertension (OK if < 220/120) but gradually normalize in 5-7 days Long-term BP goal normotensive  Hyperlipidemia Home meds:  none LDL 87, goal < 70 Add atorvastatin once NG tube access obtained   Continue statin at discharge  Diabetes type II Controlled Home meds:  none HgbA1c 6.2, goal < 7.0 CBGs Recent Labs     07/02/2021 1239  GLUCAP 153*    SSI  Other Stroke Risk Factors Advanced Age >/= 72  Congestive heart failure  Other Active Problems Chronic kidney disease stage III Sick sinus syndrome Hypothyroidism: synthroid daily  Hospital day # 1  Lissy Olivencia-Simmons, ACNP-BC Stroke NP  I have personally obtained history,examined this patient, reviewed notes, independently viewed imaging studies, participated in medical decision making and plan of care.ROS completed by me personally and pertinent positives fully documented  I have made any additions or clarifications directly to the above note. Agree with note above.  Patient unfortunately has presented with a large right middle cerebral artery infarct with involvement of almost the entire territory with NIH stroke scale of 26 and CT scan showing aspect score of 1.  She was independent prior to the stroke but unfortunately is unlikely to survive this and make meaningful improvement given her advanced age.  Recommend discussion with family about goals of care.  She is likely going to need a feeding tube and prolonged stay in nursing home which she may not want.  Recommend palliative care consult.  Discussed with Dr. 07/17/21.  No family available at the bedside for discussion.  Greater than 50% time during this 35-minute visit was spent on counseling and coordination of care and discussion with care team about her large stroke and prognosis and answering questions  76, MD Medical Director Stroke Center Pager: [phone] 07/16/2021 4:47 PM  To contact Stroke Continuity provider, please refer to [facility]. After hours, contact General Neurology

## 2021-07-16 NOTE — Plan of Care (Signed)

## 2021-07-16 NOTE — Evaluation (Signed)
Clinical/Bedside Swallow Evaluation Patient Details  Name: Latasha Norton MRN: [id number] Date of Birth: 1925/12/20  Today's Date: 07/16/2021 Time: SLP Start Time (ACUTE ONLY): 1200 SLP Stop Time (ACUTE ONLY): 1210 SLP Time Calculation (min) (ACUTE ONLY): 10 min  Past Medical History:  Past Medical History:  Diagnosis Date   CHF (congestive heart failure) (HCC)    Hypertension    SSS (sick sinus syndrome) (HCC)    Thyroid disease    Past Surgical History: History reviewed. No pertinent surgical history. HPI:  Pt is a 87 y/o female admitted secondary to L sided weakness, R gaze and difficulty with speech. MRI of the brain revealed an acute evolving R MCA infarct. PMH: chronic systolic CHF, LVEF 45 to 50% 2021, CKD stage III, HTN, hypothyroidism.   Assessment / Plan / Recommendation Clinical Impression  Pt was seen for bedside swallow evaluation. She was unable to provide any meaningful history or consistently follow commands. Alertness was adequate for p.o. intake, but suboptimal. Oral mechanism exam was limited due to pt's difficulty following commands; however, left-sided facial weakness was noted and pt appeared to be edentulous. Pt demonstrated impaired bolus awareness with no lingual manipulation or deglutition which did not improve with verbal prompts, tactile cues, or even when pt was given the water which she requested twice during the evaluation. Anterior spillage was noted across consistencies. Pt does not present as a candidate for safe oral intake at this time, and it is therefore recommended that the NPO status be maintained. SLP will follow to assess improvement in swallow function. SLP Visit Diagnosis: Dysphagia, unspecified (R13.10)    Aspiration Risk  Severe aspiration risk;Risk for inadequate nutrition/hydration    Diet Recommendation NPO   Medication Administration: Via alternative means    Other  Recommendations Oral Care Recommendations: Oral care BID   Follow up  Recommendations Skilled Nursing facility      Frequency and Duration min 2x/week  2 weeks       Prognosis Prognosis for Safe Diet Advancement: Fair Barriers to Reach Goals: Cognitive deficits;Severity of deficits      Swallow Study   General Date of Onset: 07/11/2021 HPI: Pt is a 87 y/o female admitted secondary to L sided weakness, R gaze and difficulty with speech. MRI of the brain revealed an acute evolving R MCA infarct. PMH: chronic systolic CHF, LVEF 45 to 50% 2021, CKD stage III, HTN, hypothyroidism. Type of Study: Bedside Swallow Evaluation Previous Swallow Assessment: none Diet Prior to this Study: NPO Temperature Spikes Noted: No Respiratory Status: Nasal cannula History of Recent Intubation: No Behavior/Cognition: Cooperative;Pleasant mood Oral Cavity Assessment: Within Functional Limits Oral Care Completed by SLP: Recent completion by staff Oral Cavity - Dentition: Edentulous Self-Feeding Abilities: Total assist Patient Positioning: Upright in bed;Postural control adequate for testing Baseline Vocal Quality: Low vocal intensity;Hoarse Volitional Cough: Cognitively unable to elicit Volitional Swallow: Unable to elicit    Oral/Motor/Sensory Function Overall Oral Motor/Sensory Function: Moderate impairment Facial ROM: Reduced left;Suspected CN VII (facial) dysfunction Facial Symmetry: Abnormal symmetry left;Suspected CN VII (facial) dysfunction Facial Strength: Reduced left;Suspected CN VII (facial) dysfunction   Ice Chips Ice chips: Impaired Presentation: Spoon Oral Phase Impairments: Impaired mastication;Poor awareness of bolus;Reduced lingual movement/coordination;Reduced labial seal Oral Phase Functional Implications: Left anterior spillage   Thin Liquid Thin Liquid: Impaired Presentation: Spoon;Cup;Straw Oral Phase Impairments: Poor awareness of bolus;Reduced lingual movement/coordination;Reduced labial seal Oral Phase Functional Implications: Left anterior  spillage    Nectar Thick Nectar Thick Liquid: Not tested   Honey Thick  Honey Thick Liquid: Not tested   Puree Puree: Impaired Presentation: Spoon Oral Phase Impairments: Poor awareness of bolus;Reduced lingual movement/coordination Oral Phase Functional Implications: Left anterior spillage   Solid     Solid: Not tested     Latasha Norton I. Vear Clock, MS, CCC-SLP Acute Rehabilitation Services Office number [phone] Pager [phone]  Latasha Norton 07/16/2021,1:12 PM

## 2021-07-16 NOTE — Progress Notes (Signed)
Paged Dr. Rhona Leavens to advise critical lab troponin 9792, SBP 164, hold hydralizine or d/c order? Dr. Rhona Leavens stated hold hydralizine at this time, will d/c.

## 2021-07-16 NOTE — Progress Notes (Signed)
Resp remain upper 20s-mid 30s, as has been since came in to ED. Resp status is unchanged and has been addressed prior to coming to the floor.  This nurse did page Dr Rachael Darby to notify him and he reviewed notes and said he would order blood gases and also he would order antibiotics because her CXR showed some possible pneumonia.  She is afebrile and appears to be resting at this time but resp remain tachypneic.  Lungs sound diminished.

## 2021-07-16 NOTE — Evaluation (Signed)
Occupational Therapy Evaluation Patient Details Name: Latasha Norton MRN: [id number] DOB: Oct 11, 1925 Today's Date: 07/16/2021    History of Present Illness Pt is a 87 y/o female admitted secondary to L sided weakness, R gaze and aphasia.Found down by family at pt's home. MRI of the brain revealed an acute evolving R MCA infarct. PMH including but not limited to chronic systolic CHF, LVEF 45 to 50% 2021, CKD stage III, HTN, hypothyroidism.   Clinical Impression   This 87 yo female admitted and found to have above presents to acute OT with PLOF of living along with family checking in on her every day several times. Currently pt is total A for all basic ADLs and bed mobility. Pt will continue to benefit from acute OT with follow up at SNF.    Follow Up Recommendations  SNF;Supervision/Assistance - 24 hour    Equipment Recommendations  Other (comment) (TBD next venue)       Precautions / Restrictions Precautions Precautions: Fall Restrictions Weight Bearing Restrictions: No      Mobility Bed Mobility Overal bed mobility: Needs Assistance Bed Mobility: Rolling;Supine to Sit;Sit to Supine     Supine to sit: Total assist Sit to supine: Total assist              Balance Overall balance assessment: Needs assistance   Sitting balance-Leahy Scale: Zero Sitting balance - Comments: left lateral and posterior lean                                   ADL either performed or assessed with clinical judgement   ADL Overall ADL's : Needs assistance/impaired                                       General ADL Comments: Total A at bed level     Vision   Additional Comments: dtr reports vision "not good" prior to admission; pt kept her eyes shut 95% of session                Pertinent Vitals/Pain Pain Assessment: Faces Faces Pain Scale: Hurts even more Pain Location: right knee with movement Pain Descriptors / Indicators: Sore;Aching Pain  Intervention(s): Limited activity within patient's tolerance;Monitored during session;Repositioned     Hand Dominance Right   Extremity/Trunk Assessment Upper Extremity Assessment Upper Extremity Assessment: RUE deficits/detail;LUE deficits/detail RUE Deficits / Details: Can open and close hand and thumbs up on command---sometimes needing increased time. Can only raise at shoulder ~20 degrees (but seems more shoulder related than neurologically related). Can raise it higher with PROM and hold momentarily RUE Coordination: decreased gross motor LUE Deficits / Details: No AROM noted, no response to noxious stimuli, PROM WNL LUE Sensation: decreased light touch LUE Coordination: decreased gross motor;decreased fine motor           Communication Communication Communication: Expressive difficulties;Receptive difficulties;HOH   Cognition Arousal/Alertness: Awake/alert (eyes closed most of the session, did open them towards end of session when dtr came in) Behavior During Therapy: Flat affect Overall Cognitive Status: History of cognitive impairments - at baseline                                 General Comments: Pt followed 50% of one step commands sometimes needing increased time  Home Living Family/patient expects to be discharged to:: Private residence Living Arrangements: Alone Available Help at Discharge: Family;Available PRN/intermittently                             Additional Comments: Pt's dtr reports pt lived alone and mulitple family members would check on her daily by phone and/or in person      Prior Functioning/Environment Level of Independence: Independent                 OT Problem List: Decreased strength;Decreased range of motion;Impaired balance (sitting and/or standing);Impaired vision/perception;Impaired UE functional use;Impaired tone;Impaired sensation;Pain;Decreased cognition;Decreased safety  awareness      OT Treatment/Interventions: Self-care/ADL training;DME and/or AE instruction;Patient/family education;Balance training;Visual/perceptual remediation/compensation    OT Goals(Current goals can be found in the care plan section) Acute Rehab OT Goals Patient Stated Goal: to get some water OT Goal Formulation: Patient unable to participate in goal setting Time For Goal Achievement: 07/30/21 Potential to Achieve Goals: Fair  OT Frequency: Min 2X/week    AM-PAC OT "6 Clicks" Daily Activity     Outcome Measure Help from another person eating meals?: Total Help from another person taking care of personal grooming?: Total Help from another person toileting, which includes using toliet, bedpan, or urinal?: Total Help from another person bathing (including washing, rinsing, drying)?: Total Help from another person to put on and taking off regular upper body clothing?: Total Help from another person to put on and taking off regular lower body clothing?: Total 6 Click Score: 6   End of Session Nurse Communication: Mobility status (needs a new pure wick)  Activity Tolerance: Patient limited by fatigue Patient left: in bed;with call bell/phone within reach;with bed alarm set;with family/visitor present  OT Visit Diagnosis: Unsteadiness on feet (R26.81);Other abnormalities of gait and mobility (R26.89);Muscle weakness (generalized) (M62.81);Pain Pain - Right/Left: Right Pain - part of body:  (knee)                Time: 8786-7672 OT Time Calculation (min): 26 min Charges:  OT General Charges $OT Visit: 1 Visit OT Evaluation $OT Eval Moderate Complexity: 1 Mod OT Treatments $Self Care/Home Management : 8-22 mins  Ignacia Palma, OTR/L Acute [employer] Pager [phone] Office [phone]    Latasha Norton 07/16/2021, 1:39 PM

## 2021-07-16 NOTE — Progress Notes (Signed)
tele called to report ST elevation of 3.7.  CK 1,012 last noc & 1,118 this am. SBP > 150 so will give hydralazine. Notified Dr Rachael Darby by Loretha Stapler with this info.  She is resting quietly and denies pain or discomfort.  No resp distress at this time.  O2 on at 2L/[place].

## 2021-07-16 NOTE — Progress Notes (Signed)
Did page Dr Rachael Darby to see if wants an EKG since the ST elevation is shown on tele.  Also asked MD to please change prn Hydralazine order perimeters since he did give me a verbal order not to treat the SBP that was greater than 160 (as I was going to do because there is a current order in to treat SBP > 150).  I reminded him that if not, others might treat it because the order is still active.

## 2021-07-16 NOTE — NC FL2 (Signed)
[place] MEDICAID [id number] LEVEL OF CARE SCREENING TOOL     IDENTIFICATION  Patient Name: Latasha Norton Birthdate: February 12, 1925 Sex: female Admission Date (Current Location): 07/06/2021  [place] and [place] Number:  [facility] Bast [id number] S Facility and Address:  [facility], [address], [place], [place] [address]      Provider Number: [id number]  Attending Physician Name and Address:  Jerald Kief, MD  Relative Name and Phone Number:  Vivien Rossetti Daughter [phone]    Current Level of Care: Hospital Recommended Level of Care: Skilled Nursing Facility Prior Approval Number:    Date Approved/Denied:   PASRR Number: [id number]  Discharge Plan: SNF    Current Diagnoses: Patient Active Problem List   Diagnosis Date Noted   Stroke (cerebrum) (HCC) 07/01/2021   Acute ischemic stroke (HCC) 07/22/2021   Rhinitis, allergic 04/19/2020   SSS (sick sinus syndrome) (HCC)    Hypokalemia 04/06/2020   Benign essential HTN 04/06/2020   Hypothyroid 04/06/2020   Acute exacerbation of CHF (congestive heart failure) (HCC) 04/06/2020   CKD (chronic kidney disease), stage III (HCC) 04/06/2020   Bradycardia 04/06/2020    Orientation RESPIRATION BLADDER Height & Weight     Self  O2 Incontinent Weight: 121 lb 11.1 oz (55.2 kg) Height:  5\' 6"  (167.6 cm)  BEHAVIORAL SYMPTOMS/MOOD NEUROLOGICAL BOWEL NUTRITION STATUS      Continent Diet (see discharge summary)  AMBULATORY STATUS COMMUNICATION OF NEEDS Skin   Total Care Verbally Normal                       Personal Care Assistance Level of Assistance  Bathing, Feeding, Dressing, Total care Bathing Assistance: Maximum assistance Feeding assistance: Maximum assistance Dressing Assistance: Maximum assistance Total Care Assistance: Maximum assistance   Functional Limitations Info  Sight, Hearing, Speech Sight Info: Adequate Hearing Info: Adequate Speech Info: Impaired    SPECIAL CARE FACTORS  FREQUENCY  PT (By licensed PT), OT (By licensed OT), Speech therapy     PT Frequency: 5x week OT Frequency: 5x week     Speech Therapy Frequency: 2x week      Contractures Contractures Info: Not present    Additional Factors Info  Code Status, Allergies Code Status Info: full Allergies Info: NKA           Current Medications (07/16/2021):  This is the current hospital active medication list Current Facility-Administered Medications  Medication Dose Route Frequency Provider Last Rate Last Admin    stroke: mapping our early stages of recovery book   Does not apply Once 07/18/2021, Ping T, MD       0.9 %  sodium chloride infusion   Intravenous Continuous 03-25-1985, MD 50 mL/hr at 07/16/21 1304 New Bag at 07/16/21 1304   acetaminophen (TYLENOL) tablet 650 mg  650 mg Oral Q4H PRN 07/18/21, MD       Or   acetaminophen (TYLENOL) 160 MG/5ML solution 650 mg  650 mg Per Tube Q4H PRN Emeline General T, MD       Or   acetaminophen (TYLENOL) suppository 650 mg  650 mg Rectal Q4H PRN Mikey College T, MD       acetaminophen (TYLENOL) tablet 500 mg  500 mg Oral Q6H PRN Mikey College T, MD       aspirin suppository 150 mg  150 mg Rectal Daily Mikey College, MD       azithromycin [facility]) 500 mg in sodium chloride 0.9 % 250  mL IVPB  500 mg Intravenous Q24H Chotiner, Claudean Severance, MD   Stopped at 07/16/21 0327   cefTRIAXone (ROCEPHIN) 1 g in sodium chloride 0.9 % 100 mL IVPB  1 g Intravenous Q24H Chotiner, Claudean Severance, MD   Stopped at 07/16/21 0239   furosemide (LASIX) injection 20 mg  20 mg Intravenous Daily Mikey College T, MD   20 mg at 07/16/21 1009   hydrALAZINE (APRESOLINE) injection 5 mg  5 mg Intravenous Q6H PRN Jerald Kief, MD       [START ON 07/19/2021] levothyroxine (SYNTHROID, LEVOTHROID) injection 75 mcg  75 mcg Intravenous Daily Jerald Kief, MD       senna-docusate (Senokot-S) tablet 1 tablet  1 tablet Oral QHS PRN Mikey College T, MD       sodium chloride flush (NS) 0.9 %  injection 3 mL  3 mL Intravenous Once Cathren Laine, MD         Discharge Medications: Please see discharge summary for a list of discharge medications.  Relevant Imaging Results:  Relevant Lab Results:   Additional Information [id number]. Pt is not vaccinated for covid.  Lorri Frederick, LCSW

## 2021-07-16 NOTE — Progress Notes (Signed)
PROGRESS NOTE    Latasha Norton  WGN:[id number] DOB: 1925-02-07 DOA: 07/01/2021 PCP: Patient, No Pcp Per (Inactive)    Brief Narrative:  87 y.o. female with medical history significant of chronic systolic CHF, LVEF 45 to 50% 2021, CKD stage III, HTN, hypothyroidism, presented with new onset of left-sided weakness, left-sided neglect and speech problem. Pt was found to have large R sided CVA and NSTEMI.  Assessment & Plan:   Active Problems:   Stroke (cerebrum) (HCC)   Acute ischemic stroke (HCC)  Acute aphasia, right 3rd nerve palsy, right-sided facial droop and tongue deviation, and left-sided paresis -Secondary to large right MCA stroke -Given the size of stroke > one third of MCA territory, hemorrhagic transformation risk significantly high, hold off chemical DVT prophylaxis for now.  SCD for DVT prophylaxis. -Allowing permissive hypertension -Aspirin suppository ordered -did not pass swallow eval, remains NPO -Check A1c of 6.2 -Lipid panel of 82 -Seen by Neurology.   Acute hypoxic respite failure -Chest x-ray pending -Given the nature of bilateral crackles, and strong history of noncompliant with CHF/BP medications, continued on Lasix to 20 mg IV daily -Wean O2 as tolerated   Pnemonia with sepsis present on admission -CXR reviewed, suggestive of pna. Pt with leukocytosis, fevers, tachypnea -Pt is continued on azithromycin and rocephin    HTN, uncontrolled -Likely from noncompliant with BP meds -Currently continued on permissive HTN, but will likely begin normalizing soon   CKD stage III -Signs of overload, cont lasix per above   Elevated Glucose -a1c of 6.2  NSTEMI -trop peaking to 9792 at time of presentation -Pt denies chest pain or sob -repeat trop down to 9432 -Have consulted Cardiology, appreciate recs. Recommendation for trial of low dose coreg and focus on Palliative Care -Appreciate input by Palliative Care   DVT prophylaxis: SCD's Code Status:  Full Family Communication: Pt in room, family is at bedside  Status is: Inpatient  Remains inpatient appropriate because:Inpatient level of care appropriate due to severity of illness  Dispo: The patient is from: Home              Anticipated d/c is to: SNF              Patient currently is not medically stable to d/c.   Difficult to place patient No       Consultants:  Neurology Cardiology  Procedures:    Antimicrobials: Anti-infectives (From admission, onward)    Start     Dose/Rate Route Frequency Ordered Stop   07/16/21 0215  cefTRIAXone (ROCEPHIN) 1 g in sodium chloride 0.9 % 100 mL IVPB        1 g 200 mL/hr over 30 Minutes Intravenous Every 24 hours 07/16/21 0118     07/16/21 0215  azithromycin (ZITHROMAX) 500 mg in sodium chloride 0.9 % 250 mL IVPB        500 mg 250 mL/hr over 60 Minutes Intravenous Every 24 hours 07/16/21 0118         Subjective: Unable to assess given mentation  Objective: Vitals:   07/16/21 0721 07/16/21 1110 07/16/21 1527 07/16/21 1539  BP:  (!) 155/85 (!) 151/72 (!) 168/75  Pulse:  94 (!) 102 (!) 102  Resp: (!) 25 (!) 21 (!) 33 (!) 27  Temp:  99 F (37.2 C) (!) 101 F (38.3 C) 99.8 F (37.7 C)  TempSrc:  Oral Oral Axillary  SpO2:  100% 100% 100%  Weight:      Height:  Intake/Output Summary (Last 24 hours) at 07/16/2021 1639 Last data filed at 07/16/2021 1610 Gross per 24 hour  Intake 350 ml  Output 450 ml  Net -100 ml   Filed Weights   07/03/2021 1250  Weight: 55.2 kg    Examination: General exam: Conversant, in no acute distress Respiratory system: normal chest rise, clear, no audible wheezing Cardiovascular system: regular rhythm, s1-s2 Gastrointestinal system: Nondistended, nontender, pos BS Central nervous system: No seizures, no tremors Extremities: No cyanosis, no joint deformities Skin: No rashes, no pallor Psychiatry: Unable to assess given mentation  Data Reviewed: I have personally reviewed  following labs and imaging studies  CBC: Recent Labs  Lab 07/17/2021 1248 07/18/2021 1249 07/16/21 0259  WBC  --  14.2* 11.9*  NEUTROABS  --  13.4* 10.4*  HGB 13.3 12.3 12.5  HCT 39.0 39.5 38.7  MCV  --  93.2 89.6  PLT  --  381 386   Basic Metabolic Panel: Recent Labs  Lab 07/01/2021 1248 07/11/2021 1249 07/16/21 0259  NA 144 143 143  K 3.6 3.6 4.4  CL 112* 108 111  CO2  --  22 21*  GLUCOSE 162* 173* 136*  BUN 29* 26* 31*  CREATININE 1.10* 1.32* 1.40*  CALCIUM  --  9.4 9.3   GFR: Estimated Creatinine Clearance: 21.9 mL/min (A) (by C-G formula based on SCr of 1.4 mg/dL (H)). Liver Function Tests: Recent Labs  Lab 07/11/2021 1249  AST 55*  ALT 26  ALKPHOS 47  BILITOT 0.3  PROT 8.5*  ALBUMIN 3.2*   No results for input(s): LIPASE, AMYLASE in the last 168 hours. No results for input(s): AMMONIA in the last 168 hours. Coagulation Profile: Recent Labs  Lab 07/17/2021 1249  INR 1.1   Cardiac Enzymes: Recent Labs  Lab 07/02/2021 2059 07/16/21 0259  CKTOTAL 1,012* 1,118*   BNP (last 3 results) No results for input(s): PROBNP in the last 8760 hours. HbA1C: Recent Labs    07/30/2021 1249 07/16/21 0259  HGBA1C 6.2* 6.2*   CBG: Recent Labs  Lab 07/23/2021 1239  GLUCAP 153*   Lipid Profile: Recent Labs    07/01/2021 1249 07/16/21 0259  CHOL 158 164  HDL 61 64  LDLCALC 82 87  TRIG 73 67  CHOLHDL 2.6 2.6   Thyroid Function Tests: No results for input(s): TSH, T4TOTAL, FREET4, T3FREE, THYROIDAB in the last 72 hours. Anemia Panel: No results for input(s): VITAMINB12, FOLATE, FERRITIN, TIBC, IRON, RETICCTPCT in the last 72 hours. Sepsis Labs: No results for input(s): PROCALCITON, LATICACIDVEN in the last 168 hours.  Recent Results (from the past 240 hour(s))  SARS CORONAVIRUS 2 (TAT 6-24 HRS) Nasopharyngeal Nasopharyngeal Swab     Status: None   Collection Time: 07/03/2021  2:03 PM   Specimen: Nasopharyngeal Swab  Result Value Ref Range Status   SARS Coronavirus  2 NEGATIVE NEGATIVE Final    Comment: (NOTE) SARS-CoV-2 target nucleic acids are NOT DETECTED.  The SARS-CoV-2 RNA is generally detectable in upper and lower respiratory specimens during the acute phase of infection. Negative results do not preclude SARS-CoV-2 infection, do not rule out co-infections with other pathogens, and should not be used as the sole basis for treatment or other patient management decisions. Negative results must be combined with clinical observations, patient history, and epidemiological information. The expected result is Negative.  Fact Sheet for Patients: HairSlick.no  Fact Sheet for Healthcare Providers: quierodirigir.com  This test is not yet approved or cleared by the [place] FDA and  has been authorized for detection and/or diagnosis of SARS-CoV-2 by FDA under an Emergency Use Authorization (EUA). This EUA will remain  in effect (meaning this test can be used) for the duration of the COVID-19 declaration under Se ction 564(b)(1) of the Act, 21 U.S.C. section 360bbb-3(b)(1), unless the authorization is terminated or revoked sooner.  Performed at [facility], [address] [place], [place] [address]      Radiology Studies: MR ANGIO HEAD WO CONTRAST  Result Date: 07/14/2021 CLINICAL DATA:  Initial evaluation for acute stroke. EXAM: MRI HEAD WITHOUT CONTRAST MRA HEAD WITHOUT CONTRAST MRA NECK WITHOUT CONTRAST TECHNIQUE: Multiplanar, multiecho pulse sequences of the brain and surrounding structures were obtained without intravenous contrast. Angiographic images of the Circle of Willis were obtained using MRA technique without intravenous contrast. Angiographic images of the neck were obtained using MRA technique without intravenous contrast. Carotid stenosis measurements (when applicable) are obtained utilizing NASCET criteria, using the distal internal carotid diameter as the denominator.  COMPARISON:  Prior CT from earlier the same day. FINDINGS: MRI HEAD FINDINGS Brain: Examination degraded by motion artifact. Generalized age-related cerebral volume loss with mild chronic small vessel ischemic disease. Large confluent area of restricted diffusion involving the majority of the right MCA distribution is seen, consistent with a large evolving right MCA territory infarct. Involvement of the right caudate and lentiform nuclei noted. Associated gyral swelling and edema without significant regional mass or midline shift at this time. No associated hemorrhage visible on this motion degraded exam. No other evidence for acute or subacute ischemia. Gray-white matter differentiation otherwise maintained. No encephalomalacia to suggest chronic cortical infarction elsewhere within the brain. No other evidence for acute or chronic intracranial hemorrhage. No mass lesion or midline shift. No hydrocephalus or extra-axial fluid collection. Pituitary gland suprasellar region within normal limits. Midline structures intact. Vascular: Diminished flow void seen within the right MCA distribution. Major intracranial vascular flow voids are otherwise maintained. Skull and upper cervical spine: Craniocervical junction within normal limits. Reversal of the normal cervical lordosis with associated mild diffuse spondylosis noted within the upper cervical spine. Bone marrow signal intensity normal. No scalp soft tissue abnormality. Sinuses/Orbits: Right gaze noted. Globes and orbital soft tissues demonstrate no other acute finding. Paranasal sinuses are largely clear. Trace bilateral mastoid effusions noted, of doubtful significance. Other: None. MRA HEAD FINDINGS ANTERIOR CIRCULATION: Examination degraded by motion. Visualized distal cervical segments of the internal carotid arteries are patent with antegrade flow. Petrous, cavernous, and supraclinoid segments patent without stenosis. A1 segments patent bilaterally. Normal  anterior communicating artery complex. Anterior cerebral arteries patent to their distal aspects without stenosis. Left M1 patent. Normal left MCA bifurcation. Distal left MCA branches well perfused. There is acute occlusion of the proximal right M1 segment. Minimal scant flow within right MCA branches distally. POSTERIOR CIRCULATION: Both vertebral arteries patent to the vertebrobasilar junction without stenosis. Both PICA are patent. Basilar patent to its distal aspect without stenosis. Superior cerebral arteries patent bilaterally. Left PCA supplied via the basilar. Fetal type origin of the right PCA. Both PCAs grossly perfused to their distal aspects without visible stenosis. No visible aneurysm. MRA NECK FINDINGS AORTIC ARCH: Examination technically limited by motion and lack of IV contrast Partially visualized aortic arch within normal limits for caliber with normal branch pattern. No visible stenosis about the origin of the great vessels. RIGHT CAROTID SYSTEM: Right CCA poorly assessed due to motion, but is grossly patent to the bifurcation. No obvious significant stenosis about the right bifurcation.  Right ICA grossly patent distally to the skull base without visible stenosis or occlusion. LEFT CAROTID SYSTEM: Left CCA poorly visualized, but is grossly patent to the bifurcation. No obvious stenosis about the left bifurcation. Left ICA grossly patent to the skull base without visible stenosis or occlusion. VERTEBRAL ARTERIES: Both vertebral arteries appear to arise from the subclavian arteries. Vertebral arteries poorly assessed proximally, but are grossly patent to the skull base without visible stenosis or occlusion. IMPRESSION: MRI HEAD IMPRESSION: 1. Large evolving acute ischemic right MCA territory infarct. No associated hemorrhage or significant regional mass effect at this time. 2. Underlying mild age-related cerebral volume loss with chronic small vessel ischemic disease. MRA HEAD IMPRESSION: 1.  Motion degraded exam. 2. Acute occlusion of the proximal right M1 segment. 3. No other large vessel occlusion. No other visible hemodynamically significant or correctable stenosis. MRA NECK IMPRESSION: 1. Technically limited exam due to motion and lack of IV contrast. 2. Gross patency of both carotid artery systems and vertebral arteries to the skull base. No visible stenosis or other vascular abnormality. Electronically Signed   By: Rise Mu M.D.   On: 07/29/2021 20:47   MR ANGIO NECK WO CONTRAST  Result Date: 07/19/2021 CLINICAL DATA:  Initial evaluation for acute stroke. EXAM: MRI HEAD WITHOUT CONTRAST MRA HEAD WITHOUT CONTRAST MRA NECK WITHOUT CONTRAST TECHNIQUE: Multiplanar, multiecho pulse sequences of the brain and surrounding structures were obtained without intravenous contrast. Angiographic images of the Circle of Willis were obtained using MRA technique without intravenous contrast. Angiographic images of the neck were obtained using MRA technique without intravenous contrast. Carotid stenosis measurements (when applicable) are obtained utilizing NASCET criteria, using the distal internal carotid diameter as the denominator. COMPARISON:  Prior CT from earlier the same day. FINDINGS: MRI HEAD FINDINGS Brain: Examination degraded by motion artifact. Generalized age-related cerebral volume loss with mild chronic small vessel ischemic disease. Large confluent area of restricted diffusion involving the majority of the right MCA distribution is seen, consistent with a large evolving right MCA territory infarct. Involvement of the right caudate and lentiform nuclei noted. Associated gyral swelling and edema without significant regional mass or midline shift at this time. No associated hemorrhage visible on this motion degraded exam. No other evidence for acute or subacute ischemia. Gray-white matter differentiation otherwise maintained. No encephalomalacia to suggest chronic cortical infarction  elsewhere within the brain. No other evidence for acute or chronic intracranial hemorrhage. No mass lesion or midline shift. No hydrocephalus or extra-axial fluid collection. Pituitary gland suprasellar region within normal limits. Midline structures intact. Vascular: Diminished flow void seen within the right MCA distribution. Major intracranial vascular flow voids are otherwise maintained. Skull and upper cervical spine: Craniocervical junction within normal limits. Reversal of the normal cervical lordosis with associated mild diffuse spondylosis noted within the upper cervical spine. Bone marrow signal intensity normal. No scalp soft tissue abnormality. Sinuses/Orbits: Right gaze noted. Globes and orbital soft tissues demonstrate no other acute finding. Paranasal sinuses are largely clear. Trace bilateral mastoid effusions noted, of doubtful significance. Other: None. MRA HEAD FINDINGS ANTERIOR CIRCULATION: Examination degraded by motion. Visualized distal cervical segments of the internal carotid arteries are patent with antegrade flow. Petrous, cavernous, and supraclinoid segments patent without stenosis. A1 segments patent bilaterally. Normal anterior communicating artery complex. Anterior cerebral arteries patent to their distal aspects without stenosis. Left M1 patent. Normal left MCA bifurcation. Distal left MCA branches well perfused. There is acute occlusion of the proximal right M1 segment. Minimal scant flow within right MCA  branches distally. POSTERIOR CIRCULATION: Both vertebral arteries patent to the vertebrobasilar junction without stenosis. Both PICA are patent. Basilar patent to its distal aspect without stenosis. Superior cerebral arteries patent bilaterally. Left PCA supplied via the basilar. Fetal type origin of the right PCA. Both PCAs grossly perfused to their distal aspects without visible stenosis. No visible aneurysm. MRA NECK FINDINGS AORTIC ARCH: Examination technically limited by motion  and lack of IV contrast Partially visualized aortic arch within normal limits for caliber with normal branch pattern. No visible stenosis about the origin of the great vessels. RIGHT CAROTID SYSTEM: Right CCA poorly assessed due to motion, but is grossly patent to the bifurcation. No obvious significant stenosis about the right bifurcation. Right ICA grossly patent distally to the skull base without visible stenosis or occlusion. LEFT CAROTID SYSTEM: Left CCA poorly visualized, but is grossly patent to the bifurcation. No obvious stenosis about the left bifurcation. Left ICA grossly patent to the skull base without visible stenosis or occlusion. VERTEBRAL ARTERIES: Both vertebral arteries appear to arise from the subclavian arteries. Vertebral arteries poorly assessed proximally, but are grossly patent to the skull base without visible stenosis or occlusion. IMPRESSION: MRI HEAD IMPRESSION: 1. Large evolving acute ischemic right MCA territory infarct. No associated hemorrhage or significant regional mass effect at this time. 2. Underlying mild age-related cerebral volume loss with chronic small vessel ischemic disease. MRA HEAD IMPRESSION: 1. Motion degraded exam. 2. Acute occlusion of the proximal right M1 segment. 3. No other large vessel occlusion. No other visible hemodynamically significant or correctable stenosis. MRA NECK IMPRESSION: 1. Technically limited exam due to motion and lack of IV contrast. 2. Gross patency of both carotid artery systems and vertebral arteries to the skull base. No visible stenosis or other vascular abnormality. Electronically Signed   By: Rise Mu M.D.   On: 07/29/2021 20:47   MR BRAIN WO CONTRAST  Result Date: 07/23/2021 CLINICAL DATA:  Initial evaluation for acute stroke. EXAM: MRI HEAD WITHOUT CONTRAST MRA HEAD WITHOUT CONTRAST MRA NECK WITHOUT CONTRAST TECHNIQUE: Multiplanar, multiecho pulse sequences of the brain and surrounding structures were obtained without  intravenous contrast. Angiographic images of the Circle of Willis were obtained using MRA technique without intravenous contrast. Angiographic images of the neck were obtained using MRA technique without intravenous contrast. Carotid stenosis measurements (when applicable) are obtained utilizing NASCET criteria, using the distal internal carotid diameter as the denominator. COMPARISON:  Prior CT from earlier the same day. FINDINGS: MRI HEAD FINDINGS Brain: Examination degraded by motion artifact. Generalized age-related cerebral volume loss with mild chronic small vessel ischemic disease. Large confluent area of restricted diffusion involving the majority of the right MCA distribution is seen, consistent with a large evolving right MCA territory infarct. Involvement of the right caudate and lentiform nuclei noted. Associated gyral swelling and edema without significant regional mass or midline shift at this time. No associated hemorrhage visible on this motion degraded exam. No other evidence for acute or subacute ischemia. Gray-white matter differentiation otherwise maintained. No encephalomalacia to suggest chronic cortical infarction elsewhere within the brain. No other evidence for acute or chronic intracranial hemorrhage. No mass lesion or midline shift. No hydrocephalus or extra-axial fluid collection. Pituitary gland suprasellar region within normal limits. Midline structures intact. Vascular: Diminished flow void seen within the right MCA distribution. Major intracranial vascular flow voids are otherwise maintained. Skull and upper cervical spine: Craniocervical junction within normal limits. Reversal of the normal cervical lordosis with associated mild diffuse spondylosis noted within the upper  cervical spine. Bone marrow signal intensity normal. No scalp soft tissue abnormality. Sinuses/Orbits: Right gaze noted. Globes and orbital soft tissues demonstrate no other acute finding. Paranasal sinuses are  largely clear. Trace bilateral mastoid effusions noted, of doubtful significance. Other: None. MRA HEAD FINDINGS ANTERIOR CIRCULATION: Examination degraded by motion. Visualized distal cervical segments of the internal carotid arteries are patent with antegrade flow. Petrous, cavernous, and supraclinoid segments patent without stenosis. A1 segments patent bilaterally. Normal anterior communicating artery complex. Anterior cerebral arteries patent to their distal aspects without stenosis. Left M1 patent. Normal left MCA bifurcation. Distal left MCA branches well perfused. There is acute occlusion of the proximal right M1 segment. Minimal scant flow within right MCA branches distally. POSTERIOR CIRCULATION: Both vertebral arteries patent to the vertebrobasilar junction without stenosis. Both PICA are patent. Basilar patent to its distal aspect without stenosis. Superior cerebral arteries patent bilaterally. Left PCA supplied via the basilar. Fetal type origin of the right PCA. Both PCAs grossly perfused to their distal aspects without visible stenosis. No visible aneurysm. MRA NECK FINDINGS AORTIC ARCH: Examination technically limited by motion and lack of IV contrast Partially visualized aortic arch within normal limits for caliber with normal branch pattern. No visible stenosis about the origin of the great vessels. RIGHT CAROTID SYSTEM: Right CCA poorly assessed due to motion, but is grossly patent to the bifurcation. No obvious significant stenosis about the right bifurcation. Right ICA grossly patent distally to the skull base without visible stenosis or occlusion. LEFT CAROTID SYSTEM: Left CCA poorly visualized, but is grossly patent to the bifurcation. No obvious stenosis about the left bifurcation. Left ICA grossly patent to the skull base without visible stenosis or occlusion. VERTEBRAL ARTERIES: Both vertebral arteries appear to arise from the subclavian arteries. Vertebral arteries poorly assessed  proximally, but are grossly patent to the skull base without visible stenosis or occlusion. IMPRESSION: MRI HEAD IMPRESSION: 1. Large evolving acute ischemic right MCA territory infarct. No associated hemorrhage or significant regional mass effect at this time. 2. Underlying mild age-related cerebral volume loss with chronic small vessel ischemic disease. MRA HEAD IMPRESSION: 1. Motion degraded exam. 2. Acute occlusion of the proximal right M1 segment. 3. No other large vessel occlusion. No other visible hemodynamically significant or correctable stenosis. MRA NECK IMPRESSION: 1. Technically limited exam due to motion and lack of IV contrast. 2. Gross patency of both carotid artery systems and vertebral arteries to the skull base. No visible stenosis or other vascular abnormality. Electronically Signed   By: Rise MuBenjamin  McClintock M.D.   On: 07/14/2021 20:47   DG CHEST PORT 1 VIEW  Result Date: 07/16/2021 CLINICAL DATA:  87 year old female with history of shortness of breath. Evaluate for pneumonia. EXAM: PORTABLE CHEST 1 VIEW COMPARISON:  Chest x-ray 07/16/2021 FINDINGS: Patchy ill-defined opacities and widespread areas of interstitial prominence are again noted in the lungs bilaterally, most severe in the left upper lobe. Aeration appears slightly improved compared to the recent prior study. Chronic elevation of the left hemidiaphragm. No pneumothorax. No evidence of pulmonary edema. Moderate cardiomegaly. The patient is rotated to the right on today's exam, resulting in distortion of the mediastinal contours and reduced diagnostic sensitivity and specificity for mediastinal pathology. Atherosclerotic calcifications in the thoracic aorta. IMPRESSION: 1. Improving aeration with what appears to be resolving multilobar bilateral pneumonia. 2. Moderate cardiomegaly. 3. Aortic atherosclerosis. Electronically Signed   By: Trudie Reedaniel  Entrikin M.D.   On: 07/16/2021 10:17   DG Chest Port 1 View  Result Date:  07/16/2021  CLINICAL DATA:  Hypoxia. New onset aphasia and left-sided paralysis. EXAM: PORTABLE CHEST 1 VIEW COMPARISON:  One-view chest x-ray 05/06/2020 FINDINGS: Heart is enlarged. Atherosclerotic changes are noted within the aorta. Asymmetric interstitial and airspace disease is present throughout the left lung. There is some increased opacity at the right base. Chronic elevation of left hemidiaphragm again noted. IMPRESSION: 1. Stable cardiomegaly. 2. Asymmetric interstitial and airspace disease throughout the left lung concerning for pneumonia. This may represent asymmetric edema. 3. Increased opacity at the right base likely represents atelectasis. Electronically Signed   By: Marin Roberts M.D.   On: 08/07/21 14:50   ECHOCARDIOGRAM COMPLETE  Result Date: 07/16/2021    ECHOCARDIOGRAM REPORT   Patient Name:   Latasha Norton Date of Exam: 07/16/2021 Medical Rec #:  [id number]      Height:       66.0 in Accession #:    [id number]     Weight:       121.7 lb Date of Birth:  30-Jun-1925      BSA:          1.619 m Patient Age:    89 years       BP:           155/85 mmHg Patient Gender: F              HR:           102 bpm. Exam Location:  Inpatient Procedure: 2D Echo Indications:     stroke  History:         Patient has prior history of Echocardiogram examinations, most                  recent 04/07/2020. Sick sinus syndrome, chronic kidney disease;                  Risk Factors:Hypertension.  Sonographer:     Delcie Roch Referring Phys:  [id number] Kara Mead Diagnosing Phys: Orpah Cobb MD  Sonographer Comments: Image acquisition challenging due to uncooperative patient and Image acquisition challenging due to respiratory motion. IMPRESSIONS  1. Left ventricular ejection fraction, by estimation, is 25 to 30%. The left ventricle has severely decreased function. The left ventricle demonstrates global hypokinesis. There is mild concentric left ventricular hypertrophy. Left ventricular diastolic   parameters are consistent with Grade I diastolic dysfunction (impaired relaxation). There is severe hypokinesis of the left ventricular, entire septal wall and inferior wall.  2. Right ventricular systolic function is normal. The right ventricular size is normal. There is mildly elevated pulmonary artery systolic pressure.  3. Left atrial size was severely dilated.  4. Right atrial size was mildly dilated.  5. The mitral valve is degenerative. Moderate to severe mitral valve regurgitation. Moderate mitral annular calcification.  6. The aortic valve is tricuspid. There is mild calcification of the aortic valve. There is mild thickening of the aortic valve. Aortic valve regurgitation is moderate.  7. There is mild (Grade II) atheroma plaque involving the aortic root and ascending aorta.  8. The inferior vena cava is normal in size with <50% respiratory variability, suggesting right atrial pressure of 8 mmHg. FINDINGS  Left Ventricle: Left ventricular ejection fraction, by estimation, is 25 to 30%. The left ventricle has severely decreased function. The left ventricle demonstrates global hypokinesis. Severe hypokinesis of the left ventricular, entire septal wall and inferior wall. The left ventricular internal cavity size was normal in size. There is mild concentric left ventricular hypertrophy. Left ventricular diastolic  parameters are consistent with Grade I diastolic dysfunction (impaired relaxation). Right Ventricle: The right ventricular size is normal. No increase in right ventricular wall thickness. Right ventricular systolic function is normal. There is mildly elevated pulmonary artery systolic pressure. The tricuspid regurgitant velocity is 2.96  m/s, and with an assumed right atrial pressure of 3 mmHg, the estimated right ventricular systolic pressure is 38.0 mmHg. Left Atrium: Left atrial size was severely dilated. Right Atrium: Right atrial size was mildly dilated. Pericardium: There is no evidence of  pericardial effusion. Mitral Valve: The mitral valve is degenerative in appearance. There is moderate thickening of the mitral valve leaflet(s). There is moderate calcification of the mitral valve leaflet(s). Moderate mitral annular calcification. Moderate to severe mitral valve regurgitation. Tricuspid Valve: The tricuspid valve is normal in structure. Tricuspid valve regurgitation is mild. Aortic Valve: The aortic valve is tricuspid. There is mild calcification of the aortic valve. There is mild thickening of the aortic valve. There is mild aortic valve annular calcification. Aortic valve regurgitation is moderate. Aortic regurgitation PHT  measures 283 msec. Pulmonic Valve: The pulmonic valve was normal in structure. Pulmonic valve regurgitation is not visualized. Aorta: The aortic root is normal in size and structure. There is mild (Grade II) atheroma plaque involving the aortic root and ascending aorta. Venous: The inferior vena cava is normal in size with less than 50% respiratory variability, suggesting right atrial pressure of 8 mmHg. IAS/Shunts: The atrial septum is grossly normal.  LEFT VENTRICLE PLAX 2D LVIDd:         4.70 cm LVIDs:         4.20 cm LV PW:         1.10 cm LV IVS:        1.20 cm LVOT diam:     1.80 cm LV SV:         29 LV SV Index:   18 LVOT Area:     2.54 cm  RIGHT VENTRICLE             IVC RV S prime:     11.10 cm/s  IVC diam: 1.50 cm TAPSE (M-mode): 1.8 cm LEFT ATRIUM             Index       RIGHT ATRIUM           Index LA diam:        4.50 cm 2.78 cm/m  RA Area:     12.80 cm LA Vol (A2C):   85.8 ml 52.99 ml/m RA Volume:   25.50 ml  15.75 ml/m LA Vol (A4C):   61.9 ml 38.23 ml/m LA Biplane Vol: 72.7 ml 44.90 ml/m  AORTIC VALVE LVOT Vmax:   90.00 cm/s LVOT Vmean:  54.500 cm/s LVOT VTI:    0.112 m AI PHT:      283 msec  AORTA Ao Root diam: 2.80 cm MR Peak grad: 106.1 mmHg  TRICUSPID VALVE MR Mean grad: 56.0 mmHg   TR Peak grad:   35.0 mmHg MR Vmax:      515.00 cm/s TR Vmax:         296.00 cm/s MR Vmean:     344.0 cm/s                           SHUNTS  Systemic VTI:  0.11 m                           Systemic Diam: 1.80 cm Orpah Cobb MD Electronically signed by Orpah Cobb MD Signature Date/Time: 07/16/2021/3:43:21 PM    Final    CT HEAD CODE STROKE WO CONTRAST  Result Date: 07/13/2021 CLINICAL DATA:  Code stroke.  Acute neuro deficit. EXAM: CT HEAD WITHOUT CONTRAST TECHNIQUE: Contiguous axial images were obtained from the base of the skull through the vertex without intravenous contrast. COMPARISON:  CT head 05/06/2020 FINDINGS: Brain: Large territory cytotoxic edema right MCA territory. Edema is present in the right frontal and parietal lobe extending into the insula and right caudate nucleus. Involvement of the anterior limb internal capsule. Ventricle size normal.  No midline shift.  No acute hemorrhage. Vascular: Atherosclerotic calcification in the cavernous carotid bilaterally. No hyperdense vessel in the M1 segment. There are hyperdense vessels in the right sylvian fissure which may be due to thrombosed MCA branches. Skull: Negative Sinuses/Orbits: Paranasal sinuses clear. Bilateral cataract extraction Other: None ASPECTS ([place]berta Stroke Program Early CT Score) - Ganglionic level infarction (caudate, lentiform nuclei, internal capsule, insula, M1-M3 cortex): 1 - Supraganglionic infarction (M4-M6 cortex): 0 Total score (0-10 with 10 being normal): 1 IMPRESSION: 1. Large territory acute infarct right MCA territory. No hemorrhage or midline shift. 2. ASPECTS is 1 3. Code stroke imaging results were communicated on 07/10/2021 at 1:04 pm to provider Bhagat via text page Electronically Signed   By: Marlan Palau M.D.   On: 07/24/2021 13:07   VAS US CAROTID  Result Date: 07/16/2021 Carotid Arterial Duplex Study Patient Name:  Latasha Norton  Date of Exam:   07/16/2021 Medical Rec #: [id number]       Accession #:    [id number] Date of Birth: 1925-01-21        Patient Gender: F Patient Age:   89Y Exam Location:  [facility] Procedure:      VAS US CAROTID Referring Phys: [id number] Garner Gavel --------------------------------------------------------------------------------  Indications:       CVA. Limitations        Today's exam was limited due to Patient somnolence, inability                    to reposition, anatomy/tortuosity of vessels. Comparison Study:  07/03/2021 MRA Neck suboptimal visualization of bilateral                    carotids. Performing Technologist: Jean Rosenthal RDMS,RVT  Examination Guidelines: A complete evaluation includes B-mode imaging, spectral Doppler, color Doppler, and power Doppler as needed of all accessible portions of each vessel. Bilateral testing is considered an integral part of a complete examination. Limited examinations for reoccurring indications may be performed as noted.  Right Carotid Findings: +----------+--------+--------+--------+------------------+--------+           PSV cm/sEDV cm/sStenosisPlaque DescriptionComments +----------+--------+--------+--------+------------------+--------+ CCA Prox  45      10                                tortuous +----------+--------+--------+--------+------------------+--------+ CCA Mid                                             tortuous +----------+--------+--------+--------+------------------+--------+ CCA Distal55  8                                          +----------+--------+--------+--------+------------------+--------+ ICA Prox  58      15      1-39%   heterogenous      tortuous +----------+--------+--------+--------+------------------+--------+ ICA Distal31      9                                          +----------+--------+--------+--------+------------------+--------+ ECA       73                                                 +----------+--------+--------+--------+------------------+--------+  +----------+--------+-------+----------------+-------------------+           PSV cm/sEDV cmsDescribe        Arm Pressure (mmHG) +----------+--------+-------+----------------+-------------------+ [id number]             Multiphasic, WNL                    +----------+--------+-------+----------------+-------------------+ +---------+--------+--+--------+-+---------+ VertebralPSV cm/s30EDV cm/s7Antegrade +---------+--------+--+--------+-+---------+  Left Carotid Findings: +----------+--------+--------+--------+------------------+--------+           PSV cm/sEDV cm/sStenosisPlaque DescriptionComments +----------+--------+--------+--------+------------------+--------+ CCA Prox  93      11                                         +----------+--------+--------+--------+------------------+--------+ CCA Distal76      13                                         +----------+--------+--------+--------+------------------+--------+ ICA Prox  59      17      1-39%   heterogenous               +----------+--------+--------+--------+------------------+--------+ ICA Distal47      13                                         +----------+--------+--------+--------+------------------+--------+ ECA       59                                                 +----------+--------+--------+--------+------------------+--------+ +----------+--------+--------+----------------+-------------------+           PSV cm/sEDV cm/sDescribe        Arm Pressure (mmHG) +----------+--------+--------+----------------+-------------------+ [id number]             Multiphasic, WNL                    +----------+--------+--------+----------------+-------------------+ +---------+--------+--+--------+--+---------+ VertebralPSV cm/s59EDV cm/s14Antegrade +---------+--------+--+--------+--+---------+   Summary: Right Carotid: Velocities in the right ICA are consistent with a 1-39% stenosis.  Left Carotid: Velocities in the left ICA are consistent with a 1-39% stenosis. Vertebrals:  Bilateral  vertebral arteries demonstrate antegrade flow. Subclavians: Normal flow hemodynamics were seen in bilateral subclavian              arteries. *See table(s) above for measurements and observations.     Preliminary     Scheduled Meds:   stroke: mapping our early stages of recovery book   Does not apply Once   aspirin  150 mg Rectal Daily   carvedilol  3.125 mg Oral BID WC   furosemide  20 mg Intravenous Daily   [START ON 07/19/2021] levothyroxine  75 mcg Intravenous Daily   sodium chloride flush  3 mL Intravenous Once   Continuous Infusions:  sodium chloride 50 mL/hr at 07/16/21 1304   azithromycin Stopped (07/16/21 0327)   cefTRIAXone (ROCEPHIN)  IV Stopped (07/16/21 0239)     LOS: 1 day   Rickey Barbara, MD [facility] Pager On Amion  If 7PM-7AM, please contact night-coverage 07/16/2021, 4:39 PM

## 2021-07-16 NOTE — Consult Note (Signed)
Referring Physician: Rickey Barbara, MD  Latasha Norton is an 87 y.o. female.                       Chief Complaint: Stroke and NSTEMI  HPI: 87 years old black female with PMH of chronic systolic heart failure, LVEF 25-30 % today, CKD stage III, HTN, hypothyroidism presented with new left sided weakness and speech disturbance. CT head positive for large right sided MCA acute infarct. EKG shows SR, 1st degree AV block and LBBB.  Past Medical History:  Diagnosis Date   CHF (congestive heart failure) (HCC)    Hypertension    SSS (sick sinus syndrome) (HCC)    Thyroid disease       History reviewed. No pertinent surgical history.  No family history on file. Social History:  reports that she has never smoked. She has never used smokeless tobacco. She reports previous alcohol use. She reports current drug use.  Allergies: No Known Allergies  Medications Prior to Admission  Medication Sig Dispense Refill   acetaminophen (TYLENOL) 500 MG tablet Take 500 mg by mouth every 6 (six) hours as needed for moderate pain (arthritis).     amLODipine (NORVASC) 5 MG tablet Take 1 tablet (5 mg total) by mouth daily. For HTN 30 tablet 0   Ascorbic Acid (VITAMIN C) 1000 MG tablet Take 500 mg by mouth daily.     aspirin EC 81 MG tablet Take 81 mg by mouth daily. Swallow whole.     cholecalciferol (VITAMIN D) 25 MCG (1000 UNIT) tablet Take 1,000 Units by mouth daily.     furosemide (LASIX) 20 MG tablet Take 1 tablet (20 mg total) by mouth daily. (Patient taking differently: Take 20 mg by mouth 3 (three) times a week.) 30 tablet 0   hydrALAZINE (APRESOLINE) 25 MG tablet Take 25 mg by mouth 3 (three) times daily.     hydrochlorothiazide (MICROZIDE) 12.5 MG capsule Take 12.5 mg by mouth daily.     levothyroxine (SYNTHROID) 150 MCG tablet Take 1 tablet (150 mcg total) by mouth daily. 30 tablet 0   Multiple Vitamin (MULTIVITAMIN) capsule Take 1 capsule by mouth daily.     Vitamin A 2400 MCG (8000 UT) TABS Take 1  tablet by mouth daily.     vitamin E 180 MG (400 UNITS) capsule Take 400 Units by mouth daily.     acetaminophen (TYLENOL) 325 MG tablet Take 2 tablets (650 mg total) by mouth at bedtime as needed (insomnia). (Patient not taking: Reported on 06/30/2021) 30 tablet 1   aspirin 81 MG chewable tablet Chew 1 tablet (81 mg total) by mouth daily. (Patient not taking: Reported on 07/28/2021) 30 tablet 0   diclofenac Sodium (VOLTAREN) 1 % GEL Apply 1 application topically 3 (three) times daily as needed. Apply to affected joint (Patient not taking: Reported on 07/24/2021) 100 g 0   fluticasone (FLONASE) 50 MCG/ACT nasal spray Place 1 spray into both nostrils daily as needed. (Patient not taking: Reported on 07/14/2021) 16 g 0   zolpidem (AMBIEN) 5 MG tablet Take 0.5 tablets (2.5 mg total) by mouth at bedtime as needed for sleep. (Patient not taking: Reported on 07/14/2021) 5 tablet 0    Results for orders placed or performed during the hospital encounter of 07/07/2021 (from the past 48 hour(s))  CBG monitoring, ED     Status: Abnormal   Collection Time: 07/05/2021 12:39 PM  Result Value Ref Range   Glucose-Capillary 153 (H) 70 -  99 mg/dL    Comment: Glucose reference range applies only to samples taken after fasting for at least 8 hours.  I-stat chem 8, ED     Status: Abnormal   Collection Time: 07/22/2021 12:48 PM  Result Value Ref Range   Sodium 144 135 - 145 mmol/L   Potassium 3.6 3.5 - 5.1 mmol/L   Chloride 112 (H) 98 - 111 mmol/L   BUN 29 (H) 8 - 23 mg/dL   Creatinine, Ser 2.22 (H) 0.44 - 1.00 mg/dL   Glucose, Bld 979 (H) 70 - 99 mg/dL    Comment: Glucose reference range applies only to samples taken after fasting for at least 8 hours.   Calcium, Ion 1.08 (L) 1.15 - 1.40 mmol/L   TCO2 21 (L) 22 - 32 mmol/L   Hemoglobin 13.3 12.0 - 15.0 g/dL   HCT 89.2 11.9 - 41.7 %  Protime-INR     Status: None   Collection Time: 07/23/2021 12:49 PM  Result Value Ref Range   Prothrombin Time 14.1 11.4 - 15.2 seconds    INR 1.1 0.8 - 1.2    Comment: (NOTE) INR goal varies based on device and disease states. Performed at [facility], [address], [place], [place] [address]   APTT     Status: None   Collection Time: 07/12/2021 12:49 PM  Result Value Ref Range   aPTT 28 24 - 36 seconds    Comment: Performed at [facility], [address] [place], [place] [address]  CBC     Status: Abnormal   Collection Time: 07/10/2021 12:49 PM  Result Value Ref Range   WBC 14.2 (H) 4.0 - 10.5 K/uL   RBC 4.24 3.87 - 5.11 MIL/uL   Hemoglobin 12.3 12.0 - 15.0 g/dL   HCT 63.1 49.7 - 02.6 %   MCV 93.2 80.0 - 100.0 fL   MCH 29.0 26.0 - 34.0 pg   MCHC 31.1 30.0 - 36.0 g/dL   RDW 37.8 58.8 - 50.2 %   Platelets 381 150 - 400 K/uL   nRBC 0.0 0.0 - 0.2 %    Comment: Performed at [facility], [address], [place], [place] [address]  Differential     Status: Abnormal   Collection Time: 07/16/2021 12:49 PM  Result Value Ref Range   Neutrophils Relative % 94 %   Neutro Abs 13.4 (H) 1.7 - 7.7 K/uL   Lymphocytes Relative 3 %   Lymphs Abs 0.4 (L) 0.7 - 4.0 K/uL   Monocytes Relative 3 %   Monocytes Absolute 0.4 0.1 - 1.0 K/uL   Eosinophils Relative 0 %   Eosinophils Absolute 0.0 0.0 - 0.5 K/uL   Basophils Relative 0 %   Basophils Absolute 0.0 0.0 - 0.1 K/uL   Immature Granulocytes 0 %   Abs Immature Granulocytes 0.03 0.00 - 0.07 K/uL    Comment: Performed at [facility], [address] [place], [place] [address]  Comprehensive metabolic panel     Status: Abnormal   Collection Time: 07/08/2021 12:49 PM  Result Value Ref Range   Sodium 143 135 - 145 mmol/L   Potassium 3.6 3.5 - 5.1 mmol/L   Chloride 108 98 - 111 mmol/L   CO2 22 22 - 32 mmol/L   Glucose, Bld 173 (H) 70 - 99 mg/dL    Comment: Glucose reference range applies only to samples taken after fasting for at least 8 hours.   BUN 26 (H) 8 -  23 mg/dL   Creatinine, Ser 9.52 (H) 0.44 - 1.00 mg/dL   Calcium 9.4 8.9 - 84.1 mg/dL   Total  Protein 8.5 (H) 6.5 - 8.1 g/dL   Albumin 3.2 (L) 3.5 - 5.0 g/dL   AST 55 (H) 15 - 41 U/L   ALT 26 0 - 44 U/L   Alkaline Phosphatase 47 38 - 126 U/L   Total Bilirubin 0.3 0.3 - 1.2 mg/dL   GFR, Estimated 38 (L) >60 mL/min    Comment: (NOTE) Calculated using the CKD-EPI Creatinine Equation (2021)    Anion gap 13 5 - 15    Comment: Performed at [facility], [address] [place], [place] [address]  Hemoglobin A1c     Status: Abnormal   Collection Time: 07/26/2021 12:49 PM  Result Value Ref Range   Hgb A1c MFr Bld 6.2 (H) 4.8 - 5.6 %    Comment: (NOTE) Pre diabetes:          5.7%-6.4%  Diabetes:              >6.4%  Glycemic control for   <7.0% adults with diabetes    Mean Plasma Glucose 131.24 mg/dL    Comment: Performed at [facility], [address] [place], [place] [address]  Lipid panel     Status: None   Collection Time: 07/02/2021 12:49 PM  Result Value Ref Range   Cholesterol 158 0 - 200 mg/dL   Triglycerides 73 <536 mg/dL   HDL 61 >64 mg/dL   Total CHOL/HDL Ratio 2.6 RATIO   VLDL 15 0 - 40 mg/dL   LDL Cholesterol 82 0 - 99 mg/dL    Comment:        Total Cholesterol/HDL:CHD Risk Coronary Heart Disease Risk Table                     Men   Women  1/2 Average Risk   3.4   3.3  Average Risk       5.0   4.4  2 X Average Risk   9.6   7.1  3 X Average Risk  23.4   11.0        Use the calculated Patient Ratio above and the CHD Risk Table to determine the patient's CHD Risk.        ATP III CLASSIFICATION (LDL):  <100     mg/dL   Optimal  403-474  mg/dL   Near or Above                    Optimal  130-159  mg/dL   Borderline  259-563  mg/dL   High  >875     mg/dL   Very High Performed at [facility], [address], [place], [place] [address]   SARS CORONAVIRUS 2 (TAT 6-24 HRS) Nasopharyngeal Nasopharyngeal Swab     Status: None   Collection Time: 07/14/2021  2:03 PM   Specimen: Nasopharyngeal Swab  Result Value Ref Range   SARS Coronavirus 2  NEGATIVE NEGATIVE    Comment: (NOTE) SARS-CoV-2 target nucleic acids are NOT DETECTED.  The SARS-CoV-2 RNA is generally detectable in upper and lower respiratory specimens during the acute phase of infection. Negative results do not preclude SARS-CoV-2 infection, do not rule out co-infections with other pathogens, and should not be used as the sole basis for treatment or other patient management decisions. Negative results must be combined with clinical observations, patient  history, and epidemiological information. The expected result is Negative.  Fact Sheet for Patients: HairSlick.no  Fact Sheet for Healthcare Providers: quierodirigir.com  This test is not yet approved or cleared by the [place] FDA and  has been authorized for detection and/or diagnosis of SARS-CoV-2 by FDA under an Emergency Use Authorization (EUA). This EUA will remain  in effect (meaning this test can be used) for the duration of the COVID-19 declaration under Se ction 564(b)(1) of the Act, 21 U.S.C. section 360bbb-3(b)(1), unless the authorization is terminated or revoked sooner.  Performed at [facility], [address] [place], [place] [address]   CK     Status: Abnormal   Collection Time: 08/12/2021  8:59 PM  Result Value Ref Range   Total CK 1,012 (H) 38 - 234 U/L    Comment: Performed at [facility], [address] [place], [place] [address]  Hemoglobin A1c     Status: Abnormal   Collection Time: 07/16/21  2:59 AM  Result Value Ref Range   Hgb A1c MFr Bld 6.2 (H) 4.8 - 5.6 %    Comment: (NOTE) Pre diabetes:          5.7%-6.4%  Diabetes:              >6.4%  Glycemic control for   <7.0% adults with diabetes    Mean Plasma Glucose 131.24 mg/dL    Comment: Performed at [facility], [address], [place], [place] [address]  Lipid panel     Status: None   Collection Time: 07/16/21  2:59 AM  Result Value Ref Range    Cholesterol 164 0 - 200 mg/dL   Triglycerides 67 <197 mg/dL   HDL 64 >58 mg/dL   Total CHOL/HDL Ratio 2.6 RATIO   VLDL 13 0 - 40 mg/dL   LDL Cholesterol 87 0 - 99 mg/dL    Comment:        Total Cholesterol/HDL:CHD Risk Coronary Heart Disease Risk Table                     Men   Women  1/2 Average Risk   3.4   3.3  Average Risk       5.0   4.4  2 X Average Risk   9.6   7.1  3 X Average Risk  23.4   11.0        Use the calculated Patient Ratio above and the CHD Risk Table to determine the patient's CHD Risk.        ATP III CLASSIFICATION (LDL):  <100     mg/dL   Optimal  832-549  mg/dL   Near or Above                    Optimal  130-159  mg/dL   Borderline  826-415  mg/dL   High  >830     mg/dL   Very High Performed at [facility], [address] [place], [place] [address]   CBC with Differential/Platelet     Status: Abnormal   Collection Time: 07/16/21  2:59 AM  Result Value Ref Range   WBC 11.9 (H) 4.0 - 10.5 K/uL   RBC 4.32 3.87 - 5.11 MIL/uL   Hemoglobin 12.5 12.0 - 15.0 g/dL   HCT 80.8 81.1 - 03.1 %   MCV 89.6 80.0 - 100.0 fL   MCH 28.9 26.0 - 34.0 pg  MCHC 32.3 30.0 - 36.0 g/dL   RDW 11.9 14.7 - 82.9 %   Platelets 386 150 - 400 K/uL   nRBC 0.0 0.0 - 0.2 %   Neutrophils Relative % 87 %   Neutro Abs 10.4 (H) 1.7 - 7.7 K/uL   Lymphocytes Relative 6 %   Lymphs Abs 0.7 0.7 - 4.0 K/uL   Monocytes Relative 7 %   Monocytes Absolute 0.8 0.1 - 1.0 K/uL   Eosinophils Relative 0 %   Eosinophils Absolute 0.0 0.0 - 0.5 K/uL   Basophils Relative 0 %   Basophils Absolute 0.0 0.0 - 0.1 K/uL   Immature Granulocytes 0 %   Abs Immature Granulocytes 0.04 0.00 - 0.07 K/uL    Comment: Performed at [facility], [address] [place], [place] [address]  Basic metabolic panel     Status: Abnormal   Collection Time: 07/16/21  2:59 AM  Result Value Ref Range   Sodium 143 135 - 145 mmol/L   Potassium 4.4 3.5 - 5.1 mmol/L   Chloride 111 98 - 111 mmol/L   CO2 21  (L) 22 - 32 mmol/L   Glucose, Bld 136 (H) 70 - 99 mg/dL    Comment: Glucose reference range applies only to samples taken after fasting for at least 8 hours.   BUN 31 (H) 8 - 23 mg/dL   Creatinine, Ser 0.86 (H) 0.44 - 1.00 mg/dL   Calcium 9.3 8.9 - 57.8 mg/dL   GFR, Estimated 35 (L) >60 mL/min    Comment: (NOTE) Calculated using the CKD-EPI Creatinine Equation (2021)    Anion gap 11 5 - 15    Comment: Performed at [facility], [address], [place], [place] [address]  CK     Status: Abnormal   Collection Time: 07/16/21  2:59 AM  Result Value Ref Range   Total CK 1,118 (H) 38 - 234 U/L    Comment: Performed at [facility], [address], [place], [place] [address]  Blood gas, venous     Status: Abnormal   Collection Time: 07/16/21  2:59 AM  Result Value Ref Range   pH, Ven 7.440 (H) 7.250 - 7.430   pCO2, Ven 30.6 (L) 44.0 - 60.0 mmHg   pO2, Ven 35.2 32.0 - 45.0 mmHg   Bicarbonate 20.6 20.0 - 28.0 mmol/L   Acid-base deficit 3.0 (H) 0.0 - 2.0 mmol/L   O2 Saturation 65.7 %   Patient temperature 36.5    Collection site VENOUS    Drawn by 1679    Sample type VENOUS     Comment: Performed at [facility], [address], [place], [place] [address]  Troponin I (High Sensitivity)     Status: Abnormal   Collection Time: 07/16/21  2:59 AM  Result Value Ref Range   Troponin I (High Sensitivity) 9,792 (HH) <18 ng/L    Comment: CRITICAL RESULT CALLED TO, READ BACK BY AND VERIFIED WITH: S.BLEDSOE RN @ [phone] 07/16/2021 BY C.EDENS (NOTE) Elevated high sensitivity troponin I (hsTnI) values and significant  changes across serial measurements may suggest ACS but many other  chronic and acute conditions are known to elevate hsTnI results.  Refer to the Links section for chest pain algorithms and additional  guidance. Performed at [facility], [address] [place], [place] [address]   Troponin I (High Sensitivity)     Status: Abnormal   Collection Time:  07/16/21  7:27 AM  Result  Value Ref Range   Troponin I (High Sensitivity) 9,432 (HH) <18 ng/L    Comment: CRITICAL VALUE NOTED.  VALUE IS CONSISTENT WITH PREVIOUSLY REPORTED AND CALLED VALUE. (NOTE) Elevated high sensitivity troponin I (hsTnI) values and significant  changes across serial measurements may suggest ACS but many other  chronic and acute conditions are known to elevate hsTnI results.  Refer to the Links section for chest pain algorithms and additional  guidance. Performed at [facility], [address], [place], [place] [address]    MR ANGIO HEAD WO CONTRAST  Result Date: 07/11/2021 CLINICAL DATA:  Initial evaluation for acute stroke. EXAM: MRI HEAD WITHOUT CONTRAST MRA HEAD WITHOUT CONTRAST MRA NECK WITHOUT CONTRAST TECHNIQUE: Multiplanar, multiecho pulse sequences of the brain and surrounding structures were obtained without intravenous contrast. Angiographic images of the Circle of Willis were obtained using MRA technique without intravenous contrast. Angiographic images of the neck were obtained using MRA technique without intravenous contrast. Carotid stenosis measurements (when applicable) are obtained utilizing NASCET criteria, using the distal internal carotid diameter as the denominator. COMPARISON:  Prior CT from earlier the same day. FINDINGS: MRI HEAD FINDINGS Brain: Examination degraded by motion artifact. Generalized age-related cerebral volume loss with mild chronic small vessel ischemic disease. Large confluent area of restricted diffusion involving the majority of the right MCA distribution is seen, consistent with a large evolving right MCA territory infarct. Involvement of the right caudate and lentiform nuclei noted. Associated gyral swelling and edema without significant regional mass or midline shift at this time. No associated hemorrhage visible on this motion degraded exam. No other evidence for acute or subacute ischemia. Gray-white matter differentiation  otherwise maintained. No encephalomalacia to suggest chronic cortical infarction elsewhere within the brain. No other evidence for acute or chronic intracranial hemorrhage. No mass lesion or midline shift. No hydrocephalus or extra-axial fluid collection. Pituitary gland suprasellar region within normal limits. Midline structures intact. Vascular: Diminished flow void seen within the right MCA distribution. Major intracranial vascular flow voids are otherwise maintained. Skull and upper cervical spine: Craniocervical junction within normal limits. Reversal of the normal cervical lordosis with associated mild diffuse spondylosis noted within the upper cervical spine. Bone marrow signal intensity normal. No scalp soft tissue abnormality. Sinuses/Orbits: Right gaze noted. Globes and orbital soft tissues demonstrate no other acute finding. Paranasal sinuses are largely clear. Trace bilateral mastoid effusions noted, of doubtful significance. Other: None. MRA HEAD FINDINGS ANTERIOR CIRCULATION: Examination degraded by motion. Visualized distal cervical segments of the internal carotid arteries are patent with antegrade flow. Petrous, cavernous, and supraclinoid segments patent without stenosis. A1 segments patent bilaterally. Normal anterior communicating artery complex. Anterior cerebral arteries patent to their distal aspects without stenosis. Left M1 patent. Normal left MCA bifurcation. Distal left MCA branches well perfused. There is acute occlusion of the proximal right M1 segment. Minimal scant flow within right MCA branches distally. POSTERIOR CIRCULATION: Both vertebral arteries patent to the vertebrobasilar junction without stenosis. Both PICA are patent. Basilar patent to its distal aspect without stenosis. Superior cerebral arteries patent bilaterally. Left PCA supplied via the basilar. Fetal type origin of the right PCA. Both PCAs grossly perfused to their distal aspects without visible stenosis. No visible  aneurysm. MRA NECK FINDINGS AORTIC ARCH: Examination technically limited by motion and lack of IV contrast Partially visualized aortic arch within normal limits for caliber with normal branch pattern. No visible stenosis about the origin of the great vessels. RIGHT CAROTID SYSTEM: Right CCA poorly assessed due to motion,  but is grossly patent to the bifurcation. No obvious significant stenosis about the right bifurcation. Right ICA grossly patent distally to the skull base without visible stenosis or occlusion. LEFT CAROTID SYSTEM: Left CCA poorly visualized, but is grossly patent to the bifurcation. No obvious stenosis about the left bifurcation. Left ICA grossly patent to the skull base without visible stenosis or occlusion. VERTEBRAL ARTERIES: Both vertebral arteries appear to arise from the subclavian arteries. Vertebral arteries poorly assessed proximally, but are grossly patent to the skull base without visible stenosis or occlusion. IMPRESSION: MRI HEAD IMPRESSION: 1. Large evolving acute ischemic right MCA territory infarct. No associated hemorrhage or significant regional mass effect at this time. 2. Underlying mild age-related cerebral volume loss with chronic small vessel ischemic disease. MRA HEAD IMPRESSION: 1. Motion degraded exam. 2. Acute occlusion of the proximal right M1 segment. 3. No other large vessel occlusion. No other visible hemodynamically significant or correctable stenosis. MRA NECK IMPRESSION: 1. Technically limited exam due to motion and lack of IV contrast. 2. Gross patency of both carotid artery systems and vertebral arteries to the skull base. No visible stenosis or other vascular abnormality. Electronically Signed   By: Rise Mu M.D.   On: August 13, 2021 20:47   MR ANGIO NECK WO CONTRAST  Result Date: 2021/08/13 CLINICAL DATA:  Initial evaluation for acute stroke. EXAM: MRI HEAD WITHOUT CONTRAST MRA HEAD WITHOUT CONTRAST MRA NECK WITHOUT CONTRAST TECHNIQUE: Multiplanar,  multiecho pulse sequences of the brain and surrounding structures were obtained without intravenous contrast. Angiographic images of the Circle of Willis were obtained using MRA technique without intravenous contrast. Angiographic images of the neck were obtained using MRA technique without intravenous contrast. Carotid stenosis measurements (when applicable) are obtained utilizing NASCET criteria, using the distal internal carotid diameter as the denominator. COMPARISON:  Prior CT from earlier the same day. FINDINGS: MRI HEAD FINDINGS Brain: Examination degraded by motion artifact. Generalized age-related cerebral volume loss with mild chronic small vessel ischemic disease. Large confluent area of restricted diffusion involving the majority of the right MCA distribution is seen, consistent with a large evolving right MCA territory infarct. Involvement of the right caudate and lentiform nuclei noted. Associated gyral swelling and edema without significant regional mass or midline shift at this time. No associated hemorrhage visible on this motion degraded exam. No other evidence for acute or subacute ischemia. Gray-white matter differentiation otherwise maintained. No encephalomalacia to suggest chronic cortical infarction elsewhere within the brain. No other evidence for acute or chronic intracranial hemorrhage. No mass lesion or midline shift. No hydrocephalus or extra-axial fluid collection. Pituitary gland suprasellar region within normal limits. Midline structures intact. Vascular: Diminished flow void seen within the right MCA distribution. Major intracranial vascular flow voids are otherwise maintained. Skull and upper cervical spine: Craniocervical junction within normal limits. Reversal of the normal cervical lordosis with associated mild diffuse spondylosis noted within the upper cervical spine. Bone marrow signal intensity normal. No scalp soft tissue abnormality. Sinuses/Orbits: Right gaze noted. Globes  and orbital soft tissues demonstrate no other acute finding. Paranasal sinuses are largely clear. Trace bilateral mastoid effusions noted, of doubtful significance. Other: None. MRA HEAD FINDINGS ANTERIOR CIRCULATION: Examination degraded by motion. Visualized distal cervical segments of the internal carotid arteries are patent with antegrade flow. Petrous, cavernous, and supraclinoid segments patent without stenosis. A1 segments patent bilaterally. Normal anterior communicating artery complex. Anterior cerebral arteries patent to their distal aspects without stenosis. Left M1 patent. Normal left MCA bifurcation. Distal left MCA branches well perfused. There  is acute occlusion of the proximal right M1 segment. Minimal scant flow within right MCA branches distally. POSTERIOR CIRCULATION: Both vertebral arteries patent to the vertebrobasilar junction without stenosis. Both PICA are patent. Basilar patent to its distal aspect without stenosis. Superior cerebral arteries patent bilaterally. Left PCA supplied via the basilar. Fetal type origin of the right PCA. Both PCAs grossly perfused to their distal aspects without visible stenosis. No visible aneurysm. MRA NECK FINDINGS AORTIC ARCH: Examination technically limited by motion and lack of IV contrast Partially visualized aortic arch within normal limits for caliber with normal branch pattern. No visible stenosis about the origin of the great vessels. RIGHT CAROTID SYSTEM: Right CCA poorly assessed due to motion, but is grossly patent to the bifurcation. No obvious significant stenosis about the right bifurcation. Right ICA grossly patent distally to the skull base without visible stenosis or occlusion. LEFT CAROTID SYSTEM: Left CCA poorly visualized, but is grossly patent to the bifurcation. No obvious stenosis about the left bifurcation. Left ICA grossly patent to the skull base without visible stenosis or occlusion. VERTEBRAL ARTERIES: Both vertebral arteries appear  to arise from the subclavian arteries. Vertebral arteries poorly assessed proximally, but are grossly patent to the skull base without visible stenosis or occlusion. IMPRESSION: MRI HEAD IMPRESSION: 1. Large evolving acute ischemic right MCA territory infarct. No associated hemorrhage or significant regional mass effect at this time. 2. Underlying mild age-related cerebral volume loss with chronic small vessel ischemic disease. MRA HEAD IMPRESSION: 1. Motion degraded exam. 2. Acute occlusion of the proximal right M1 segment. 3. No other large vessel occlusion. No other visible hemodynamically significant or correctable stenosis. MRA NECK IMPRESSION: 1. Technically limited exam due to motion and lack of IV contrast. 2. Gross patency of both carotid artery systems and vertebral arteries to the skull base. No visible stenosis or other vascular abnormality. Electronically Signed   By: Rise Mu M.D.   On: 07/07/2021 20:47   MR BRAIN WO CONTRAST  Result Date: 07/12/2021 CLINICAL DATA:  Initial evaluation for acute stroke. EXAM: MRI HEAD WITHOUT CONTRAST MRA HEAD WITHOUT CONTRAST MRA NECK WITHOUT CONTRAST TECHNIQUE: Multiplanar, multiecho pulse sequences of the brain and surrounding structures were obtained without intravenous contrast. Angiographic images of the Circle of Willis were obtained using MRA technique without intravenous contrast. Angiographic images of the neck were obtained using MRA technique without intravenous contrast. Carotid stenosis measurements (when applicable) are obtained utilizing NASCET criteria, using the distal internal carotid diameter as the denominator. COMPARISON:  Prior CT from earlier the same day. FINDINGS: MRI HEAD FINDINGS Brain: Examination degraded by motion artifact. Generalized age-related cerebral volume loss with mild chronic small vessel ischemic disease. Large confluent area of restricted diffusion involving the majority of the right MCA distribution is seen,  consistent with a large evolving right MCA territory infarct. Involvement of the right caudate and lentiform nuclei noted. Associated gyral swelling and edema without significant regional mass or midline shift at this time. No associated hemorrhage visible on this motion degraded exam. No other evidence for acute or subacute ischemia. Gray-white matter differentiation otherwise maintained. No encephalomalacia to suggest chronic cortical infarction elsewhere within the brain. No other evidence for acute or chronic intracranial hemorrhage. No mass lesion or midline shift. No hydrocephalus or extra-axial fluid collection. Pituitary gland suprasellar region within normal limits. Midline structures intact. Vascular: Diminished flow void seen within the right MCA distribution. Major intracranial vascular flow voids are otherwise maintained. Skull and upper cervical spine: Craniocervical junction within normal limits.  Reversal of the normal cervical lordosis with associated mild diffuse spondylosis noted within the upper cervical spine. Bone marrow signal intensity normal. No scalp soft tissue abnormality. Sinuses/Orbits: Right gaze noted. Globes and orbital soft tissues demonstrate no other acute finding. Paranasal sinuses are largely clear. Trace bilateral mastoid effusions noted, of doubtful significance. Other: None. MRA HEAD FINDINGS ANTERIOR CIRCULATION: Examination degraded by motion. Visualized distal cervical segments of the internal carotid arteries are patent with antegrade flow. Petrous, cavernous, and supraclinoid segments patent without stenosis. A1 segments patent bilaterally. Normal anterior communicating artery complex. Anterior cerebral arteries patent to their distal aspects without stenosis. Left M1 patent. Normal left MCA bifurcation. Distal left MCA branches well perfused. There is acute occlusion of the proximal right M1 segment. Minimal scant flow within right MCA branches distally. POSTERIOR  CIRCULATION: Both vertebral arteries patent to the vertebrobasilar junction without stenosis. Both PICA are patent. Basilar patent to its distal aspect without stenosis. Superior cerebral arteries patent bilaterally. Left PCA supplied via the basilar. Fetal type origin of the right PCA. Both PCAs grossly perfused to their distal aspects without visible stenosis. No visible aneurysm. MRA NECK FINDINGS AORTIC ARCH: Examination technically limited by motion and lack of IV contrast Partially visualized aortic arch within normal limits for caliber with normal branch pattern. No visible stenosis about the origin of the great vessels. RIGHT CAROTID SYSTEM: Right CCA poorly assessed due to motion, but is grossly patent to the bifurcation. No obvious significant stenosis about the right bifurcation. Right ICA grossly patent distally to the skull base without visible stenosis or occlusion. LEFT CAROTID SYSTEM: Left CCA poorly visualized, but is grossly patent to the bifurcation. No obvious stenosis about the left bifurcation. Left ICA grossly patent to the skull base without visible stenosis or occlusion. VERTEBRAL ARTERIES: Both vertebral arteries appear to arise from the subclavian arteries. Vertebral arteries poorly assessed proximally, but are grossly patent to the skull base without visible stenosis or occlusion. IMPRESSION: MRI HEAD IMPRESSION: 1. Large evolving acute ischemic right MCA territory infarct. No associated hemorrhage or significant regional mass effect at this time. 2. Underlying mild age-related cerebral volume loss with chronic small vessel ischemic disease. MRA HEAD IMPRESSION: 1. Motion degraded exam. 2. Acute occlusion of the proximal right M1 segment. 3. No other large vessel occlusion. No other visible hemodynamically significant or correctable stenosis. MRA NECK IMPRESSION: 1. Technically limited exam due to motion and lack of IV contrast. 2. Gross patency of both carotid artery systems and  vertebral arteries to the skull base. No visible stenosis or other vascular abnormality. Electronically Signed   By: Rise Mu M.D.   On: 07/07/2021 20:47   DG CHEST PORT 1 VIEW  Result Date: 07/16/2021 CLINICAL DATA:  87 year old female with history of shortness of breath. Evaluate for pneumonia. EXAM: PORTABLE CHEST 1 VIEW COMPARISON:  Chest x-ray 07/05/2021 FINDINGS: Patchy ill-defined opacities and widespread areas of interstitial prominence are again noted in the lungs bilaterally, most severe in the left upper lobe. Aeration appears slightly improved compared to the recent prior study. Chronic elevation of the left hemidiaphragm. No pneumothorax. No evidence of pulmonary edema. Moderate cardiomegaly. The patient is rotated to the right on today's exam, resulting in distortion of the mediastinal contours and reduced diagnostic sensitivity and specificity for mediastinal pathology. Atherosclerotic calcifications in the thoracic aorta. IMPRESSION: 1. Improving aeration with what appears to be resolving multilobar bilateral pneumonia. 2. Moderate cardiomegaly. 3. Aortic atherosclerosis. Electronically Signed   By: Brayton Mars.D.  On: 07/16/2021 10:17   DG Chest Port 1 View  Result Date: 07/03/2021 CLINICAL DATA:  Hypoxia. New onset aphasia and left-sided paralysis. EXAM: PORTABLE CHEST 1 VIEW COMPARISON:  One-view chest x-ray 05/06/2020 FINDINGS: Heart is enlarged. Atherosclerotic changes are noted within the aorta. Asymmetric interstitial and airspace disease is present throughout the left lung. There is some increased opacity at the right base. Chronic elevation of left hemidiaphragm again noted. IMPRESSION: 1. Stable cardiomegaly. 2. Asymmetric interstitial and airspace disease throughout the left lung concerning for pneumonia. This may represent asymmetric edema. 3. Increased opacity at the right base likely represents atelectasis. Electronically Signed   By: Marin Robertshristopher  Mattern  M.D.   On: 07/08/2021 14:50   ECHOCARDIOGRAM COMPLETE  Result Date: 07/16/2021    ECHOCARDIOGRAM REPORT   Patient Name:   Latasha NatterMOLLIE N Norton Date of Exam: 07/16/2021 Medical Rec #:  [id number]      Height:       66.0 in Accession #:    [id number]     Weight:       121.7 lb Date of Birth:  [birth year]      BSA:          1.619 m Patient Age:    89 years       BP:           155/85 mmHg Patient Gender: F              HR:           102 bpm. Exam Location:  Inpatient Procedure: 2D Echo Indications:     stroke  History:         Patient has prior history of Echocardiogram examinations, most                  recent 04/07/2020. Sick sinus syndrome, chronic kidney disease;                  Risk Factors:Hypertension.  Sonographer:     Delcie RochLauren Pennington Referring Phys:  [id number] Kara MeadSTEVI W TOBERMAN Diagnosing Phys: Orpah CobbAjay Siani Utke MD  Sonographer Comments: Image acquisition challenging due to uncooperative patient and Image acquisition challenging due to respiratory motion. IMPRESSIONS  1. Left ventricular ejection fraction, by estimation, is 25 to 30%. The left ventricle has severely decreased function. The left ventricle demonstrates global hypokinesis. There is mild concentric left ventricular hypertrophy. Left ventricular diastolic  parameters are consistent with Grade I diastolic dysfunction (impaired relaxation). There is severe hypokinesis of the left ventricular, entire septal wall and inferior wall.  2. Right ventricular systolic function is normal. The right ventricular size is normal. There is mildly elevated pulmonary artery systolic pressure.  3. Left atrial size was severely dilated.  4. Right atrial size was mildly dilated.  5. The mitral valve is degenerative. Moderate to severe mitral valve regurgitation. Moderate mitral annular calcification.  6. The aortic valve is tricuspid. There is mild calcification of the aortic valve. There is mild thickening of the aortic valve. Aortic valve regurgitation is moderate.  7. There is  mild (Grade II) atheroma plaque involving the aortic root and ascending aorta.  8. The inferior vena cava is normal in size with <50% respiratory variability, suggesting right atrial pressure of 8 mmHg. FINDINGS  Left Ventricle: Left ventricular ejection fraction, by estimation, is 25 to 30%. The left ventricle has severely decreased function. The left ventricle demonstrates global hypokinesis. Severe hypokinesis of the left ventricular, entire septal wall and inferior wall. The left ventricular internal cavity  size was normal in size. There is mild concentric left ventricular hypertrophy. Left ventricular diastolic parameters are consistent with Grade I diastolic dysfunction (impaired relaxation). Right Ventricle: The right ventricular size is normal. No increase in right ventricular wall thickness. Right ventricular systolic function is normal. There is mildly elevated pulmonary artery systolic pressure. The tricuspid regurgitant velocity is 2.96  m/s, and with an assumed right atrial pressure of 3 mmHg, the estimated right ventricular systolic pressure is 38.0 mmHg. Left Atrium: Left atrial size was severely dilated. Right Atrium: Right atrial size was mildly dilated. Pericardium: There is no evidence of pericardial effusion. Mitral Valve: The mitral valve is degenerative in appearance. There is moderate thickening of the mitral valve leaflet(s). There is moderate calcification of the mitral valve leaflet(s). Moderate mitral annular calcification. Moderate to severe mitral valve regurgitation. Tricuspid Valve: The tricuspid valve is normal in structure. Tricuspid valve regurgitation is mild. Aortic Valve: The aortic valve is tricuspid. There is mild calcification of the aortic valve. There is mild thickening of the aortic valve. There is mild aortic valve annular calcification. Aortic valve regurgitation is moderate. Aortic regurgitation PHT  measures 283 msec. Pulmonic Valve: The pulmonic valve was normal in  structure. Pulmonic valve regurgitation is not visualized. Aorta: The aortic root is normal in size and structure. There is mild (Grade II) atheroma plaque involving the aortic root and ascending aorta. Venous: The inferior vena cava is normal in size with less than 50% respiratory variability, suggesting right atrial pressure of 8 mmHg. IAS/Shunts: The atrial septum is grossly normal.  LEFT VENTRICLE PLAX 2D LVIDd:         4.70 cm LVIDs:         4.20 cm LV PW:         1.10 cm LV IVS:        1.20 cm LVOT diam:     1.80 cm LV SV:         29 LV SV Index:   18 LVOT Area:     2.54 cm  RIGHT VENTRICLE             IVC RV S prime:     11.10 cm/s  IVC diam: 1.50 cm TAPSE (M-mode): 1.8 cm LEFT ATRIUM             Index       RIGHT ATRIUM           Index LA diam:        4.50 cm 2.78 cm/m  RA Area:     12.80 cm LA Vol (A2C):   85.8 ml 52.99 ml/m RA Volume:   25.50 ml  15.75 ml/m LA Vol (A4C):   61.9 ml 38.23 ml/m LA Biplane Vol: 72.7 ml 44.90 ml/m  AORTIC VALVE LVOT Vmax:   90.00 cm/s LVOT Vmean:  54.500 cm/s LVOT VTI:    0.112 m AI PHT:      283 msec  AORTA Ao Root diam: 2.80 cm MR Peak grad: 106.1 mmHg  TRICUSPID VALVE MR Mean grad: 56.0 mmHg   TR Peak grad:   35.0 mmHg MR Vmax:      515.00 cm/s TR Vmax:        296.00 cm/s MR Vmean:     344.0 cm/s                           SHUNTS  Systemic VTI:  0.11 m                           Systemic Diam: 1.80 cm Orpah Cobb MD Electronically signed by Orpah Cobb MD Signature Date/Time: 07/16/2021/3:43:21 PM    Final    CT HEAD CODE STROKE WO CONTRAST  Result Date: 07/30/2021 CLINICAL DATA:  Code stroke.  Acute neuro deficit. EXAM: CT HEAD WITHOUT CONTRAST TECHNIQUE: Contiguous axial images were obtained from the base of the skull through the vertex without intravenous contrast. COMPARISON:  CT head 05/06/2020 FINDINGS: Brain: Large territory cytotoxic edema right MCA territory. Edema is present in the right frontal and parietal lobe extending into  the insula and right caudate nucleus. Involvement of the anterior limb internal capsule. Ventricle size normal.  No midline shift.  No acute hemorrhage. Vascular: Atherosclerotic calcification in the cavernous carotid bilaterally. No hyperdense vessel in the M1 segment. There are hyperdense vessels in the right sylvian fissure which may be due to thrombosed MCA branches. Skull: Negative Sinuses/Orbits: Paranasal sinuses clear. Bilateral cataract extraction Other: None ASPECTS ([place]berta Stroke Program Early CT Score) - Ganglionic level infarction (caudate, lentiform nuclei, internal capsule, insula, M1-M3 cortex): 1 - Supraganglionic infarction (M4-M6 cortex): 0 Total score (0-10 with 10 being normal): 1 IMPRESSION: 1. Large territory acute infarct right MCA territory. No hemorrhage or midline shift. 2. ASPECTS is 1 3. Code stroke imaging results were communicated on 07/01/2021 at 1:04 pm to provider Bhagat via text page Electronically Signed   By: Marlan Palau M.D.   On: 07/02/2021 13:07   VAS US CAROTID  Result Date: 07/16/2021 Carotid Arterial Duplex Study Patient Name:  Latasha Norton  Date of Exam:   07/16/2021 Medical Rec #: [id number]       Accession #:    [id number] Date of Birth: [birth year]       Patient Gender: F Patient Age:   89Y Exam Location:  [facility] Procedure:      VAS US CAROTID Referring Phys: [id number] Garner Gavel --------------------------------------------------------------------------------  Indications:       CVA. Limitations        Today's exam was limited due to Patient somnolence, inability                    to reposition, anatomy/tortuosity of vessels. Comparison Study:  07/14/2021 MRA Neck suboptimal visualization of bilateral                    carotids. Performing Technologist: Jean Rosenthal RDMS,RVT  Examination Guidelines: A complete evaluation includes B-mode imaging, spectral Doppler, color Doppler, and power Doppler as needed of all accessible portions of  each vessel. Bilateral testing is considered an integral part of a complete examination. Limited examinations for reoccurring indications may be performed as noted.  Right Carotid Findings: +----------+--------+--------+--------+------------------+--------+           PSV cm/sEDV cm/sStenosisPlaque DescriptionComments +----------+--------+--------+--------+------------------+--------+ CCA Prox  45      10                                tortuous +----------+--------+--------+--------+------------------+--------+ CCA Mid                                             tortuous +----------+--------+--------+--------+------------------+--------+ CCA  Distal55      8                                          +----------+--------+--------+--------+------------------+--------+ ICA Prox  58      15      1-39%   heterogenous      tortuous +----------+--------+--------+--------+------------------+--------+ ICA Distal31      9                                          +----------+--------+--------+--------+------------------+--------+ ECA       73                                                 +----------+--------+--------+--------+------------------+--------+ +----------+--------+-------+----------------+-------------------+           PSV cm/sEDV cmsDescribe        Arm Pressure (mmHG) +----------+--------+-------+----------------+-------------------+ [id number]             Multiphasic, WNL                    +----------+--------+-------+----------------+-------------------+ +---------+--------+--+--------+-+---------+ VertebralPSV cm/s30EDV cm/s7Antegrade +---------+--------+--+--------+-+---------+  Left Carotid Findings: +----------+--------+--------+--------+------------------+--------+           PSV cm/sEDV cm/sStenosisPlaque DescriptionComments +----------+--------+--------+--------+------------------+--------+ CCA Prox  93      11                                          +----------+--------+--------+--------+------------------+--------+ CCA Distal76      13                                         +----------+--------+--------+--------+------------------+--------+ ICA Prox  59      17      1-39%   heterogenous               +----------+--------+--------+--------+------------------+--------+ ICA Distal47      13                                         +----------+--------+--------+--------+------------------+--------+ ECA       59                                                 +----------+--------+--------+--------+------------------+--------+ +----------+--------+--------+----------------+-------------------+           PSV cm/sEDV cm/sDescribe        Arm Pressure (mmHG) +----------+--------+--------+----------------+-------------------+ [id number]             Multiphasic, WNL                    +----------+--------+--------+----------------+-------------------+ +---------+--------+--+--------+--+---------+ VertebralPSV cm/s59EDV cm/s14Antegrade +---------+--------+--+--------+--+---------+   Summary: Right Carotid: Velocities in the right ICA are consistent with a 1-39% stenosis. Left Carotid: Velocities in the left ICA are consistent with a  1-39% stenosis. Vertebrals:  Bilateral vertebral arteries demonstrate antegrade flow. Subclavians: Normal flow hemodynamics were seen in bilateral subclavian              arteries. *See table(s) above for measurements and observations.     Preliminary     Review Of Systems Constitutional: No fever, chills, weight loss or gain. Eyes: Positive vision change, wears glasses. No discharge or pain. Ears: Positive hearing loss, No tinnitus. Respiratory: No asthma, COPD, pneumonias. Positive shortness of breath. No hemoptysis. Cardiovascular: Positive chest pain, palpitation,  no leg edema. Gastrointestinal: No nausea, vomiting, diarrhea, constipation. No GI bleed. No  hepatitis. Genitourinary: No dysuria, hematuria, kidney stone. No incontinance. Neurological: No headache, positive stroke, seizures.  Psychiatry: No psych facility admission for anxiety, depression, suicide. No detox. Skin: No rash. Musculoskeletal: Positive joint pain, fibromyalgia, neck pain, back pain. Lymphadenopathy: No lymphadenopathy. Hematology: No anemia or easy bruising.   Blood pressure (!) 168/75, pulse (!) 102, temperature 99.8 F (37.7 C), temperature source Axillary, resp. rate (!) 27, height  (1.676 m), weight 55.2 kg, SpO2 100 %. Body mass index is 19.64 kg/m. General appearance: alert, cooperative, appears stated age and no distress Head: Normocephalic, atraumatic. Eyes: Brown eyes, pink conjunctiva, corneas clear.  Neck: No adenopathy, no carotid bruit, no JVD, supple, symmetrical, trachea midline and thyroid not enlarged. Resp: Clear to auscultation bilaterally. Cardio: Regular rate and rhythm, S1, S2 normal, II/VI systolic murmur, no click, rub or gallop GI: Soft, non-tender; bowel sounds normal; no organomegaly. Extremities: No edema, cyanosis or clubbing. Skin: Warm and dry.  Neurologic: Alert and oriented X 0. Left sided flaccidity.  Assessment/Plan Chronic systolic left heart failure HTN Hypothyroidism Acute right MCA stroke  Plan: Low dose carvedilol for sinus tachycardia and CHF. Agree with palliative care consult.  Time spent: Review of old records, Lab, x-rays, EKG, other cardiac tests, examination, discussion with patient/Nurse/Tech/referring doctor over 70 minutes.  Ricki Rodriguez, MD  07/16/2021, 4:26 PM

## 2021-07-16 NOTE — Evaluation (Addendum)
Physical Therapy Evaluation Patient Details Name: Latasha Norton MRN: [id number] DOB: 09-Nov-1925 Today's Date: 07/16/2021   History of Present Illness  Pt is a 87 y/o female admitted secondary to L sided weakness, R gaze and aphasia. MRI of the brain revealed an acute evolving R MCA infarct. PMH including but not limited to chronic systolic CHF, LVEF 45 to 50% 2021, CKD stage III, HTN, hypothyroidism.   Clinical Impression  Pt presented supine in bed with HOB elevated, initially asleep and difficult to arouse or maintain arousal level throughout evaluation. No family/caregivers present during evaluation either. Per chart review, pt was living by herself and independent with functional mobility prior to arrival. PT evaluation was very limited secondary to pt's arousal level and difficulty following commands. She was unable to open her eyes at all during the evaluation. She did speak to the PT for brief periods about random topics. She demonstrated no active movement of either LE or her L UE throughout. She did respond to noxious stimuli to her LEs but not her L UE. She currently requires total assistance for functional mobility. Pt would continue to benefit from skilled physical therapy services at this time while admitted and after d/c to address the below listed limitations in order to improve overall safety and independence with functional mobility.  BP 162/66 mmHg HR 79-86 bpm SpO2 at 100% on 2.5L     Follow Up Recommendations SNF    Equipment Recommendations  None recommended by PT    Recommendations for Other Services       Precautions / Restrictions Precautions Precautions: Fall Restrictions Weight Bearing Restrictions: No      Mobility  Bed Mobility Overal bed mobility: Needs Assistance Bed Mobility: Supine to Sit;Sit to Supine     Supine to sit: Total assist Sit to supine: Total assist   General bed mobility comments: pt required total A for management of her trunk to  achieve a long sitting position in bed. she did not attempt to help and did not have any active muscle activation throughout    Transfers                 General transfer comment: deferred due to level of arousal  Ambulation/Gait                Stairs            Wheelchair Mobility    Modified Rankin (Stroke Patients Only) Modified Rankin (Stroke Patients Only) Pre-Morbid Rankin Score: No symptoms Modified Rankin: Severe disability     Balance Overall balance assessment: Needs assistance   Sitting balance-Leahy Scale: Zero Sitting balance - Comments: total A required                                     Pertinent Vitals/Pain Pain Assessment: Faces Faces Pain Scale: No hurt Pain Intervention(s): Monitored during session    Home Living Family/patient expects to be discharged to:: Unsure                 Additional Comments: Pt unable to provide any reliable information and no family present to provide information either. Per chart review, pt was from home alone and independent.    Prior Function Level of Independence: Independent         Comments: Pt unable to provide any reliable information and no family present to provide information either. Per chart review, pt  was from home alone and independent.     Hand Dominance        Extremity/Trunk Assessment   Upper Extremity Assessment Upper Extremity Assessment: LUE deficits/detail LUE Deficits / Details: pt with no active movement of her L arm, no response to painful stimuli    Lower Extremity Assessment Lower Extremity Assessment: LLE deficits/detail;RLE deficits/detail RLE Deficits / Details: pt with no active movement of her LEs throughout evaluation; only responded to noxious stimuli LLE Deficits / Details: pt with no active movement of her LEs throughout evaluation; only responded to noxious stimuli       Communication   Communication: Expressive  difficulties;Receptive difficulties  Cognition Arousal/Alertness: Lethargic Behavior During Therapy: Flat affect Overall Cognitive Status: Difficult to assess                                        General Comments      Exercises     Assessment/Plan    PT Assessment Patient needs continued PT services  PT Problem List Decreased strength;Decreased range of motion;Decreased activity tolerance;Decreased balance;Decreased mobility;Decreased cognition;Decreased coordination;Decreased knowledge of use of DME;Decreased safety awareness;Decreased knowledge of precautions;Impaired sensation       PT Treatment Interventions DME instruction;Gait training;Stair training;Functional mobility training;Therapeutic activities;Therapeutic exercise;Balance training;Neuromuscular re-education;Cognitive remediation;Patient/family education;Wheelchair mobility training    PT Goals (Current goals can be found in the Care Plan section)  Acute Rehab PT Goals Patient Stated Goal: unable to state PT Goal Formulation: Patient unable to participate in goal setting Time For Goal Achievement: 07/30/21 Potential to Achieve Goals: Fair    Frequency Min 3X/week   Barriers to discharge        Co-evaluation               AM-PAC PT "6 Clicks" Mobility  Outcome Measure Help needed turning from your back to your side while in a flat bed without using bedrails?: Total Help needed moving from lying on your back to sitting on the side of a flat bed without using bedrails?: Total Help needed moving to and from a bed to a chair (including a wheelchair)?: Total Help needed standing up from a chair using your arms (e.g., wheelchair or bedside chair)?: Total Help needed to walk in hospital room?: Total Help needed climbing 3-5 steps with a railing? : Total 6 Click Score: 6    End of Session   Activity Tolerance: Patient limited by lethargy Patient left: in bed;with call bell/phone within  reach;with bed alarm set;Other (comment);with SCD's reapplied (R hand mitten donned) Nurse Communication: Mobility status PT Visit Diagnosis: Other abnormalities of gait and mobility (R26.89);Hemiplegia and hemiparesis Hemiplegia - Right/Left: Left Hemiplegia - caused by: Cerebral infarction    Time: 4315-4008 PT Time Calculation (min) (ACUTE ONLY): 12 min   Charges:   PT Evaluation $PT Eval Moderate Complexity: 1 Mod          Ginette Pitman, PT, DPT  Acute Rehabilitation Services Office [phone]   Latasha Norton 07/16/2021, 10:05 AM

## 2021-07-16 NOTE — TOC Initial Note (Signed)
Transition of Care [facility]) - Initial/Assessment Note    Patient Details  Name: Latasha Norton MRN: [id number] Date of Birth: 12-03-1925  [facility] CM/SW Contact:    Lorri Frederick, LCSW Phone Number: 07/16/2021, 3:00 PM  Clinical Narrative:  CSW attempted to speak with pt who was unable to participate.  Daughter Britta Mccreedy initially not in room, called listed phone number, which is a home number, was told Britta Mccreedy is currently at the hospital, given cell number: [phone].  Daughter not answering at that number either.   1430: Daughter now in room.  Discussed recommendation for SNF, daughter in agreement with this plan.  Choice document given.  Pt is not vaccinated for covid.  Daughter indicates interest in [facility].  Pt has been to [facility] before.  Daughter will review facilities and weekday CSW will follow up with options later.                   Expected Discharge Plan: Skilled Nursing Facility Barriers to Discharge: Continued Medical Work up, SNF Pending bed offer   Patient Goals and CMS Choice   CMS Medicare.gov Compare Post Acute Care list provided to:: Patient Represenative (must comment) Choice offered to / list presented to : Adult Children  Expected Discharge Plan and Services Expected Discharge Plan: Skilled Nursing Facility     Post Acute Care Choice: Skilled Nursing Facility Living arrangements for the past 2 months: Single Family Home                                      Prior Living Arrangements/Services Living arrangements for the past 2 months: Single Family Home Lives with:: Self Patient language and need for interpreter reviewed:: Yes        Need for Family Participation in Patient Care: Yes (Comment) Care giver support system in place?: Yes (comment) Current home services: Other (comment) (none) Criminal Activity/Legal Involvement Pertinent to Current Situation/Hospitalization: No - Comment as needed  Activities of  Daily Living      Permission Sought/Granted                  Emotional Assessment Appearance:: Appears stated age Attitude/Demeanor/Rapport: Unable to Assess Affect (typically observed): Unable to Assess Orientation: : Oriented to Self Alcohol / Substance Use: Not Applicable Psych Involvement: No (comment)  Admission diagnosis:  CVA (cerebral vascular accident) (HCC) [I63.9] Acute ischemic stroke [facility]) [I63.9] Patient Active Problem List   Diagnosis Date Noted   Stroke (cerebrum) (HCC) 07/22/2021   Acute ischemic stroke (HCC) 07/28/2021   Rhinitis, allergic 04/19/2020   SSS (sick sinus syndrome) (HCC)    Hypokalemia 04/06/2020   Benign essential HTN 04/06/2020   Hypothyroid 04/06/2020   Acute exacerbation of CHF (congestive heart failure) (HCC) 04/06/2020   CKD (chronic kidney disease), stage III (HCC) 04/06/2020   Bradycardia 04/06/2020   PCP:  Patient, No Pcp Per (Inactive) Pharmacy:   [id number] [place], [place] - [address] [place] [address] Phone: [phone] Fax: [phone]     Social Determinants of Health (SDOH) Interventions    Readmission Risk Interventions No flowsheet data found.

## 2021-07-16 NOTE — Evaluation (Signed)
Speech Language Pathology Evaluation Patient Details Name: Latasha Norton MRN: [id number] DOB: 08-Sep-1925 Today's Date: 07/16/2021 Time: 1211-1225 SLP Time Calculation (min) (ACUTE ONLY): 14 min  Problem List:  Patient Active Problem List   Diagnosis Date Noted   Stroke (cerebrum) (HCC) 07/19/2021   Acute ischemic stroke (HCC) 07/14/2021   Rhinitis, allergic 04/19/2020   SSS (sick sinus syndrome) (HCC)    Hypokalemia 04/06/2020   Benign essential HTN 04/06/2020   Hypothyroid 04/06/2020   Acute exacerbation of CHF (congestive heart failure) (HCC) 04/06/2020   CKD (chronic kidney disease), stage III (HCC) 04/06/2020   Bradycardia 04/06/2020   Past Medical History:  Past Medical History:  Diagnosis Date   CHF (congestive heart failure) (HCC)    Hypertension    SSS (sick sinus syndrome) (HCC)    Thyroid disease    Past Surgical History: History reviewed. No pertinent surgical history. HPI:  Pt is a 87 y/o female admitted secondary to L sided weakness, R gaze and difficulty with speech. MRI of the brain revealed an acute evolving R MCA infarct. PMH: chronic systolic CHF, LVEF 45 to 50% 2021, CKD stage III, HTN, hypothyroidism.   Assessment / Plan / Recommendation Clinical Impression  Pt was seen for speech, language, cognition evaluation. Pt's family had left immediately prior to the evaluation with expression of not being able to "take anymore" and per EMR, a palliative meeting is currently scheduled for today at 1300. Pt demonstrated moderate dysarthria characterized by reduced articulatory precision, a hoarse vocal quality, and reduced vocal intensity which negatively impacted speech intelligibility. Pt was able to communicate verbally with syntactically-accurate sentences, but pt's utterances were often unrelated to questions. Pt presented with cognitive-linguistic impairments in at least the areas of attention, memory, and orientation. Pt's completion of auditory comprehension  tasks, and assessment of additional cognitive areas were difficult due to pt's waning alertness as well as impairments in attention and memory. Skilled SLP services are clinically indicated at this time for speech intelligibility, diagnostic treatment, and cognitive-linguistic intervention.    SLP Assessment  SLP Recommendation/Assessment: Patient needs continued Speech Lanaguage Pathology Services SLP Visit Diagnosis: Cognitive communication deficit (R41.841);Dysarthria and anarthria (R47.1)    Follow Up Recommendations  Skilled Nursing facility;24 hour supervision/assistance    Frequency and Duration min 2x/week  2 weeks      SLP Evaluation Cognition  Overall Cognitive Status: History of cognitive impairments - at baseline Arousal/Alertness: Lethargic Orientation Level: Oriented to person;Disoriented to place;Disoriented to time;Disoriented to situation Attention: Focused;Sustained Focused Attention: Impaired Focused Attention Impairment: Verbal basic Sustained Attention: Impaired Sustained Attention Impairment: Verbal basic Memory: Impaired Memory Impairment: Retrieval deficit;Decreased recall of new information Awareness: Impaired Awareness Impairment: Intellectual impairment       Comprehension  Auditory Comprehension Overall Auditory Comprehension: Impaired Basic Immediate Environment Questions:  (5/5) Complex Questions:  (1/3) Commands: Impaired One Step Basic Commands:  (0/5) Visual Recognition/Discrimination Discrimination: Not tested Reading Comprehension Reading Status: Not tested    Expression Expression Primary Mode of Expression: Verbal Verbal Expression Initiation: No impairment Level of Generative/Spontaneous Verbalization: Sentence Pragmatics: Impairment Impairments: Topic maintenance;Eye contact Written Expression Dominant Hand: Right   Oral / Motor  Oral Motor/Sensory Function Overall Oral Motor/Sensory Function: Moderate impairment Facial ROM:  Reduced left;Suspected CN VII (facial) dysfunction Facial Symmetry: Abnormal symmetry left;Suspected CN VII (facial) dysfunction Facial Strength: Reduced left;Suspected CN VII (facial) dysfunction Motor Speech Overall Motor Speech: Impaired Phonation: Low vocal intensity;Hoarse Articulation: Impaired Level of Impairment: Sentence Intelligibility: Intelligibility reduced Word: 75-100% accurate Phrase: 50-74% accurate  Sentence: 25-49% accurate Conversation: Not tested Motor Speech Errors: Consistent   Dayvian Blixt I. Vear Clock, MS, CCC-SLP Acute Rehabilitation Services Office number [phone] Pager [phone]                    Scheryl Marten 07/16/2021, 1:38 PM

## 2021-07-16 NOTE — Progress Notes (Signed)
Carotid duplex bilateral study completed.   Please see CV Proc for preliminary results.   Erron Wengert, RDMS, RVT  

## 2021-07-16 NOTE — Progress Notes (Signed)
Hydralazine not given, per order and conversation with Dr Rachael Darby.  Resting comfortably.  Denies CP or any other pain.

## 2021-07-16 NOTE — Consult Note (Signed)
Consultation Note Date: 07/16/2021   Patient Name: Latasha Norton  DOB: [birth year]  MRN: [id number]  Age / Sex: 87 y.o., female  PCP: Patient, No Pcp Per (Inactive) Referring Physician: Donne Hazel, MD  Reason for Consultation: Establishing goals of care  HPI/Patient Profile: 87 y.o. female  with past medical history of chronic systolic CHF (EF 06-23% in 2021), CKD stage III, HTN, and hypothyroidism presenting to the emergency department on 07/18/2021 with left-sided weakness and aphasia. Family reports she has a history of non-compliance with her BP medications. Family had a phone conversation with patient the prior evening and patient was speaking normally. The morning of 7/16, family went into her room and found patient on the floor with obvious left-sided weakness and unable to stand or speak.  In the ED, Blood pressure significantly elevated 176/66, hypoxia stabilized on 5L. CT head showing large right-sided MCA acute infarct. MRI confirmed large evolving ischemic right MCA territory infarct with no areas of hemorrhage.  On 7/17, patient unfortunately suffered a NSTEMI (troponin greater than 9000).   Clinical Assessment and Goals of Care: I have reviewed medical records, examined the patient, and discussed with Dr. Wyline Copas. He has just spoken with daughter and informed her that patient had a NSTEMI.    At bedside, patient attempts to speak and interact with family but speech is mostly unintelligible. I met in the 3W waiting room with daughter Latasha Norton, granddaughter Latasha Norton, and great-granddaughter Latasha Norton to discuss diagnosis, prognosis, GOC, EOL wishes, disposition, and options. Sons Latasha Norton and Curnie participated in the discussion via speaker phone.   I introduced Palliative Medicine as specialized medical care for people living with serious illness. It focuses on providing relief from the symptoms and stress  of a serious illness.   We discussed a brief life review of the patient. She [place] farm on the [place]. She eventually re-located to [place] with her husband. They had 1 daughter and 2 sons. She has "many" grandchildren. Her husband passed away some years ago. 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 In retirement, she has enjoyed traveling and has been to many [place] states including [place].   At baseline, Mrs. Bartling lived independently. As far as functional status, she is able to cook, bathe, and dress herself without assistance. She walks with a walker at baseline.    We reviewed patient's hospital course in detail, starting with that she had a large stroke. She had also unfortunately suffered a NSTEMI.  Discussed the natural disease trajectory of a sudden, severe neurological injury such as stroke. Discussed that she was high risk to have another stroke or other thrombotic event. Also discussed that patient will not return to her previous baseline. Expressed concern regarding deficits secondary to stroke, including left-sided weakness, aphasia, and dysphagia.   Discussed SLP evaluation and recommendations to keep NPO. Provided education that patient likely has irreversible dysphagia secondary to large stroke.  Daughter inquires about a feeding tube. Discussed benefits versus burdens of  artificial feeding. Emphasized that artificial feeding has not been shown to prolong life or promote quality of life in patients with advanced illness. Focused on the burdens of feeding tubes (possible infection, loss of pleasure from eating, use of restraints etc). Also discussed that artificial feeding does not eliminate the risk of aspiration.   I attempted to elicit values and goals of care important to the patient. Mrs. Drakeford clearly valued her independence. We discussed that even best case scenario she would be completely dependent on others for care for the remainder of her  life. I asked family if Mrs. Amacher had ever expressed her wishes regarding EOL. Family reports that she was very optimistic and never talked about EOL.  The difference between full scope medical intervention and comfort care was considered.  I introduced the concept of a comfort path to family, emphasizing that this path involves de-escalating and stopping full scope medical interventions, allowing a natural course to occur. Discussed that the goal is comfort and dignity rather than cure/prolonging life. Encouraged family to consider at what point they would want to stop full scope medical interventions, keeping in mind the concept of quality of life.   Daughter is having a difficult time with the concept of comfort care. 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 However, granddaughter Latasha Norton states that she does not think Mrs. Devincent would want a feeding tube.   We did discuss code status. Strongly recommended family to consider DNR/DNI status understanding evidenced based poor outcomes in similar hospitalized patients, as the cause of the arrest is likely associated with chronic/terminal disease rather than a reversible acute cardio-pulmonary event. Emphasized that DNR is a protective measure to keep [place] from harming the patient in their last moments of life. Family were not agreeable to DNR/DNI at this time.    The family was provided with PMT contact info and encouraged to call with questions or concerns.    Primary decision maker: Daughter Latasha Norton, and 2 sons Latasha Norton and [place].     SUMMARY OF RECOMMENDATIONS   Full code with ongoing discussion Continue current medical care Family has been educated extensively on patient's condition and poor prognosis  Family is not ready for comfort care, will need ongoing support and time to process PMT will continue to follow  Code Status/Advance Care Planning: Full code  Palliative Prophylaxis:  Aspiration, Oral Care, and  Turn Reposition  Additional Recommendations (Limitations, Scope, Preferences): Full Scope Treatment  Psycho-social/Spiritual:  Created space and opportunity for patient and family to express thoughts and feelings regarding patient's current medical situation.  Emotional support provided   Prognosis:  Difficult to determine, she is at high risk for   Discharge Planning: To Be Determined      I have reviewed the medical record, interviewed the patient and family, and examined the patient. The following aspects are pertinent.  Past Medical History:  Diagnosis Date   CHF (congestive heart failure) (HCC)    Hypertension    SSS (sick sinus syndrome) (HCC)    Thyroid disease     Scheduled Meds:   stroke: mapping our early stages of recovery book   Does not apply Once   aspirin  150 mg Rectal Daily   carvedilol  3.125 mg Oral BID WC   furosemide  20 mg Intravenous Daily   [START ON 07/19/2021] levothyroxine  75 mcg Intravenous Daily   sodium chloride flush  3 mL Intravenous Once   Continuous Infusions:  sodium chloride 50 mL/hr at 07/16/21 1304   azithromycin Stopped (07/16/21 0327)   cefTRIAXone (ROCEPHIN)  IV Stopped (07/16/21 0239)   PRN Meds:.acetaminophen **OR** acetaminophen (TYLENOL) oral liquid 160 mg/5 mL **OR** acetaminophen, acetaminophen, labetalol, senna-docusate  No Known Allergies Review of Systems  Unable to perform ROS  Physical Exam Vitals reviewed.  Constitutional:      General: She is not in acute distress.    Appearance: She is ill-appearing.  Cardiovascular:     Rate and Rhythm: Normal rate.  Pulmonary:     Effort: Pulmonary effort is normal.  Neurological:     Mental Status: She is alert.     Motor: Weakness present.  Psychiatric:     Comments: Unintelligible speech    Vital Signs: BP (!) 148/63   Pulse 87   Temp 99.6 F (37.6 C) (Axillary)   Resp (!) 24   Ht 5' 6"  (1.676 m)   Wt 55.2 kg   SpO2 100%   BMI 19.64 kg/m  Pain Scale:  PAINAD   Pain Score: 0-No pain   SpO2: SpO2: 100 % O2 Device:SpO2: 100 % O2 Flow Rate: .O2 Flow Rate (L/min): 2 L/min  IO: Intake/output summary:  Intake/Output Summary (Last 24 hours) at 07/16/2021 1808 Last data filed at 07/16/2021 1703 Gross per 24 hour  Intake 350 ml  Output 750 ml  Net -400 ml    LBM: Last BM Date: 07/19/2021 Baseline Weight: Weight: 55.2 kg Most recent weight: Weight: 55.2 kg      Palliative Assessment/Data: PPS 10% due to NPO     Time In: 1300 Time Out: 1415 Time Total: 75 minutes Greater than 50%  of this time was spent counseling and coordinating care related to the above assessment and plan.  Signed by: Lavena Bullion, NP   Please contact Palliative Medicine Team phone at [phone] for questions and concerns.  For individual provider: See Shea Evans

## 2021-07-17 DIAGNOSIS — I639 Cerebral infarction, unspecified: Secondary | ICD-10-CM | POA: Diagnosis not present

## 2021-07-17 DIAGNOSIS — F800 Phonological disorder: Secondary | ICD-10-CM

## 2021-07-17 DIAGNOSIS — R0682 Tachypnea, not elsewhere classified: Secondary | ICD-10-CM | POA: Diagnosis not present

## 2021-07-17 DIAGNOSIS — Z515 Encounter for palliative care: Secondary | ICD-10-CM | POA: Diagnosis not present

## 2021-07-17 DIAGNOSIS — Z7189 Other specified counseling: Secondary | ICD-10-CM | POA: Diagnosis not present

## 2021-07-17 DIAGNOSIS — Z9189 Other specified personal risk factors, not elsewhere classified: Secondary | ICD-10-CM

## 2021-07-17 LAB — COMPREHENSIVE METABOLIC PANEL
ALT: 32 U/L (ref 0–44)
AST: 114 U/L — ABNORMAL HIGH (ref 15–41)
Albumin: 2.6 g/dL — ABNORMAL LOW (ref 3.5–5.0)
Alkaline Phosphatase: 41 U/L (ref 38–126)
Anion gap: 9 (ref 5–15)
BUN: 30 mg/dL — ABNORMAL HIGH (ref 8–23)
CO2: 21 mmol/L — ABNORMAL LOW (ref 22–32)
Calcium: 8.9 mg/dL (ref 8.9–10.3)
Chloride: 117 mmol/L — ABNORMAL HIGH (ref 98–111)
Creatinine, Ser: 1.39 mg/dL — ABNORMAL HIGH (ref 0.44–1.00)
GFR, Estimated: 35 mL/min — ABNORMAL LOW (ref >60)
Glucose, Bld: 106 mg/dL — ABNORMAL HIGH (ref 70–99)
Potassium: 3.7 mmol/L (ref 3.5–5.1)
Sodium: 147 mmol/L — ABNORMAL HIGH (ref 135–145)
Total Bilirubin: 0.6 mg/dL (ref 0.3–1.2)
Total Protein: 7.1 g/dL (ref 6.5–8.1)

## 2021-07-17 LAB — CBC
HCT: 35.5 % — ABNORMAL LOW (ref 36.0–46.0)
Hemoglobin: 11 g/dL — ABNORMAL LOW (ref 12.0–15.0)
MCH: 28.3 pg (ref 26.0–34.0)
MCHC: 31 g/dL (ref 30.0–36.0)
MCV: 91.3 fL (ref 80.0–100.0)
Platelets: 319 10*3/uL (ref 150–400)
RBC: 3.89 MIL/uL (ref 3.87–5.11)
RDW: 14.8 % (ref 11.5–15.5)
WBC: 12.4 10*3/uL — ABNORMAL HIGH (ref 4.0–10.5)
nRBC: 0 % (ref 0.0–0.2)

## 2021-07-17 LAB — MAGNESIUM: Magnesium: 2.2 mg/dL (ref 1.7–2.4)

## 2021-07-17 MED ORDER — AMIODARONE IV BOLUS ONLY 150 MG/100ML
150.0000 mg | Freq: Once | INTRAVENOUS | Status: AC
  Administered 2021-07-17: 150 mg via INTRAVENOUS
  Filled 2021-07-17: qty 100

## 2021-07-17 MED ORDER — POTASSIUM CHLORIDE 10 MEQ/100ML IV SOLN
10.0000 meq | INTRAVENOUS | Status: AC
Start: 2021-07-17 — End: 2021-07-18
  Administered 2021-07-17 (×4): 10 meq via INTRAVENOUS
  Filled 2021-07-17 (×4): qty 100

## 2021-07-17 MED ORDER — METOPROLOL TARTRATE 5 MG/5ML IV SOLN
2.5000 mg | Freq: Four times a day (QID) | INTRAVENOUS | Status: DC
  Administered 2021-07-17 – 2021-07-20 (×10): 2.5 mg via INTRAVENOUS
  Filled 2021-07-17 (×12): qty 5

## 2021-07-17 NOTE — Progress Notes (Signed)
Tele reported pt in [place], HR 130s, Rapid called, MD paged, MD Barton Fanny at bedside. Per MD order RN gave scheduled lopressor 5 mg in 2 doses over 5 min. At this time, pt at baseline cognitively, resting comfortably, in no apparent distress BP 146/82, HR 100. Will continue to monitor.

## 2021-07-17 NOTE — Significant Event (Signed)
Rapid Response Event Note   Reason for Call : Vtach  Initial Focused Assessment: Called for central tele notifying RN of Vtach in 130s. On arrival, HR in 130s, patient responds to pain and has a strong femoral pulse. Zoll pads placed and Zoll turned on. HR slowed down to 110s and p waves were present. 12 Lead EKG done and Dr. Algie Coffer and Dr. Rhona Leavens arrived at bedside. Dr. Algie Coffer personally reviewed 12 lead.  Per cards MD, pt has a known NSTEMI which is unchanged today. Not candidate for any intervention due to ischemic stroke on admission. Skin is warm and dry. Lungs are diminished throughout. Current LOC of lethargy and confusion has been baseline since presenting with stroke. Able to state her name.   VS: BP 160/77, HR 98, RR 30, O2 98% 2L [place] VS after Metoprolol: BP 151/72, RR 36, HR 101, O2 100% 2L   Interventions:  -Total of 5mg  Metoprolol given IV (2.5mg  IV x2) -12 lead EKG  Plan of Care:  -Continue to give scheduled metoprolol 2.5mg  to maintain HR between 80-100s. -Monitor BP frequently before and after administration of Metoprolol.  Event Summary:   MD Notified: Dr. and Dr. Rhona Leavens at bedside  Call Time: [phone] Arrival Time: 0840 End Time: 0923  0924, RN

## 2021-07-17 NOTE — Progress Notes (Signed)
Daily Progress Note   Patient Name: Latasha Norton       Date: 07/17/2021 DOB: 1925-04-23  Age: 87 y.o. MRN#: [id number] Attending Physician: Jerald Kief, MD Primary Care Physician: Patient, No Pcp Per (Inactive) Admit Date: 07-28-2021 Length of Stay: 2 days  Reason for Consultation/Follow-up: Establishing goals of care  HPI/Patient Profile:  87 y.o. female  with past medical history of chronic systolic CHF (EF 23-55% in 2021), CKD stage III, HTN, and hypothyroidism presenting to the emergency department on 07/28/21 with left-sided weakness and aphasia. Family reports she has a history of non-compliance with her BP medications. Family had a phone conversation with patient the prior evening and patient was speaking normally. The morning of 7/16, family went into her room and found patient on the floor with obvious left-sided weakness and unable to stand or speak. In the ED, Blood pressure significantly elevated 176/66, hypoxia stabilized on 5L. CT head showing large right-sided MCA acute infarct. MRI confirmed large evolving ischemic right MCA territory infarct with no areas of hemorrhage.   On 7/17, patient unfortunately suffered a NSTEMI (troponin greater than 9000). EF now 25-30%.  On 7/18 the patient developed VTach (rate 130s) and was treated with Metoprolol. Rate down to 100-105, apparent p-wave, wide complex with BBB. Her son Carla Drape was at bedside.  Current Medications: Scheduled Meds:    stroke: mapping our early stages of recovery book   Does not apply Once   aspirin  150 mg Rectal Daily   furosemide  20 mg Intravenous Daily   [START ON 07/19/2021] levothyroxine  75 mcg Intravenous Daily   metoprolol tartrate  2.5 mg Intravenous Q6H   sodium chloride flush  3 mL Intravenous Once    Continuous Infusions:  sodium chloride 50 mL/hr at 07/16/21 1304   cefTRIAXone (ROCEPHIN)  IV 1 g (07/17/21 0213)   potassium chloride 10 mEq (07/17/21 1439)    PRN Meds: acetaminophen **OR**  acetaminophen (TYLENOL) oral liquid 160 mg/5 mL **OR** acetaminophen, acetaminophen, labetalol, senna-docusate  Subjective:   Subjective: Chart Reviewed. Updates received. Patient Assessed. Created space and opportunity for patient  and family to explore thoughts and feelings regarding current medical situation.  Today's Discussion: The patient is not able to interact meaningfully.  I spoke with her brother about the events that occur that morning and how that reaffirms the idea that the patient is very sick.  Concern for CODE BLUE is high, code cart outside the door.  I also spoke with her daughter Britta Mccreedy over the phone.  I relayed the information from what it occurred that morning and what this means for her prognosis overall.  Given that she has all these complicated health problems currently, this significantly decreases her likelihood of a meaningful recovery.  The patient states that she knows the need to make decisions, but she wants to wait until her other brother arrives from [place] likely tomorrow or in 2 days.  I agree that I will keep her updated in the interim.  I also relayed the very real possibility that if the patient codes, there is a high likelihood it would not be successful and that I wanted her to be aware of that to prevent surprises.  She verbalized understanding.  Review of Systems  Unable to perform ROS: Other (s/p CVA, mostly unintelligible speech)  Objective:   Vital Signs: BP 139/84 (BP Location: Left Arm)   Pulse 95   Temp 99.4 F (37.4 C) (Axillary)   Resp (!) 28  Ht 5\' 6"  (1.676 m)   Wt 55.2 kg   SpO2 100%   BMI 19.64 kg/m  SpO2: SpO2: 100 % O2 Device: O2 Device: Room Air O2 Flow Rate: O2 Flow Rate (L/min): 2 L/min  Physical Exam: Physical Exam Constitutional:      General: She is sleeping. She is not in acute distress.    Appearance: Normal appearance. She is ill-appearing.  HENT:     Head: Normocephalic and atraumatic.  Cardiovascular:     Rate  and Rhythm: Tachycardia present.  Pulmonary:     Effort: Pulmonary effort is normal. No respiratory distress.     Breath sounds: Normal breath sounds.  Abdominal:     General: Abdomen is flat.     Palpations: Abdomen is soft.  Skin:    General: Skin is warm and dry.  Neurological:     Comments: Unintelligible speech, unsure of cognition    SpO2: SpO2: 100 % O2 Device:SpO2: 100 % O2 Flow Rate: .O2 Flow Rate (L/min): 2 L/min  IO: Intake/output summary:  Intake/Output Summary (Last 24 hours) at 07/17/2021 1538 Last data filed at 07/17/2021 0300 Gross per 24 hour  Intake --  Output 750 ml  Net -750 ml    LBM: Last BM Date: 07/16/21 Baseline Weight: Weight: 55.2 kg Most recent weight: Weight: 55.2 kg   Palliative Assessment/Data: 10%   Assessment & Plan:   Impression: Very sick 87 year old female status post large right MCA CVA, subsequent MI, acute on chronic heart failure now with EF 25 to 30%, status post V. tach (with pulse) this morning.  No known advanced directives, the patient is not married but has 3 children which are shared decision makers.  Initially the decision was made for full scope, full code.  However, with the cardiac arrhythmias this morning attempted to reach out to further address goals of care.  The patient's daughter is asking for 1 to 2 days for her other brother to arrive from 85.  We discussed the very real situation of the patient could pass away before then.  SUMMARY OF RECOMMENDATIONS   Continue full scope Continue full code Family meeting when brother arrives from [facility]  Code Status: Full code  Goals of Care/Recommendations: Full scope treatment  Symptom Management: Per primary team We are available to assist with symptoms management as needed  Prognosis: < 2 weeks  Discharge Planning: To Be Determined  Discussed with: Patient's son [facility], daughter Carla Drape, nursing staff, rapid response RN.  Thank you for allowing Britta Mccreedy to participate  in the care of KITTIE KRIZAN PMT will continue to support holistically.  Time Total: 35 min  Visit consisted of counseling and education dealing with the complex and emotionally intense issues of symptom management and palliative care in the setting of serious and potentially life-threatening illness. Greater than 50%  of this time was spent counseling and coordinating care related to the above assessment and plan.  [name], NP Palliative Medicine Team  Team Phone # [phone] (Nights/Weekends)  07/17/2021, 3:38 PM   **ADDENDUM: We received a call about 3:00 or 3:15 PM that the patient's family was "all there" and they were under the impression that there is a meeting scheduled for 3:00.  Unsure if this was scheduled by another provider.  However, the palliative care nurse went to see the family and explained to them that we were under the impression that we were waiting for her brother to arrive from 07/19/2021.  We did schedule a family meeting  for tomorrow at 3 PM.**

## 2021-07-17 NOTE — Plan of Care (Signed)
  Problem: Pain Managment: Goal: General experience of comfort will improve Outcome: Progressing   Problem: Safety: Goal: Ability to remain free from injury will improve Outcome: Progressing   Problem: Skin Integrity: Goal: Risk for impaired skin integrity will decrease Outcome: Progressing   

## 2021-07-17 NOTE — Consult Note (Signed)
Ref: Patient, No Pcp Per (Inactive)   Subjective:  Awakens at times. Left sided neglect continues. HR in 120's/min. Improved HR with IV metoprolol 5 mg. Without drop in BP.  Objective:  Vital Signs in the last 24 hours: Temp:  [98.8 F (37.1 C)-101 F (38.3 C)] 100.1 F (37.8 C) (07/18 0735) Pulse Rate:  [87-104] 104 (07/18 0735) Cardiac Rhythm: Sinus tachycardia (07/17 1900) Resp:  [21-33] 22 (07/18 0735) BP: (142-168)/(63-85) 160/70 (07/18 0735) SpO2:  [97 %-100 %] 100 % (07/18 0735)  Physical Exam: BP Readings from Last 1 Encounters:  07/17/21 (!) 160/70     Wt Readings from Last 1 Encounters:  07/07/2021 55.2 kg    Weight change:  Body mass index is 19.64 kg/m. HEENT: [place]/AT, Eyes-Brown, Conjunctiva-Pink, Sclera-Non-icteric Neck: No JVD, No bruit, Trachea midline. Lungs:  Clearing Bilateral. Cardiac:  Irregular rhythm, normal S1 and S2, no S3. II/VI systolic murmur. Abdomen:  Soft, non-tender. BS present. Extremities:  No edema present. No cyanosis. No clubbing. CNS: AxOx0, Left upper and lower extremities are flaccid.  Skin: Warm and dry.   Intake/Output from previous day: 07/17 0701 - 07/18 0700 In: -  Out: 750 [Urine:750]    Lab Results: BMET    Component Value Date/Time   NA 147 (H) 07/17/2021 0118   NA 143 07/16/2021 0259   NA 143 07/27/2021 1249   K 3.7 07/17/2021 0118   K 4.4 07/16/2021 0259   K 3.6 07/16/2021 1249   CL 117 (H) 07/17/2021 0118   CL 111 07/16/2021 0259   CL 108 07/26/2021 1249   CO2 21 (L) 07/17/2021 0118   CO2 21 (L) 07/16/2021 0259   CO2 22 07/06/2021 1249   GLUCOSE 106 (H) 07/17/2021 0118   GLUCOSE 136 (H) 07/16/2021 0259   GLUCOSE 173 (H) 06/30/2021 1249   BUN 30 (H) 07/17/2021 0118   BUN 31 (H) 07/16/2021 0259   BUN 26 (H) 07/14/2021 1249   CREATININE 1.39 (H) 07/17/2021 0118   CREATININE 1.40 (H) 07/16/2021 0259   CREATININE 1.32 (H) 07/29/2021 1249   CALCIUM 8.9 07/17/2021 0118   CALCIUM 9.3 07/16/2021 0259    CALCIUM 9.4 07/03/2021 1249   GFRNONAA 35 (L) 07/17/2021 0118   GFRNONAA 35 (L) 07/16/2021 0259   GFRNONAA 38 (L) 07/29/2021 1249   GFRAA 41 (L) 05/06/2020 0916   GFRAA 30 (L) 04/13/2020 0407   GFRAA 27 (L) 04/11/2020 0338   CBC    Component Value Date/Time   WBC 12.4 (H) 07/17/2021 0118   RBC 3.89 07/17/2021 0118   HGB 11.0 (L) 07/17/2021 0118   HCT 35.5 (L) 07/17/2021 0118   PLT 319 07/17/2021 0118   MCV 91.3 07/17/2021 0118   MCH 28.3 07/17/2021 0118   MCHC 31.0 07/17/2021 0118   RDW 14.8 07/17/2021 0118   LYMPHSABS 0.7 07/16/2021 0259   MONOABS 0.8 07/16/2021 0259   EOSABS 0.0 07/16/2021 0259   BASOSABS 0.0 07/16/2021 0259   HEPATIC Function Panel Recent Labs    07/23/2021 1249 07/17/21 0118  PROT 8.5* 7.1   HEMOGLOBIN A1C No components found for: HGA1C,  MPG CARDIAC ENZYMES Lab Results  Component Value Date   CKTOTAL 1,118 (H) 07/16/2021   BNP No results for input(s): PROBNP in the last 8760 hours. TSH No results for input(s): TSH in the last 8760 hours. CHOLESTEROL Recent Labs    07/19/2021 1249 07/16/21 0259  CHOL 158 164    Scheduled Meds:   stroke: mapping our early stages of recovery  book   Does not apply Once   aspirin  150 mg Rectal Daily   furosemide  20 mg Intravenous Daily   [START ON 07/19/2021] levothyroxine  75 mcg Intravenous Daily   metoprolol tartrate  2.5 mg Intravenous Q6H   sodium chloride flush  3 mL Intravenous Once   Continuous Infusions:  sodium chloride 50 mL/hr at 07/16/21 1304   azithromycin 500 mg (07/17/21 0214)   cefTRIAXone (ROCEPHIN)  IV 1 g (07/17/21 0213)   PRN Meds:.acetaminophen **OR** acetaminophen (TYLENOL) oral liquid 160 mg/5 mL **OR** acetaminophen, acetaminophen, labetalol, senna-docusate  Assessment/Plan:  Chronic systolic left heart failure NSTEMI LBBB HTN Hypothyroidism Acute right MCA ischemic stroke.  Plan:  IV metoprolol as needed for HR control. She is not a candidate for cardiac intervention  at this time.   LOS: 2 days   Time spent including chart review, lab review, examination, discussion with patient/Nurse/referring doctor : 40 min   Orpah Cobb  MD  07/17/2021, 9:03 AM

## 2021-07-17 NOTE — Progress Notes (Signed)
PROGRESS NOTE    Latasha Norton  BMW:[id number] DOB: [birth year] DOA: 07/18/2021 PCP: Patient, No Pcp Per (Inactive)    Brief Narrative:  87 y.o. female with medical history significant of chronic systolic CHF, LVEF 45 to 50% 2021, CKD stage III, HTN, hypothyroidism, presented with new onset of left-sided weakness, left-sided neglect and speech problem. Pt was found to have large R sided CVA and NSTEMI.  Assessment & Plan:   Active Problems:   Stroke (cerebrum) (HCC)   Acute ischemic stroke (HCC)  Acute aphasia, right 3rd nerve palsy, right-sided facial droop and tongue deviation, and left-sided paresis -Secondary to large right MCA stroke -Given the size of stroke > one third of MCA territory, hemorrhagic transformation risk significantly high, hold off chemical DVT prophylaxis for now.  SCD for DVT prophylaxis. -Neurology had earlier recommended permissive htn over 5-7 days -Aspirin suppository ordered -did not pass swallow eval, remains NPO -Check A1c of 6.2 -Lipid panel of 82 -Appreciate input by Neurology. Per Neuro, patient is unlikely to survive this event and make meaningful recovery given her advanced age. Recommendation for full palliative -Will f/u with Palliative Care   Acute hypoxic respite failure -Given the nature of bilateral crackles, and strong history of noncompliant with CHF/BP medications, continued on Lasix to 20 mg IV daily -Attempt to wean O2 as tolerated   Pnemonia with sepsis present on admission -CXR reviewed, suggestive of pna. Pt with leukocytosis, fevers, tachypnea -Pt was continued on azithromycin and rocephin. Have discontinued azithromycin given concerns of developing VT in light of NSTEMI   HTN, uncontrolled -Likely from noncompliant with BP meds -Currently continued on permissive HTN -On low dose metoprolol for NSTEMI   CKD stage III -Signs of overload, cont lasix per above   Elevated Glucose -a1c of 6.2  NSTEMI -trop peaking to 9792 at  time of presentation, tropoinin is slightly trending down -Have consulted Cardiology, appreciate recs. Recommendation for trial of low dose metoprolol -Appreciate input by Palliative Care  VT -New event noted with Rapid Response being called -Likely in setting of above NSTEMI -Pt was seen at bedside with cardiology -Continued on metoprolol -After discussion with Cardiology, have given one dose of amiodarone  IV x1 -Palliative Care meeting in progress   DVT prophylaxis: SCD's Code Status: Full Family Communication: Pt in room, family is at bedside  Status is: Inpatient  Remains inpatient appropriate because:Inpatient level of care appropriate due to severity of illness  Dispo: The patient is from: Home              Anticipated d/c is to: SNF              Patient currently is not medically stable to d/c.   Difficult to place patient No       Consultants:  Neurology Cardiology Palliative Care  Procedures:    Antimicrobials: Anti-infectives (From admission, onward)    Start     Dose/Rate Route Frequency Ordered Stop   07/16/21 0215  cefTRIAXone (ROCEPHIN) 1 g in sodium chloride 0.9 % 100 mL IVPB        1 g 200 mL/hr over 30 Minutes Intravenous Every 24 hours 07/16/21 0118     07/16/21 0215  azithromycin (ZITHROMAX) 500 mg in sodium chloride 0.9 % 250 mL IVPB  Status:  Discontinued        500 mg 250 mL/hr over 60 Minutes Intravenous Every 24 hours 07/16/21 0118 07/17/21 1141       Subjective: Cannot obtain given mentation  Objective: Vitals:   07/17/21 0913 07/17/21 1010 07/17/21 1200 07/17/21 1301  BP: (!) 151/72 (!) 149/70 (!) 142/71 139/84  Pulse: (!) 101 (!) 103 99 95  Resp: (!) 36 (!) 31 (!) 27 (!) 28  Temp:  98.8 F (37.1 C) 99.4 F (37.4 C) 99.4 F (37.4 C)  TempSrc:  Axillary Axillary Axillary  SpO2: 100% 100%  100%  Weight:      Height:        Intake/Output Summary (Last 24 hours) at 07/17/2021 1439 Last data filed at 07/17/2021  0300 Gross per 24 hour  Intake --  Output 750 ml  Net -750 ml    Filed Weights   07/07/2021 1250  Weight: 55.2 kg    Examination: General exam: Awake, laying in bed, in nad Respiratory system: Normal respiratory effort, no wheezing Cardiovascular system: regular rate, s1, s2 Gastrointestinal system: Soft, nondistended, positive BS Central nervous system: CN2-12 grossly intact, strength intact Extremities: Perfused, no clubbing Skin: Normal skin turgor, no notable skin lesions seen Psychiatry: Mood normal // no visual hallucinations   Data Reviewed: I have personally reviewed following labs and imaging studies  CBC: Recent Labs  Lab 07/30/2021 1248 07/27/2021 1249 07/16/21 0259 07/17/21 0118  WBC  --  14.2* 11.9* 12.4*  NEUTROABS  --  13.4* 10.4*  --   HGB 13.3 12.3 12.5 11.0*  HCT 39.0 39.5 38.7 35.5*  MCV  --  93.2 89.6 91.3  PLT  --  381 386 319    Basic Metabolic Panel: Recent Labs  Lab 07/14/2021 1248 07/17/2021 1249 07/16/21 0259 07/17/21 0118  NA 144 143 143 147*  K 3.6 3.6 4.4 3.7  CL 112* 108 111 117*  CO2  --  22 21* 21*  GLUCOSE 162* 173* 136* 106*  BUN 29* 26* 31* 30*  CREATININE 1.10* 1.32* 1.40* 1.39*  CALCIUM  --  9.4 9.3 8.9  MG  --   --   --  2.2    GFR: Estimated Creatinine Clearance: 22 mL/min (A) (by C-G formula based on SCr of 1.39 mg/dL (H)). Liver Function Tests: Recent Labs  Lab 06/30/2021 1249 07/17/21 0118  AST 55* 114*  ALT 26 32  ALKPHOS 47 41  BILITOT 0.3 0.6  PROT 8.5* 7.1  ALBUMIN 3.2* 2.6*    No results for input(s): LIPASE, AMYLASE in the last 168 hours. No results for input(s): AMMONIA in the last 168 hours. Coagulation Profile: Recent Labs  Lab 07/18/2021 1249  INR 1.1    Cardiac Enzymes: Recent Labs  Lab 06/30/2021 2059 07/16/21 0259  CKTOTAL 1,012* 1,118*    BNP (last 3 results) No results for input(s): PROBNP in the last 8760 hours. HbA1C: Recent Labs    07/11/2021 1249 07/16/21 0259  HGBA1C 6.2* 6.2*     CBG: Recent Labs  Lab 07/01/2021 1239  GLUCAP 153*    Lipid Profile: Recent Labs    07/11/2021 1249 07/16/21 0259  CHOL 158 164  HDL 61 64  LDLCALC 82 87  TRIG 73 67  CHOLHDL 2.6 2.6    Thyroid Function Tests: No results for input(s): TSH, T4TOTAL, FREET4, T3FREE, THYROIDAB in the last 72 hours. Anemia Panel: No results for input(s): VITAMINB12, FOLATE, FERRITIN, TIBC, IRON, RETICCTPCT in the last 72 hours. Sepsis Labs: No results for input(s): PROCALCITON, LATICACIDVEN in the last 168 hours.  Recent Results (from the past 240 hour(s))  SARS CORONAVIRUS 2 (TAT 6-24 HRS) Nasopharyngeal Nasopharyngeal Swab     Status: None  Collection Time: 08/07/2021  2:03 PM   Specimen: Nasopharyngeal Swab  Result Value Ref Range Status   SARS Coronavirus 2 NEGATIVE NEGATIVE Final    Comment: (NOTE) SARS-CoV-2 target nucleic acids are NOT DETECTED.  The SARS-CoV-2 RNA is generally detectable in upper and lower respiratory specimens during the acute phase of infection. Negative results do not preclude SARS-CoV-2 infection, do not rule out co-infections with other pathogens, and should not be used as the sole basis for treatment or other patient management decisions. Negative results must be combined with clinical observations, patient history, and epidemiological information. The expected result is Negative.  Fact Sheet for Patients: HairSlick.no  Fact Sheet for Healthcare Providers: quierodirigir.com  This test is not yet approved or cleared by the [place] FDA and  has been authorized for detection and/or diagnosis of SARS-CoV-2 by FDA under an Emergency Use Authorization (EUA). This EUA will remain  in effect (meaning this test can be used) for the duration of the COVID-19 declaration under Se ction 564(b)(1) of the Act, 21 U.S.C. section 360bbb-3(b)(1), unless the authorization is terminated or revoked  sooner.  Performed at [facility], [address] [place], [place] [address]   Culture, blood (routine x 2)     Status: None (Preliminary result)   Collection Time: 07/16/21  4:30 PM   Specimen: BLOOD LEFT HAND  Result Value Ref Range Status   Specimen Description BLOOD LEFT HAND  Final   Special Requests   Final    BOTTLES DRAWN AEROBIC AND ANAEROBIC Blood Culture adequate volume   Culture   Final    NO GROWTH < 24 HOURS Performed at [facility], [address], [place], [place] [address]    Report Status PENDING  Incomplete  Culture, blood (routine x 2)     Status: None (Preliminary result)   Collection Time: 07/16/21  4:37 PM   Specimen: BLOOD RIGHT ARM  Result Value Ref Range Status   Specimen Description BLOOD RIGHT ARM  Final   Special Requests   Final    BOTTLES DRAWN AEROBIC ONLY Blood Culture adequate volume   Culture   Final    NO GROWTH < 24 HOURS Performed at [facility], [address], [place], [place] [address]    Report Status PENDING  Incomplete      Radiology Studies: MR ANGIO HEAD WO CONTRAST  Result Date: 2021-08-07 CLINICAL DATA:  Initial evaluation for acute stroke. EXAM: MRI HEAD WITHOUT CONTRAST MRA HEAD WITHOUT CONTRAST MRA NECK WITHOUT CONTRAST TECHNIQUE: Multiplanar, multiecho pulse sequences of the brain and surrounding structures were obtained without intravenous contrast. Angiographic images of the Circle of Willis were obtained using MRA technique without intravenous contrast. Angiographic images of the neck were obtained using MRA technique without intravenous contrast. Carotid stenosis measurements (when applicable) are obtained utilizing NASCET criteria, using the distal internal carotid diameter as the denominator. COMPARISON:  Prior CT from earlier the same day. FINDINGS: MRI HEAD FINDINGS Brain: Examination degraded by motion artifact. Generalized age-related cerebral volume loss with mild chronic small vessel ischemic  disease. Large confluent area of restricted diffusion involving the majority of the right MCA distribution is seen, consistent with a large evolving right MCA territory infarct. Involvement of the right caudate and lentiform nuclei noted. Associated gyral swelling and edema without significant regional mass or midline shift at this time. No associated hemorrhage visible on this motion degraded exam. No other evidence for acute or subacute ischemia. Gray-white matter differentiation  otherwise maintained. No encephalomalacia to suggest chronic cortical infarction elsewhere within the brain. No other evidence for acute or chronic intracranial hemorrhage. No mass lesion or midline shift. No hydrocephalus or extra-axial fluid collection. Pituitary gland suprasellar region within normal limits. Midline structures intact. Vascular: Diminished flow void seen within the right MCA distribution. Major intracranial vascular flow voids are otherwise maintained. Skull and upper cervical spine: Craniocervical junction within normal limits. Reversal of the normal cervical lordosis with associated mild diffuse spondylosis noted within the upper cervical spine. Bone marrow signal intensity normal. No scalp soft tissue abnormality. Sinuses/Orbits: Right gaze noted. Globes and orbital soft tissues demonstrate no other acute finding. Paranasal sinuses are largely clear. Trace bilateral mastoid effusions noted, of doubtful significance. Other: None. MRA HEAD FINDINGS ANTERIOR CIRCULATION: Examination degraded by motion. Visualized distal cervical segments of the internal carotid arteries are patent with antegrade flow. Petrous, cavernous, and supraclinoid segments patent without stenosis. A1 segments patent bilaterally. Normal anterior communicating artery complex. Anterior cerebral arteries patent to their distal aspects without stenosis. Left M1 patent. Normal left MCA bifurcation. Distal left MCA branches well perfused. There is acute  occlusion of the proximal right M1 segment. Minimal scant flow within right MCA branches distally. POSTERIOR CIRCULATION: Both vertebral arteries patent to the vertebrobasilar junction without stenosis. Both PICA are patent. Basilar patent to its distal aspect without stenosis. Superior cerebral arteries patent bilaterally. Left PCA supplied via the basilar. Fetal type origin of the right PCA. Both PCAs grossly perfused to their distal aspects without visible stenosis. No visible aneurysm. MRA NECK FINDINGS AORTIC ARCH: Examination technically limited by motion and lack of IV contrast Partially visualized aortic arch within normal limits for caliber with normal branch pattern. No visible stenosis about the origin of the great vessels. RIGHT CAROTID SYSTEM: Right CCA poorly assessed due to motion, but is grossly patent to the bifurcation. No obvious significant stenosis about the right bifurcation. Right ICA grossly patent distally to the skull base without visible stenosis or occlusion. LEFT CAROTID SYSTEM: Left CCA poorly visualized, but is grossly patent to the bifurcation. No obvious stenosis about the left bifurcation. Left ICA grossly patent to the skull base without visible stenosis or occlusion. VERTEBRAL ARTERIES: Both vertebral arteries appear to arise from the subclavian arteries. Vertebral arteries poorly assessed proximally, but are grossly patent to the skull base without visible stenosis or occlusion. IMPRESSION: MRI HEAD IMPRESSION: 1. Large evolving acute ischemic right MCA territory infarct. No associated hemorrhage or significant regional mass effect at this time. 2. Underlying mild age-related cerebral volume loss with chronic small vessel ischemic disease. MRA HEAD IMPRESSION: 1. Motion degraded exam. 2. Acute occlusion of the proximal right M1 segment. 3. No other large vessel occlusion. No other visible hemodynamically significant or correctable stenosis. MRA NECK IMPRESSION: 1. Technically  limited exam due to motion and lack of IV contrast. 2. Gross patency of both carotid artery systems and vertebral arteries to the skull base. No visible stenosis or other vascular abnormality. Electronically Signed   By: Rise Mu M.D.   On: July 26, 2021 20:47   MR ANGIO NECK WO CONTRAST  Result Date: 2021/07/26 CLINICAL DATA:  Initial evaluation for acute stroke. EXAM: MRI HEAD WITHOUT CONTRAST MRA HEAD WITHOUT CONTRAST MRA NECK WITHOUT CONTRAST TECHNIQUE: Multiplanar, multiecho pulse sequences of the brain and surrounding structures were obtained without intravenous contrast. Angiographic images of the Circle of Willis were obtained using MRA technique without intravenous contrast. Angiographic images of the neck were obtained using MRA technique without  intravenous contrast. Carotid stenosis measurements (when applicable) are obtained utilizing NASCET criteria, using the distal internal carotid diameter as the denominator. COMPARISON:  Prior CT from earlier the same day. FINDINGS: MRI HEAD FINDINGS Brain: Examination degraded by motion artifact. Generalized age-related cerebral volume loss with mild chronic small vessel ischemic disease. Large confluent area of restricted diffusion involving the majority of the right MCA distribution is seen, consistent with a large evolving right MCA territory infarct. Involvement of the right caudate and lentiform nuclei noted. Associated gyral swelling and edema without significant regional mass or midline shift at this time. No associated hemorrhage visible on this motion degraded exam. No other evidence for acute or subacute ischemia. Gray-white matter differentiation otherwise maintained. No encephalomalacia to suggest chronic cortical infarction elsewhere within the brain. No other evidence for acute or chronic intracranial hemorrhage. No mass lesion or midline shift. No hydrocephalus or extra-axial fluid collection. Pituitary gland suprasellar region within  normal limits. Midline structures intact. Vascular: Diminished flow void seen within the right MCA distribution. Major intracranial vascular flow voids are otherwise maintained. Skull and upper cervical spine: Craniocervical junction within normal limits. Reversal of the normal cervical lordosis with associated mild diffuse spondylosis noted within the upper cervical spine. Bone marrow signal intensity normal. No scalp soft tissue abnormality. Sinuses/Orbits: Right gaze noted. Globes and orbital soft tissues demonstrate no other acute finding. Paranasal sinuses are largely clear. Trace bilateral mastoid effusions noted, of doubtful significance. Other: None. MRA HEAD FINDINGS ANTERIOR CIRCULATION: Examination degraded by motion. Visualized distal cervical segments of the internal carotid arteries are patent with antegrade flow. Petrous, cavernous, and supraclinoid segments patent without stenosis. A1 segments patent bilaterally. Normal anterior communicating artery complex. Anterior cerebral arteries patent to their distal aspects without stenosis. Left M1 patent. Normal left MCA bifurcation. Distal left MCA branches well perfused. There is acute occlusion of the proximal right M1 segment. Minimal scant flow within right MCA branches distally. POSTERIOR CIRCULATION: Both vertebral arteries patent to the vertebrobasilar junction without stenosis. Both PICA are patent. Basilar patent to its distal aspect without stenosis. Superior cerebral arteries patent bilaterally. Left PCA supplied via the basilar. Fetal type origin of the right PCA. Both PCAs grossly perfused to their distal aspects without visible stenosis. No visible aneurysm. MRA NECK FINDINGS AORTIC ARCH: Examination technically limited by motion and lack of IV contrast Partially visualized aortic arch within normal limits for caliber with normal branch pattern. No visible stenosis about the origin of the great vessels. RIGHT CAROTID SYSTEM: Right CCA poorly  assessed due to motion, but is grossly patent to the bifurcation. No obvious significant stenosis about the right bifurcation. Right ICA grossly patent distally to the skull base without visible stenosis or occlusion. LEFT CAROTID SYSTEM: Left CCA poorly visualized, but is grossly patent to the bifurcation. No obvious stenosis about the left bifurcation. Left ICA grossly patent to the skull base without visible stenosis or occlusion. VERTEBRAL ARTERIES: Both vertebral arteries appear to arise from the subclavian arteries. Vertebral arteries poorly assessed proximally, but are grossly patent to the skull base without visible stenosis or occlusion. IMPRESSION: MRI HEAD IMPRESSION: 1. Large evolving acute ischemic right MCA territory infarct. No associated hemorrhage or significant regional mass effect at this time. 2. Underlying mild age-related cerebral volume loss with chronic small vessel ischemic disease. MRA HEAD IMPRESSION: 1. Motion degraded exam. 2. Acute occlusion of the proximal right M1 segment. 3. No other large vessel occlusion. No other visible hemodynamically significant or correctable stenosis. MRA NECK IMPRESSION: 1.  Technically limited exam due to motion and lack of IV contrast. 2. Gross patency of both carotid artery systems and vertebral arteries to the skull base. No visible stenosis or other vascular abnormality. Electronically Signed   By: Rise MuBenjamin  McClintock M.D.   On: 07/14/2021 20:47   MR BRAIN WO CONTRAST  Result Date: 07/09/2021 CLINICAL DATA:  Initial evaluation for acute stroke. EXAM: MRI HEAD WITHOUT CONTRAST MRA HEAD WITHOUT CONTRAST MRA NECK WITHOUT CONTRAST TECHNIQUE: Multiplanar, multiecho pulse sequences of the brain and surrounding structures were obtained without intravenous contrast. Angiographic images of the Circle of Willis were obtained using MRA technique without intravenous contrast. Angiographic images of the neck were obtained using MRA technique without intravenous  contrast. Carotid stenosis measurements (when applicable) are obtained utilizing NASCET criteria, using the distal internal carotid diameter as the denominator. COMPARISON:  Prior CT from earlier the same day. FINDINGS: MRI HEAD FINDINGS Brain: Examination degraded by motion artifact. Generalized age-related cerebral volume loss with mild chronic small vessel ischemic disease. Large confluent area of restricted diffusion involving the majority of the right MCA distribution is seen, consistent with a large evolving right MCA territory infarct. Involvement of the right caudate and lentiform nuclei noted. Associated gyral swelling and edema without significant regional mass or midline shift at this time. No associated hemorrhage visible on this motion degraded exam. No other evidence for acute or subacute ischemia. Gray-white matter differentiation otherwise maintained. No encephalomalacia to suggest chronic cortical infarction elsewhere within the brain. No other evidence for acute or chronic intracranial hemorrhage. No mass lesion or midline shift. No hydrocephalus or extra-axial fluid collection. Pituitary gland suprasellar region within normal limits. Midline structures intact. Vascular: Diminished flow void seen within the right MCA distribution. Major intracranial vascular flow voids are otherwise maintained. Skull and upper cervical spine: Craniocervical junction within normal limits. Reversal of the normal cervical lordosis with associated mild diffuse spondylosis noted within the upper cervical spine. Bone marrow signal intensity normal. No scalp soft tissue abnormality. Sinuses/Orbits: Right gaze noted. Globes and orbital soft tissues demonstrate no other acute finding. Paranasal sinuses are largely clear. Trace bilateral mastoid effusions noted, of doubtful significance. Other: None. MRA HEAD FINDINGS ANTERIOR CIRCULATION: Examination degraded by motion. Visualized distal cervical segments of the internal  carotid arteries are patent with antegrade flow. Petrous, cavernous, and supraclinoid segments patent without stenosis. A1 segments patent bilaterally. Normal anterior communicating artery complex. Anterior cerebral arteries patent to their distal aspects without stenosis. Left M1 patent. Normal left MCA bifurcation. Distal left MCA branches well perfused. There is acute occlusion of the proximal right M1 segment. Minimal scant flow within right MCA branches distally. POSTERIOR CIRCULATION: Both vertebral arteries patent to the vertebrobasilar junction without stenosis. Both PICA are patent. Basilar patent to its distal aspect without stenosis. Superior cerebral arteries patent bilaterally. Left PCA supplied via the basilar. Fetal type origin of the right PCA. Both PCAs grossly perfused to their distal aspects without visible stenosis. No visible aneurysm. MRA NECK FINDINGS AORTIC ARCH: Examination technically limited by motion and lack of IV contrast Partially visualized aortic arch within normal limits for caliber with normal branch pattern. No visible stenosis about the origin of the great vessels. RIGHT CAROTID SYSTEM: Right CCA poorly assessed due to motion, but is grossly patent to the bifurcation. No obvious significant stenosis about the right bifurcation. Right ICA grossly patent distally to the skull base without visible stenosis or occlusion. LEFT CAROTID SYSTEM: Left CCA poorly visualized, but is grossly patent to the bifurcation. No  obvious stenosis about the left bifurcation. Left ICA grossly patent to the skull base without visible stenosis or occlusion. VERTEBRAL ARTERIES: Both vertebral arteries appear to arise from the subclavian arteries. Vertebral arteries poorly assessed proximally, but are grossly patent to the skull base without visible stenosis or occlusion. IMPRESSION: MRI HEAD IMPRESSION: 1. Large evolving acute ischemic right MCA territory infarct. No associated hemorrhage or significant  regional mass effect at this time. 2. Underlying mild age-related cerebral volume loss with chronic small vessel ischemic disease. MRA HEAD IMPRESSION: 1. Motion degraded exam. 2. Acute occlusion of the proximal right M1 segment. 3. No other large vessel occlusion. No other visible hemodynamically significant or correctable stenosis. MRA NECK IMPRESSION: 1. Technically limited exam due to motion and lack of IV contrast. 2. Gross patency of both carotid artery systems and vertebral arteries to the skull base. No visible stenosis or other vascular abnormality. Electronically Signed   By: Rise Mu M.D.   On: 07/23/2021 20:47   DG CHEST PORT 1 VIEW  Result Date: 07/16/2021 CLINICAL DATA:  87 year old female with history of shortness of breath. Evaluate for pneumonia. EXAM: PORTABLE CHEST 1 VIEW COMPARISON:  Chest x-ray 07/23/2021 FINDINGS: Patchy ill-defined opacities and widespread areas of interstitial prominence are again noted in the lungs bilaterally, most severe in the left upper lobe. Aeration appears slightly improved compared to the recent prior study. Chronic elevation of the left hemidiaphragm. No pneumothorax. No evidence of pulmonary edema. Moderate cardiomegaly. The patient is rotated to the right on today's exam, resulting in distortion of the mediastinal contours and reduced diagnostic sensitivity and specificity for mediastinal pathology. Atherosclerotic calcifications in the thoracic aorta. IMPRESSION: 1. Improving aeration with what appears to be resolving multilobar bilateral pneumonia. 2. Moderate cardiomegaly. 3. Aortic atherosclerosis. Electronically Signed   By: Trudie Reed M.D.   On: 07/16/2021 10:17   DG Chest Port 1 View  Result Date: 07/19/2021 CLINICAL DATA:  Hypoxia. New onset aphasia and left-sided paralysis. EXAM: PORTABLE CHEST 1 VIEW COMPARISON:  One-view chest x-ray 05/06/2020 FINDINGS: Heart is enlarged. Atherosclerotic changes are noted within the aorta.  Asymmetric interstitial and airspace disease is present throughout the left lung. There is some increased opacity at the right base. Chronic elevation of left hemidiaphragm again noted. IMPRESSION: 1. Stable cardiomegaly. 2. Asymmetric interstitial and airspace disease throughout the left lung concerning for pneumonia. This may represent asymmetric edema. 3. Increased opacity at the right base likely represents atelectasis. Electronically Signed   By: Marin Roberts M.D.   On: 07/07/2021 14:50   ECHOCARDIOGRAM COMPLETE  Result Date: 07/16/2021    ECHOCARDIOGRAM REPORT   Patient Name:   DILCIA RYBARCZYK Date of Exam: 07/16/2021 Medical Rec #:  [id number]      Height:       66.0 in Accession #:    [id number]     Weight:       121.7 lb Date of Birth:  Jan 11, 1925      BSA:          1.619 m Patient Age:    89 years       BP:           155/85 mmHg Patient Gender: F              HR:           102 bpm. Exam Location:  Inpatient Procedure: 2D Echo Indications:     stroke  History:         Patient has  prior history of Echocardiogram examinations, most                  recent 04/07/2020. Sick sinus syndrome, chronic kidney disease;                  Risk Factors:Hypertension.  Sonographer:     Delcie Roch Referring Phys:  [id number] Kara Mead Diagnosing Phys: Orpah Cobb MD  Sonographer Comments: Image acquisition challenging due to uncooperative patient and Image acquisition challenging due to respiratory motion. IMPRESSIONS  1. Left ventricular ejection fraction, by estimation, is 25 to 30%. The left ventricle has severely decreased function. The left ventricle demonstrates global hypokinesis. There is mild concentric left ventricular hypertrophy. Left ventricular diastolic  parameters are consistent with Grade I diastolic dysfunction (impaired relaxation). There is severe hypokinesis of the left ventricular, entire septal wall and inferior wall.  2. Right ventricular systolic function is normal. The right  ventricular size is normal. There is mildly elevated pulmonary artery systolic pressure.  3. Left atrial size was severely dilated.  4. Right atrial size was mildly dilated.  5. The mitral valve is degenerative. Moderate to severe mitral valve regurgitation. Moderate mitral annular calcification.  6. The aortic valve is tricuspid. There is mild calcification of the aortic valve. There is mild thickening of the aortic valve. Aortic valve regurgitation is moderate.  7. There is mild (Grade II) atheroma plaque involving the aortic root and ascending aorta.  8. The inferior vena cava is normal in size with <50% respiratory variability, suggesting right atrial pressure of 8 mmHg. FINDINGS  Left Ventricle: Left ventricular ejection fraction, by estimation, is 25 to 30%. The left ventricle has severely decreased function. The left ventricle demonstrates global hypokinesis. Severe hypokinesis of the left ventricular, entire septal wall and inferior wall. The left ventricular internal cavity size was normal in size. There is mild concentric left ventricular hypertrophy. Left ventricular diastolic parameters are consistent with Grade I diastolic dysfunction (impaired relaxation). Right Ventricle: The right ventricular size is normal. No increase in right ventricular wall thickness. Right ventricular systolic function is normal. There is mildly elevated pulmonary artery systolic pressure. The tricuspid regurgitant velocity is 2.96  m/s, and with an assumed right atrial pressure of 3 mmHg, the estimated right ventricular systolic pressure is 38.0 mmHg. Left Atrium: Left atrial size was severely dilated. Right Atrium: Right atrial size was mildly dilated. Pericardium: There is no evidence of pericardial effusion. Mitral Valve: The mitral valve is degenerative in appearance. There is moderate thickening of the mitral valve leaflet(s). There is moderate calcification of the mitral valve leaflet(s). Moderate mitral annular  calcification. Moderate to severe mitral valve regurgitation. Tricuspid Valve: The tricuspid valve is normal in structure. Tricuspid valve regurgitation is mild. Aortic Valve: The aortic valve is tricuspid. There is mild calcification of the aortic valve. There is mild thickening of the aortic valve. There is mild aortic valve annular calcification. Aortic valve regurgitation is moderate. Aortic regurgitation PHT  measures 283 msec. Pulmonic Valve: The pulmonic valve was normal in structure. Pulmonic valve regurgitation is not visualized. Aorta: The aortic root is normal in size and structure. There is mild (Grade II) atheroma plaque involving the aortic root and ascending aorta. Venous: The inferior vena cava is normal in size with less than 50% respiratory variability, suggesting right atrial pressure of 8 mmHg. IAS/Shunts: The atrial septum is grossly normal.  LEFT VENTRICLE PLAX 2D LVIDd:         4.70 cm LVIDs:  4.20 cm LV PW:         1.10 cm LV IVS:        1.20 cm LVOT diam:     1.80 cm LV SV:         29 LV SV Index:   18 LVOT Area:     2.54 cm  RIGHT VENTRICLE             IVC RV S prime:     11.10 cm/s  IVC diam: 1.50 cm TAPSE (M-mode): 1.8 cm LEFT ATRIUM             Index       RIGHT ATRIUM           Index LA diam:        4.50 cm 2.78 cm/m  RA Area:     12.80 cm LA Vol (A2C):   85.8 ml 52.99 ml/m RA Volume:   25.50 ml  15.75 ml/m LA Vol (A4C):   61.9 ml 38.23 ml/m LA Biplane Vol: 72.7 ml 44.90 ml/m  AORTIC VALVE LVOT Vmax:   90.00 cm/s LVOT Vmean:  54.500 cm/s LVOT VTI:    0.112 m AI PHT:      283 msec  AORTA Ao Root diam: 2.80 cm MR Peak grad: 106.1 mmHg  TRICUSPID VALVE MR Mean grad: 56.0 mmHg   TR Peak grad:   35.0 mmHg MR Vmax:      515.00 cm/s TR Vmax:        296.00 cm/s MR Vmean:     344.0 cm/s                           SHUNTS                           Systemic VTI:  0.11 m                           Systemic Diam: 1.80 cm Orpah Cobb MD Electronically signed by Orpah Cobb MD Signature  Date/Time: 07/16/2021/3:43:21 PM    Final    VAS US CAROTID  Result Date: 07/16/2021 Carotid Arterial Duplex Study Patient Name:  [name]  Date of Exam:   07/16/2021 Medical Rec #: [id number]       Accession #:    [id number] Date of Birth: November 29, 1925       Patient Gender: F Patient Age:   89Y Exam Location:  [facility] Procedure:      VAS US CAROTID Referring Phys: [id number] Garner Gavel --------------------------------------------------------------------------------  Indications:       CVA. Limitations        Today's exam was limited due to Patient somnolence, inability                    to reposition, anatomy/tortuosity of vessels. Comparison Study:  July 25, 2021 MRA Neck suboptimal visualization of bilateral                    carotids. Performing Technologist: Jean Rosenthal RDMS,RVT  Examination Guidelines: A complete evaluation includes B-mode imaging, spectral Doppler, color Doppler, and power Doppler as needed of all accessible portions of each vessel. Bilateral testing is considered an integral part of a complete examination. Limited examinations for reoccurring indications may be performed as noted.  Right Carotid Findings: +----------+--------+--------+--------+------------------+--------+  PSV cm/sEDV cm/sStenosisPlaque DescriptionComments +----------+--------+--------+--------+------------------+--------+ CCA Prox  45      10                                tortuous +----------+--------+--------+--------+------------------+--------+ CCA Mid                                             tortuous +----------+--------+--------+--------+------------------+--------+ CCA Distal55      8                                          +----------+--------+--------+--------+------------------+--------+ ICA Prox  58      15      1-39%   heterogenous      tortuous +----------+--------+--------+--------+------------------+--------+ ICA Distal31      9                                           +----------+--------+--------+--------+------------------+--------+ ECA       73                                                 +----------+--------+--------+--------+------------------+--------+ +----------+--------+-------+----------------+-------------------+           PSV cm/sEDV cmsDescribe        Arm Pressure (mmHG) +----------+--------+-------+----------------+-------------------+ [id number]             Multiphasic, WNL                    +----------+--------+-------+----------------+-------------------+ +---------+--------+--+--------+-+---------+ VertebralPSV cm/s30EDV cm/s7Antegrade +---------+--------+--+--------+-+---------+  Left Carotid Findings: +----------+--------+--------+--------+------------------+--------+           PSV cm/sEDV cm/sStenosisPlaque DescriptionComments +----------+--------+--------+--------+------------------+--------+ CCA Prox  93      11                                         +----------+--------+--------+--------+------------------+--------+ CCA Distal76      13                                         +----------+--------+--------+--------+------------------+--------+ ICA Prox  59      17      1-39%   heterogenous               +----------+--------+--------+--------+------------------+--------+ ICA Distal47      13                                         +----------+--------+--------+--------+------------------+--------+ ECA       59                                                 +----------+--------+--------+--------+------------------+--------+ +----------+--------+--------+----------------+-------------------+  PSV cm/sEDV cm/sDescribe        Arm Pressure (mmHG) +----------+--------+--------+----------------+-------------------+ [id number]             Multiphasic, WNL                     +----------+--------+--------+----------------+-------------------+ +---------+--------+--+--------+--+---------+ VertebralPSV cm/s59EDV cm/s14Antegrade +---------+--------+--+--------+--+---------+   Summary: Right Carotid: Velocities in the right ICA are consistent with a 1-39% stenosis. Left Carotid: Velocities in the left ICA are consistent with a 1-39% stenosis. Vertebrals:  Bilateral vertebral arteries demonstrate antegrade flow. Subclavians: Normal flow hemodynamics were seen in bilateral subclavian              arteries. *See table(s) above for measurements and observations.     Preliminary     Scheduled Meds:   stroke: mapping our early stages of recovery book   Does not apply Once   aspirin  150 mg Rectal Daily   furosemide  20 mg Intravenous Daily   [START ON 07/19/2021] levothyroxine  75 mcg Intravenous Daily   metoprolol tartrate  2.5 mg Intravenous Q6H   sodium chloride flush  3 mL Intravenous Once   Continuous Infusions:  sodium chloride 50 mL/hr at 07/16/21 1304   cefTRIAXone (ROCEPHIN)  IV 1 g (07/17/21 0213)   potassium chloride 10 mEq (07/17/21 1439)     LOS: 2 days   Rickey Barbara, MD [facility] Pager On Amion  If 7PM-7AM, please contact night-coverage 07/17/2021, 2:39 PM

## 2021-07-17 NOTE — Plan of Care (Signed)

## 2021-07-17 NOTE — Progress Notes (Signed)
STROKE TEAM PROGRESS NOTE   INTERVAL HISTORY Patient`s son is at the bedside.  Her neurological condition remains unchanged she is drowsy but arousable with severe dysarthria and dense left hemiplegia and right gaze deviation.  I had a long discussion with son at the bedside about goals of care and discuss need to make decisions about DNR and palliative care.  Palliative care team has been consulted as well  Vitals:   07/17/21 0913 07/17/21 1010 07/17/21 1200 07/17/21 1301  BP: (!) 151/72 (!) 149/70 (!) 142/71 139/84  Pulse: (!) 101 (!) 103 99 95  Resp: (!) 36 (!) 31 (!) 27 (!) 28  Temp:  98.8 F (37.1 C) 99.4 F (37.4 C) 99.4 F (37.4 C)  TempSrc:  Axillary Axillary Axillary  SpO2: 100% 100%  100%  Weight:      Height:       CBC:  Recent Labs  Lab 2021-07-18 1249 07/16/21 0259 07/17/21 0118  WBC 14.2* 11.9* 12.4*  NEUTROABS 13.4* 10.4*  --   HGB 12.3 12.5 11.0*  HCT 39.5 38.7 35.5*  MCV 93.2 89.6 91.3  PLT 381 386 319   Basic Metabolic Panel:  Recent Labs  Lab 07/16/21 0259 07/17/21 0118  NA 143 147*  K 4.4 3.7  CL 111 117*  CO2 21* 21*  GLUCOSE 136* 106*  BUN 31* 30*  CREATININE 1.40* 1.39*  CALCIUM 9.3 8.9  MG  --  2.2   Lipid Panel:  Recent Labs  Lab 07/16/21 0259  CHOL 164  TRIG 67  HDL 64  CHOLHDL 2.6  VLDL 13  LDLCALC 87   HgbA1c:  Recent Labs  Lab 07/16/21 0259  HGBA1C 6.2*   Urine Drug Screen: No results for input(s): LABOPIA, COCAINSCRNUR, LABBENZ, AMPHETMU, THCU, LABBARB in the last 168 hours.  Alcohol Level No results for input(s): ETH in the last 168 hours.  IMAGING past 24 hours MR ANGIO HEAD WO CONTRAST  Result Date: 2021/07/18 IMPRESSION: 1. Technically limited exam due to motion and lack of IV contrast. 2. Gross patency of both carotid artery systems and vertebral arteries to the skull base. No visible stenosis or other vascular abnormality.   MR ANGIO NECK WO CONTRAST  Result Date: 18-Jul-2021 MRA NECK IMPRESSION: 1. Technically  limited exam due to motion and lack of IV contrast. 2. Gross patency of both carotid artery systems and vertebral arteries to the skull base. No visible stenosis or other vascular abnormality. Electronically Signed   By: Rise Mu M.D.   On: July 18, 2021 20:47   MR BRAIN WO CONTRAST  Result Date: 18-Jul-2021 IMPRESSION:  1. Large evolving acute ischemic right MCA territory infarct. No associated hemorrhage or significant regional mass effect at this time. 2. Underlying mild age-related cerebral volume loss with chronic small vessel ischemic disease. D  Result Date: 07/16/2021 Chest x-ray IMPRESSION: 1. Improving aeration with what appears to be resolving multilobar bilateral pneumonia. 2. Moderate cardiomegaly. 3. Aortic atherosclerosis. Electronically Signed   By: Trudie Reed M.D.   On: 07/16/2021 10:17   ECHOCARDIOGRAM COMPLETE  Result Date: 07/16/2021    ECHOCARDIOGRAM REPORT   Patient Name:   Latasha Norton Date of Exam: IMPRESSIONS   1. Left ventricular ejection fraction, by estimation, is 25 to 30%. The left ventricle has severely decreased function. The left ventricle demonstrates global hypokinesis. There is mild concentric left ventricular hypertrophy. Left ventricular diastolic  parameters are consistent with Grade I diastolic dysfunction (impaired relaxation). There is severe hypokinesis of the left ventricular,  entire septal wall and inferior wall.   2. Right ventricular systolic function is normal. The right ventricular size is normal. There is mildly elevated pulmonary artery systolic pressure.   3. Left atrial size was severely dilated.   4. Right atrial size was mildly dilated.   5. The mitral valve is degenerative. Moderate to severe mitral valve regurgitation. Moderate mitral annular calcification.   6. The aortic valve is tricuspid. There is mild calcification of the aortic valve. There is mild thickening of the aortic valve. Aortic valve regurgitation is moderate.    7. There is mild (Grade II) atheroma plaque involving the aortic root and ascending aorta.   8. The inferior vena cava is normal in size with <50% respiratory variability, suggesting right atrial pressure of 8 mmHg.   VAS US CAROTID  Result Date: 07/16/2021 Summary: Right Carotid: Velocities in the right ICA are consistent with a 1-39% stenosis. Left Carotid: Velocities in the left ICA are consistent with a 1-39% stenosis. Vertebrals:  Bilateral vertebral arteries demonstrate antegrade flow. Subclavians: Normal flow hemodynamics were seen in bilateral subclavian              arteries. *See table(s) above for measurements and observations.     Preliminary     PHYSICAL EXAM Frail malnourished looking elderly African-American lady lying in bed in mild distress. . Afebrile. Head is nontraumatic. Neck is supple without bruit.    Cardiac exam no murmur or gallop. Lungs are clear to auscultation. Distal pulses are well felt.  Neurological Exam Patient is drowsy but arouses to stimuli.  She has right gaze deviation and is unable to look to the left.  She slightly blinks to threat on the right but not on the left.  She has severe dysarthria but can speak a few clear words and occasional short sentences but is difficult to understand.  Left lower facial weakness.  Tongue midline.  She has dense left hemiplegia with left upper extremity which is flaccid and hypotonia.  She does withdraw the left lower extremity to painful stimuli but not the left upper extremity.  She has purposeful right upper and lower extremity movements but does not follow commands consistently.  Gait not tested  ASSESSMENT/PLAN Latasha Norton is a 87 y.o. female with history of congestive heart failure, essential hypertension, sick sinus syndrome, and chronic kidney disease stage III who presented with left hemiparesis, forced right gaze, and aphasia.   Stroke:  right MCA stroke due to right M1 occlusion likely of cardiogenic etiology  from cardiomyopathy and low ejection fraction Code Stroke CT head: large territory acute infarct right MCA territory. No hemorrhage or midline shift. ASPECTS is 1 MRI  Large evolving acute ischemic right MCA territory infarct MRA  No visible stenosis or vascular abnormality Carotid Doppler  no significant stenosis 2D Echo: 25 to 30%. The left ventricle has severely decreased function. The left ventricle demonstrates global hypokinesis. There is mild concentric left ventricular hypertrophy. LDL 87 HgbA1c 6.2 VTE prophylaxis - SCDs    Diet   Diet NPO time specified  May need corpak for medication and tube feeds aspirin 81 mg daily prior to admission, now on aspirin 150mg suppository.  Therapy recommendations:  SNF Disposition:  pending  Hypertension Home meds:  amlodipine 5mg  daily, hydralazine 25mg  TID, HCTZ 12.5mg  daily Unstable Permissive hypertension (OK if < 220/120) but gradually normalize in 5-7 days Long-term BP goal normotensive  Hyperlipidemia Home meds:  none LDL 87, goal < 70 Add atorvastatin once NG  tube access obtained   Continue statin at discharge  Diabetes type II Controlled Home meds:  none HgbA1c 6.2, goal < 7.0 CBGs Recent Labs    07/04/2021 1239  GLUCAP 153*    SSI  Other Stroke Risk Factors Advanced Age >/= 43  Congestive heart failure  Other Active Problems Chronic kidney disease stage III Sick sinus syndrome Hypothyroidism: synthroid daily  Hospital day # 2  Patient unfortunately has presented with a large right middle cerebral artery infarct with involvement of almost the entire territory with NIH stroke scale of 26 and CT scan showing aspect score of 1.  She was independent prior to the stroke but unfortunately is unlikely to survive this and make meaningful improvement given her advanced age.  I had a long discussion with the patient's son at the bedside regarding her large stroke and significant resultant disability she is likely going  to need a feeding tube and prolonged stay in nursing home which she may not want.  Recommend not be aggressive and comfort care measures only.  Son states he will discuss this with his sister soon and make a decision..  Discussed with Dr. Rhona Leavens.  And palliative care nurse practitioner.  .  Greater than 50% time during this 25-minute visit was spent on counseling and coordination of care and discussion with care team about her large stroke and prognosis and answering questions.  Stroke team will sign off.  Kindly call for questions.  Latasha Heady, MD Medical Director [facility] Pager: [phone] 07/17/2021 1:49 PM  To contact Stroke Continuity provider, please refer to [email or web address]. After hours, contact General Neurology

## 2021-07-17 NOTE — Progress Notes (Signed)
  Speech Language Pathology Treatment: Dysphagia  Patient Details Name: Latasha Norton MRN: [id number] DOB: 11-Aug-1925 Today's Date: 07/17/2021 Time: 4010-2725 SLP Time Calculation (min) (ACUTE ONLY): 19 min  Assessment / Plan / Recommendation Clinical Impression  Pt was seen this afternoon with emphasis on potential readiness for POs and family education. Two granddaughters were present throughout session with additional granddaughters and daughter arriving at the end of the session and updated. Pt is lethargic, not opening her eyes and showing minimal signs of awareness across stimulation provided and attempts at PO trials. She does purse her lips at times against oral care and spoons. She does not show any increased signs of alertness or awareness when cued by her granddaughters. Questions at the end of the session seemed to focus on feeding tubes and if there would be different types of feeding tubes that might eliminate the risk of aspiration. Education was provided that this was not possible, in part because of the risk of aspirating secretions if she continues to not show signs of swallowing. Recommend that she remain NPO for now as family continues to make decisions regarding POC - note that family meeting is planned for this afternoon with palliative care.   HPI HPI: Pt is a 87 y/o female admitted secondary to L sided weakness, R gaze and difficulty with speech. MRI of the brain revealed an acute evolving R MCA infarct. PMH: chronic systolic CHF, LVEF 45 to 50% 2021, CKD stage III, HTN, hypothyroidism.      SLP Plan  Continue with current plan of care       Recommendations  Diet recommendations: NPO Medication Administration: Via alternative means                Oral Care Recommendations: Oral care QID Follow up Recommendations: Skilled Nursing facility;24 hour supervision/assistance SLP Visit Diagnosis: Dysphagia, unspecified (R13.10) Plan: Continue with current plan of  care       GO                Mahala Menghini., M.A. CCC-SLP Acute Rehabilitation Services Pager [phone] Office [phone]  07/17/2021, 3:08 PM

## 2021-07-17 NOTE — TOC CAGE-AID Note (Signed)
Transition of Care [facility]) - CAGE-AID Screening   Patient Details  Name: Latasha Norton MRN: [id number] Date of Birth: [birth year]  [facility] of [facility] CM/SW Contact:    Maitland Muhlbauer C Tarpley-Carter, LCSWA Phone Number: 07/17/2021, 10:08 AM   Clinical Narrative: Pt is unable to participate in Cage Aid. Pt does not meet criteria/inappropriate.  Ruberta Holck Tarpley-Carter, MSW, LCSW-A Pronouns:  She/Her/Hers                          [place] Clinical Social WorkerTransitions of Care Cell:  [phone] [email or web address]    CAGE-AID Screening: Substance Abuse Screening unable to be completed due to: : Patient unable to participate ((Pt does not meet criteria/inappropriate.))

## 2021-07-17 NOTE — Progress Notes (Signed)
This chaplain responded to PMT NP-EG referral for spiritual care.  The Pt. son-Curnie is at the bedside.  Curnie is appreciative of the chaplain visit, but has plans to leave the Pt. bedside soon.  The chaplain listens reflectively as Carla Drape begins sharing the Pt. story. The chaplain understands Carla Drape has family support as he lives the hospital.   The chaplain offers F/U spiritual care as needed.

## 2021-07-18 DIAGNOSIS — Z515 Encounter for palliative care: Secondary | ICD-10-CM | POA: Diagnosis not present

## 2021-07-18 DIAGNOSIS — I5041 Acute combined systolic (congestive) and diastolic (congestive) heart failure: Secondary | ICD-10-CM

## 2021-07-18 DIAGNOSIS — I639 Cerebral infarction, unspecified: Secondary | ICD-10-CM | POA: Diagnosis not present

## 2021-07-18 DIAGNOSIS — N179 Acute kidney failure, unspecified: Secondary | ICD-10-CM

## 2021-07-18 DIAGNOSIS — R0682 Tachypnea, not elsewhere classified: Secondary | ICD-10-CM | POA: Diagnosis not present

## 2021-07-18 DIAGNOSIS — Z7189 Other specified counseling: Secondary | ICD-10-CM | POA: Diagnosis not present

## 2021-07-18 DIAGNOSIS — R638 Other symptoms and signs concerning food and fluid intake: Secondary | ICD-10-CM

## 2021-07-18 LAB — COMPREHENSIVE METABOLIC PANEL
ALT: 37 U/L (ref 0–44)
AST: 91 U/L — ABNORMAL HIGH (ref 15–41)
Albumin: 2.6 g/dL — ABNORMAL LOW (ref 3.5–5.0)
Alkaline Phosphatase: 47 U/L (ref 38–126)
Anion gap: 8 (ref 5–15)
BUN: 50 mg/dL — ABNORMAL HIGH (ref 8–23)
CO2: 23 mmol/L (ref 22–32)
Calcium: 8.8 mg/dL — ABNORMAL LOW (ref 8.9–10.3)
Chloride: 119 mmol/L — ABNORMAL HIGH (ref 98–111)
Creatinine, Ser: 1.87 mg/dL — ABNORMAL HIGH (ref 0.44–1.00)
GFR, Estimated: 25 mL/min — ABNORMAL LOW (ref >60)
Glucose, Bld: 108 mg/dL — ABNORMAL HIGH (ref 70–99)
Potassium: 5.4 mmol/L — ABNORMAL HIGH (ref 3.5–5.1)
Sodium: 150 mmol/L — ABNORMAL HIGH (ref 135–145)
Total Bilirubin: 0.7 mg/dL (ref 0.3–1.2)
Total Protein: 7.7 g/dL (ref 6.5–8.1)

## 2021-07-18 LAB — MAGNESIUM: Magnesium: 2.5 mg/dL — ABNORMAL HIGH (ref 1.7–2.4)

## 2021-07-18 MED ORDER — ONDANSETRON 4 MG PO TBDP: 4.0000 mg | ORAL_TABLET | Freq: Four times a day (QID) | ORAL | Status: DC | PRN

## 2021-07-18 MED ORDER — HALOPERIDOL LACTATE 5 MG/ML IJ SOLN: 0.5000 mg | INTRAMUSCULAR | Status: DC | PRN

## 2021-07-18 MED ORDER — NITROGLYCERIN 0.3 MG/HR TD PT24: 0.3000 mg | MEDICATED_PATCH | Freq: Every day | TRANSDERMAL | Status: DC

## 2021-07-18 MED ORDER — HYDROMORPHONE HCL 1 MG/ML IJ SOLN
0.5000 mg | INTRAMUSCULAR | Status: DC | PRN
  Administered 2021-07-18 – 2021-07-20 (×4): 0.5 mg via INTRAVENOUS
  Filled 2021-07-18 (×4): qty 0.5

## 2021-07-18 MED ORDER — DEXTROSE-NACL 5-0.45 % IV SOLN: INTRAVENOUS | Status: DC

## 2021-07-18 MED ORDER — HALOPERIDOL LACTATE 2 MG/ML PO CONC
0.5000 mg | ORAL | Status: DC | PRN
  Filled 2021-07-18: qty 0.3

## 2021-07-18 MED ORDER — NITROGLYCERIN 2 % TD OINT
0.5000 [in_us] | TOPICAL_OINTMENT | Freq: Four times a day (QID) | TRANSDERMAL | Status: DC | PRN
  Filled 2021-07-18: qty 30

## 2021-07-18 MED ORDER — GLYCOPYRROLATE 0.2 MG/ML IJ SOLN: 0.2000 mg | INTRAMUSCULAR | Status: DC | PRN

## 2021-07-18 MED ORDER — POLYVINYL ALCOHOL 1.4 % OP SOLN
1.0000 [drp] | Freq: Four times a day (QID) | OPHTHALMIC | Status: DC | PRN
  Filled 2021-07-18: qty 15

## 2021-07-18 MED ORDER — MORPHINE SULFATE (PF) 2 MG/ML IV SOLN
1.0000 mg | INTRAVENOUS | Status: DC | PRN
Start: 2021-07-18 — End: 2021-07-18

## 2021-07-18 MED ORDER — HALOPERIDOL 0.5 MG PO TABS: 0.5000 mg | ORAL_TABLET | ORAL | Status: DC | PRN

## 2021-07-18 MED ORDER — BIOTENE DRY MOUTH MT LIQD: 15.0000 mL | OROMUCOSAL | Status: DC | PRN

## 2021-07-18 MED ORDER — ONDANSETRON HCL 4 MG/2ML IJ SOLN: 4.0000 mg | Freq: Four times a day (QID) | INTRAMUSCULAR | Status: DC | PRN

## 2021-07-18 MED ORDER — AMIODARONE IV BOLUS ONLY 150 MG/100ML
150.0000 mg | Freq: Once | INTRAVENOUS | Status: AC
  Administered 2021-07-18: 150 mg via INTRAVENOUS
  Filled 2021-07-18: qty 100

## 2021-07-18 MED ORDER — GLYCOPYRROLATE 1 MG PO TABS
1.0000 mg | ORAL_TABLET | ORAL | Status: DC | PRN
  Filled 2021-07-18: qty 1

## 2021-07-18 NOTE — Consult Note (Signed)
Ref: Patient, No Pcp Per (Inactive)   Subjective:  Mumbles on attempts to wake her up. HR in 100's.  Moniotr-SR + LBBB.  Objective:  Vital Signs in the last 24 hours: Temp:  [98.4 F (36.9 C)-99.6 F (37.6 C)] 99.6 F (37.6 C) (07/19 0755) Pulse Rate:  [49-104] 104 (07/19 0755) Cardiac Rhythm: Sinus tachycardia (07/19 0700) Resp:  [23-36] 26 (07/19 0755) BP: (132-160)/(62-84) 141/71 (07/19 0755) SpO2:  [96 %-100 %] 100 % (07/19 0755)  Physical Exam: BP Readings from Last 1 Encounters:  07/18/21 (!) 141/71     Wt Readings from Last 1 Encounters:  07/06/2021 55.2 kg    Weight change:  Body mass index is 19.64 kg/m. HEENT: Marks/AT, Eyes-Brown, Conjunctiva-Pink, Sclera-Non-icteric Neck: No JVD, No bruit, Trachea midline. Lungs:  Clearing, Bilateral. Cardiac:  Regular rhythm, normal S1 and S2, no S3. II/VI systolic murmur. Abdomen:  Soft, non-tender. BS present. Extremities:  No edema present. No cyanosis. No clubbing. CNS: AxOx0. Flaccid left side extremities.  Skin: Warm and dry.   Intake/Output from previous day: 07/18 0701 - 07/19 0700 In: 1423.9 [I.V.:1223.9; IV Piggyback:200] Out: 650 [Urine:650]    Lab Results: BMET    Component Value Date/Time   NA 150 (H) 07/18/2021 0400   NA 147 (H) 07/17/2021 0118   NA 143 07/16/2021 0259   K 5.4 (H) 07/18/2021 0400   K 3.7 07/17/2021 0118   K 4.4 07/16/2021 0259   CL 119 (H) 07/18/2021 0400   CL 117 (H) 07/17/2021 0118   CL 111 07/16/2021 0259   CO2 23 07/18/2021 0400   CO2 21 (L) 07/17/2021 0118   CO2 21 (L) 07/16/2021 0259   GLUCOSE 108 (H) 07/18/2021 0400   GLUCOSE 106 (H) 07/17/2021 0118   GLUCOSE 136 (H) 07/16/2021 0259   BUN 50 (H) 07/18/2021 0400   BUN 30 (H) 07/17/2021 0118   BUN 31 (H) 07/16/2021 0259   CREATININE 1.87 (H) 07/18/2021 0400   CREATININE 1.39 (H) 07/17/2021 0118   CREATININE 1.40 (H) 07/16/2021 0259   CALCIUM 8.8 (L) 07/18/2021 0400   CALCIUM 8.9 07/17/2021 0118   CALCIUM 9.3 07/16/2021  0259   GFRNONAA 25 (L) 07/18/2021 0400   GFRNONAA 35 (L) 07/17/2021 0118   GFRNONAA 35 (L) 07/16/2021 0259   GFRAA 41 (L) 05/06/2020 0916   GFRAA 30 (L) 04/13/2020 0407   GFRAA 27 (L) 04/11/2020 0338   CBC    Component Value Date/Time   WBC 12.4 (H) 07/17/2021 0118   RBC 3.89 07/17/2021 0118   HGB 11.0 (L) 07/17/2021 0118   HCT 35.5 (L) 07/17/2021 0118   PLT 319 07/17/2021 0118   MCV 91.3 07/17/2021 0118   MCH 28.3 07/17/2021 0118   MCHC 31.0 07/17/2021 0118   RDW 14.8 07/17/2021 0118   LYMPHSABS 0.7 07/16/2021 0259   MONOABS 0.8 07/16/2021 0259   EOSABS 0.0 07/16/2021 0259   BASOSABS 0.0 07/16/2021 0259   HEPATIC Function Panel Recent Labs    07/05/2021 1249 07/17/21 0118 07/18/21 0400  PROT 8.5* 7.1 7.7   HEMOGLOBIN A1C No components found for: HGA1C,  MPG CARDIAC ENZYMES Lab Results  Component Value Date   CKTOTAL 1,118 (H) 07/16/2021   BNP No results for input(s): PROBNP in the last 8760 hours. TSH No results for input(s): TSH in the last 8760 hours. CHOLESTEROL Recent Labs    07/01/2021 1249 07/16/21 0259  CHOL 158 164    Scheduled Meds:   stroke: mapping our early stages of recovery book  Does not apply Once   aspirin  150 mg Rectal Daily   furosemide  20 mg Intravenous Daily   [START ON 07/19/2021] levothyroxine  75 mcg Intravenous Daily   metoprolol tartrate  2.5 mg Intravenous Q6H   sodium chloride flush  3 mL Intravenous Once   Continuous Infusions:  cefTRIAXone (ROCEPHIN)  IV Stopped (07/18/21 0230)   dextrose 5 % and 0.45% NaCl 75 mL/hr at 07/18/21 0810   PRN Meds:.acetaminophen **OR** acetaminophen (TYLENOL) oral liquid 160 mg/5 mL **OR** acetaminophen, acetaminophen, labetalol, senna-docusate  Assessment/Plan:  Acute right MCA ischemic stroke NSTEMI LBBB HTN Hypothyroidism  Plan: continue B-blocker and amiodarone.   LOS: 3 days   Time spent including chart review, lab review, examination, discussion with patient/Nurse : 30  min   Orpah Cobb  MD  07/18/2021, 8:41 AM

## 2021-07-18 NOTE — Progress Notes (Signed)
Physical Therapy Treatment Patient Details Name: Latasha Norton MRN: [id number] DOB: 1925/03/27 Today's Date: 07/18/2021    History of Present Illness Pt is a 87 y/o female admitted secondary to L sided weakness, R gaze and aphasia.Found down by family at pt's home. MRI of the brain revealed an acute evolving R MCA infarct. On 7/17 pt had a NSTEMI and VT on 7/18. PMH including but not limited to chronic systolic CHF, LVEF 45 to 50% 2021, CKD stage III, HTN, hypothyroidism.    PT Comments    Pt with eyes closed throughout session with exception of opening twice, pt attempted to verbalize once. Pt rolled each way with stable HR of 98-102 bpm but no participation from pt. Pt grimaces with pressure on flaccid L side and with knee flexion R side. Given pt's level of arousal and recent cardiac events did not further mobility since pt was not participating. Decreased frequency to 2x/wk and will watch for goals of care after palliative meeting for potentially signing off.     Follow Up Recommendations  SNF     Equipment Recommendations  None recommended by PT    Recommendations for Other Services       Precautions / Restrictions Precautions Precautions: Fall Restrictions Weight Bearing Restrictions: No    Mobility  Bed Mobility Overal bed mobility: Needs Assistance Bed Mobility: Rolling Rolling: Max assist;+2 for physical assistance         General bed mobility comments: max +2 to roll each way, pt facial expression showed discomfort with L SL. HR remained 99-102 bpm with rolling. Given pt's lack of participation and recent cardiac events, did not further bed mobility    Transfers                 General transfer comment: deferred due to level of arousal  Ambulation/Gait                 Stairs             Wheelchair Mobility    Modified Rankin (Stroke Patients Only) Modified Rankin (Stroke Patients Only) Pre-Morbid Rankin Score: No symptoms Modified  Rankin: Severe disability     Balance                                            Cognition Arousal/Alertness: Lethargic Behavior During Therapy: Flat affect Overall Cognitive Status: Difficult to assess                                 General Comments: pt did not follow any commands      Exercises General Exercises - Lower Extremity Ankle Circles/Pumps: PROM;Both;15 reps;Supine Heel Slides: PROM;Both;10 reps;Supine Straight Leg Raises: PROM;Both;10 reps;Supine    General Comments General comments (skin integrity, edema, etc.): SPO2 100% on 2L, HR 98-102 bpm, BP 133/71. LUE/LLE flacid throughout      Pertinent Vitals/Pain Pain Assessment: Faces Faces Pain Scale: Hurts little more Pain Location: right knee with movement Pain Descriptors / Indicators: Guarding;Grimacing Pain Intervention(s): Monitored during session;Limited activity within patient's tolerance;Repositioned    Home Living                      Prior Function            PT Goals (current goals can now  be found in the care plan section) Acute Rehab PT Goals Patient Stated Goal: none stated PT Goal Formulation: Patient unable to participate in goal setting Time For Goal Achievement: 07/30/21 Potential to Achieve Goals: Poor Progress towards PT goals: Not progressing toward goals - comment (level of arousal)    Frequency    Min 2X/week      PT Plan Frequency needs to be updated    Co-evaluation              AM-PAC PT "6 Clicks" Mobility   Outcome Measure  Help needed turning from your back to your side while in a flat bed without using bedrails?: Total Help needed moving from lying on your back to sitting on the side of a flat bed without using bedrails?: Total Help needed moving to and from a bed to a chair (including a wheelchair)?: Total Help needed standing up from a chair using your arms (e.g., wheelchair or bedside chair)?: Total Help needed  to walk in hospital room?: Total Help needed climbing 3-5 steps with a railing? : Total 6 Click Score: 6    End of Session Equipment Utilized During Treatment: Oxygen Activity Tolerance: Patient limited by lethargy Patient left: in bed;with call bell/phone within reach;with bed alarm set;Other (comment) (R hand mitten donned) Nurse Communication: Mobility status PT Visit Diagnosis: Other abnormalities of gait and mobility (R26.89);Hemiplegia and hemiparesis Hemiplegia - Right/Left: Left Hemiplegia - caused by: Cerebral infarction     Time: 1152-1206 PT Time Calculation (min) (ACUTE ONLY): 14 min  Charges:  $Therapeutic Exercise: 8-22 mins                     Lyanne Co, PT  Acute Rehab Services  Pager [phone] Office [phone]    Lawana Chambers Donnah Levert 07/18/2021, 1:10 PM

## 2021-07-18 NOTE — Plan of Care (Signed)
  Problem: Self-Care: Goal: Ability to communicate needs accurately will improve Outcome: Progressing   Problem: Nutrition: Goal: Risk of aspiration will decrease Outcome: Progressing   Problem: Ischemic Stroke/TIA Tissue Perfusion: Goal: Complications of ischemic stroke/TIA will be minimized Outcome: Progressing   

## 2021-07-18 NOTE — Care Management Important Message (Signed)
Important Message  Patient Details  Name: Latasha Norton MRN: [id number] Date of Birth: [birth year]   Medicare Important Message Given:  Yes     Dorena Bodo 07/18/2021, 3:01 PM

## 2021-07-18 NOTE — Progress Notes (Signed)
PROGRESS NOTE    Latasha Norton  DTO:[id number] DOB: 11/08/1925 DOA: 07/29/2021 PCP: Patient, No Pcp Per (Inactive)    Brief Narrative:  87 y.o. female with medical history significant of chronic systolic CHF, LVEF 45 to 50% 2021, CKD stage III, HTN, hypothyroidism, presented with new onset of left-sided weakness, left-sided neglect and speech problem. Pt was found to have large R sided CVA and NSTEMI.  Assessment & Plan:   Active Problems:   Stroke (cerebrum) (HCC)   Acute ischemic stroke (HCC)  Acute aphasia, right 3rd nerve palsy, right-sided facial droop and tongue deviation, and left-sided paresis -Secondary to large right MCA stroke -Given the size of stroke > one third of MCA territory, hemorrhagic transformation risk significantly high, hold off chemical DVT prophylaxis for now.  SCD for DVT prophylaxis. -Neurology had earlier recommended permissive htn over 5-7 days -Aspirin suppository ordered -did not pass swallow eval, remains NPO -Check A1c of 6.2 -Lipid panel of 82 -Appreciate input by Neurology. Per Neuro, patient is unlikely to survive this event and make meaningful recovery given her advanced age. Recommendation for full palliative -Palliative Care is following with another family meeting planned for this afternoon   Acute hypoxic respite failure -Given the nature of bilateral crackles, and strong history of noncompliant with CHF/BP medications -Held further lasix secondary to clinical dehydration   Pnemonia with sepsis present on admission -CXR reviewed, suggestive of pna. Pt with leukocytosis, fevers, tachypnea -Pt was continued on azithromycin and rocephin. Have discontinued azithromycin given concerns of developing VT in light of NSTEMI   HTN, uncontrolled -Likely from noncompliant with BP meds -Currently continued on permissive HTN per Neurology -Cont on low dose IV metoprolol in the setting of NSTEMI   CKD stage III -Signs of overload, cont lasix per  above   Elevated Glucose -a1c of 6.2  NSTEMI -trop peaking to 9792 at time of presentation, tropoinin is slightly trending down -Appreciate assistance by Cardiology -Recommendations for continued beta blocker and amiodarone -This AM, pt complained of chest pains. Have ordered nitro paste and PRN morphine -Palliative Care is following  Ventricular tachycardia -New event this visit -Likely in setting of above NSTEMI -Continued on metoprolol -Amiodarone bolus 7/18 and 7/19 per Cardiology -Palliative Care to continue to discuss Goals of Care -Prognosis is grim   DVT prophylaxis: SCD's Code Status: Full Family Communication: Pt in room, family currently not at bedside  Status is: Inpatient  Remains inpatient appropriate because:Inpatient level of care appropriate due to severity of illness  Dispo: The patient is from: Home              Anticipated d/c is to: SNF              Patient currently is not medically stable to d/c.   Difficult to place patient No   Consultants:  Neurology Cardiology Palliative Care  Procedures:    Antimicrobials: Anti-infectives (From admission, onward)    Start     Dose/Rate Route Frequency Ordered Stop   07/16/21 0215  cefTRIAXone (ROCEPHIN) 1 g in sodium chloride 0.9 % 100 mL IVPB        1 g 200 mL/hr over 30 Minutes Intravenous Every 24 hours 07/16/21 0118     07/16/21 0215  azithromycin (ZITHROMAX) 500 mg in sodium chloride 0.9 % 250 mL IVPB  Status:  Discontinued        500 mg 250 mL/hr over 60 Minutes Intravenous Every 24 hours 07/16/21 0118 07/17/21 1141  Subjective: Unable to obtain secondary to pt's mentation  Objective: Vitals:   07/17/21 2326 07/18/21 0323 07/18/21 0755 07/18/21 1227  BP: (!) 142/75  (!) 141/71 139/73  Pulse:  98 (!) 104 (!) 101  Resp:  (!) 26 (!) 26 (!) 26  Temp: 98.8 F (37.1 C) 98.8 F (37.1 C) 99.6 F (37.6 C) 99.1 F (37.3 C)  TempSrc: Oral Axillary Axillary Axillary  SpO2: 100%  100%  100%  Weight:      Height:        Intake/Output Summary (Last 24 hours) at 07/18/2021 1439 Last data filed at 07/18/2021 9622 Gross per 24 hour  Intake 1423.85 ml  Output 650 ml  Net 773.85 ml    Filed Weights   08/02/21 1250  Weight: 55.2 kg    Examination: General exam: Awake, laying in bed, in nad Respiratory system: Normal respiratory effort, no wheezing Cardiovascular system: regular rate, s1, s2 Gastrointestinal system: Soft, nondistended, positive BS Central nervous system: CN2-12 grossly intact, strength intact Extremities: Perfused, no clubbing Skin: Normal skin turgor, no notable skin lesions seen Psychiatry: Unable to assess given mentation  Data Reviewed: I have personally reviewed following labs and imaging studies  CBC: Recent Labs  Lab 2021-08-02 1248 08/02/2021 1249 07/16/21 0259 07/17/21 0118  WBC  --  14.2* 11.9* 12.4*  NEUTROABS  --  13.4* 10.4*  --   HGB 13.3 12.3 12.5 11.0*  HCT 39.0 39.5 38.7 35.5*  MCV  --  93.2 89.6 91.3  PLT  --  381 386 319    Basic Metabolic Panel: Recent Labs  Lab 08-02-2021 1248 08/02/2021 1249 07/16/21 0259 07/17/21 0118 07/18/21 0400  NA 144 143 143 147* 150*  K 3.6 3.6 4.4 3.7 5.4*  CL 112* 108 111 117* 119*  CO2  --  22 21* 21* 23  GLUCOSE 162* 173* 136* 106* 108*  BUN 29* 26* 31* 30* 50*  CREATININE 1.10* 1.32* 1.40* 1.39* 1.87*  CALCIUM  --  9.4 9.3 8.9 8.8*  MG  --   --   --  2.2 2.5*    GFR: Estimated Creatinine Clearance: 16.4 mL/min (A) (by C-G formula based on SCr of 1.87 mg/dL (H)). Liver Function Tests: Recent Labs  Lab 08-02-2021 1249 07/17/21 0118 07/18/21 0400  AST 55* 114* 91*  ALT 26 32 37  ALKPHOS 47 41 47  BILITOT 0.3 0.6 0.7  PROT 8.5* 7.1 7.7  ALBUMIN 3.2* 2.6* 2.6*    No results for input(s): LIPASE, AMYLASE in the last 168 hours. No results for input(s): AMMONIA in the last 168 hours. Coagulation Profile: Recent Labs  Lab 2021-08-02 1249  INR 1.1    Cardiac Enzymes: Recent  Labs  Lab 02-Aug-2021 2059 07/16/21 0259  CKTOTAL 1,012* 1,118*    BNP (last 3 results) No results for input(s): PROBNP in the last 8760 hours. HbA1C: Recent Labs    07/16/21 0259  HGBA1C 6.2*    CBG: Recent Labs  Lab 08-02-2021 1239  GLUCAP 153*    Lipid Profile: Recent Labs    07/16/21 0259  CHOL 164  HDL 64  LDLCALC 87  TRIG 67  CHOLHDL 2.6    Thyroid Function Tests: No results for input(s): TSH, T4TOTAL, FREET4, T3FREE, THYROIDAB in the last 72 hours. Anemia Panel: No results for input(s): VITAMINB12, FOLATE, FERRITIN, TIBC, IRON, RETICCTPCT in the last 72 hours. Sepsis Labs: No results for input(s): PROCALCITON, LATICACIDVEN in the last 168 hours.  Recent Results (from the past 240 hour(s))  SARS CORONAVIRUS 2 (TAT 6-24 HRS) Nasopharyngeal Nasopharyngeal Swab     Status: None   Collection Time: 07/09/2021  2:03 PM   Specimen: Nasopharyngeal Swab  Result Value Ref Range Status   SARS Coronavirus 2 NEGATIVE NEGATIVE Final    Comment: (NOTE) SARS-CoV-2 target nucleic acids are NOT DETECTED.  The SARS-CoV-2 RNA is generally detectable in upper and lower respiratory specimens during the acute phase of infection. Negative results do not preclude SARS-CoV-2 infection, do not rule out co-infections with other pathogens, and should not be used as the sole basis for treatment or other patient management decisions. Negative results must be combined with clinical observations, patient history, and epidemiological information. The expected result is Negative.  Fact Sheet for Patients: HairSlick.no  Fact Sheet for Healthcare Providers: quierodirigir.com  This test is not yet approved or cleared by the [place] FDA and  has been authorized for detection and/or diagnosis of SARS-CoV-2 by FDA under an Emergency Use Authorization (EUA). This EUA will remain  in effect (meaning this test can be used) for the  duration of the COVID-19 declaration under Se ction 564(b)(1) of the Act, 21 U.S.C. section 360bbb-3(b)(1), unless the authorization is terminated or revoked sooner.  Performed at [facility], [address] [place], [place] [address]   Culture, blood (routine x 2)     Status: None (Preliminary result)   Collection Time: 07/16/21  4:30 PM   Specimen: BLOOD LEFT HAND  Result Value Ref Range Status   Specimen Description BLOOD LEFT HAND  Final   Special Requests   Final    BOTTLES DRAWN AEROBIC AND ANAEROBIC Blood Culture adequate volume   Culture   Final    NO GROWTH 2 DAYS Performed at [facility], [address] [place], [place] [address]    Report Status PENDING  Incomplete  Culture, blood (routine x 2)     Status: None (Preliminary result)   Collection Time: 07/16/21  4:37 PM   Specimen: BLOOD RIGHT ARM  Result Value Ref Range Status   Specimen Description BLOOD RIGHT ARM  Final   Special Requests   Final    BOTTLES DRAWN AEROBIC ONLY Blood Culture adequate volume   Culture   Final    NO GROWTH 2 DAYS Performed at [facility], [address] [place], [place] [address]    Report Status PENDING  Incomplete      Radiology Studies: ECHOCARDIOGRAM COMPLETE  Result Date: 07/16/2021    ECHOCARDIOGRAM REPORT   Patient Name:   CHAYE MISCH Date of Exam: 07/16/2021 Medical Rec #:  [id number]      Height:       66.0 in Accession #:    [id number]     Weight:       121.7 lb Date of Birth:  Oct 26, 1925      BSA:          1.619 m Patient Age:    89 years       BP:           155/85 mmHg Patient Gender: F              HR:           102 bpm. Exam Location:  Inpatient Procedure: 2D Echo Indications:     stroke  History:         Patient has prior history of Echocardiogram examinations, most  recent 04/07/2020. Sick sinus syndrome, chronic kidney disease;                  Risk Factors:Hypertension.  Sonographer:     Delcie Roch Referring Phys:  [id number]  Kara Mead Diagnosing Phys: Orpah Cobb MD  Sonographer Comments: Image acquisition challenging due to uncooperative patient and Image acquisition challenging due to respiratory motion. IMPRESSIONS  1. Left ventricular ejection fraction, by estimation, is 25 to 30%. The left ventricle has severely decreased function. The left ventricle demonstrates global hypokinesis. There is mild concentric left ventricular hypertrophy. Left ventricular diastolic  parameters are consistent with Grade I diastolic dysfunction (impaired relaxation). There is severe hypokinesis of the left ventricular, entire septal wall and inferior wall.  2. Right ventricular systolic function is normal. The right ventricular size is normal. There is mildly elevated pulmonary artery systolic pressure.  3. Left atrial size was severely dilated.  4. Right atrial size was mildly dilated.  5. The mitral valve is degenerative. Moderate to severe mitral valve regurgitation. Moderate mitral annular calcification.  6. The aortic valve is tricuspid. There is mild calcification of the aortic valve. There is mild thickening of the aortic valve. Aortic valve regurgitation is moderate.  7. There is mild (Grade II) atheroma plaque involving the aortic root and ascending aorta.  8. The inferior vena cava is normal in size with <50% respiratory variability, suggesting right atrial pressure of 8 mmHg. FINDINGS  Left Ventricle: Left ventricular ejection fraction, by estimation, is 25 to 30%. The left ventricle has severely decreased function. The left ventricle demonstrates global hypokinesis. Severe hypokinesis of the left ventricular, entire septal wall and inferior wall. The left ventricular internal cavity size was normal in size. There is mild concentric left ventricular hypertrophy. Left ventricular diastolic parameters are consistent with Grade I diastolic dysfunction (impaired relaxation). Right Ventricle: The right ventricular size is normal. No  increase in right ventricular wall thickness. Right ventricular systolic function is normal. There is mildly elevated pulmonary artery systolic pressure. The tricuspid regurgitant velocity is 2.96  m/s, and with an assumed right atrial pressure of 3 mmHg, the estimated right ventricular systolic pressure is 38.0 mmHg. Left Atrium: Left atrial size was severely dilated. Right Atrium: Right atrial size was mildly dilated. Pericardium: There is no evidence of pericardial effusion. Mitral Valve: The mitral valve is degenerative in appearance. There is moderate thickening of the mitral valve leaflet(s). There is moderate calcification of the mitral valve leaflet(s). Moderate mitral annular calcification. Moderate to severe mitral valve regurgitation. Tricuspid Valve: The tricuspid valve is normal in structure. Tricuspid valve regurgitation is mild. Aortic Valve: The aortic valve is tricuspid. There is mild calcification of the aortic valve. There is mild thickening of the aortic valve. There is mild aortic valve annular calcification. Aortic valve regurgitation is moderate. Aortic regurgitation PHT  measures 283 msec. Pulmonic Valve: The pulmonic valve was normal in structure. Pulmonic valve regurgitation is not visualized. Aorta: The aortic root is normal in size and structure. There is mild (Grade II) atheroma plaque involving the aortic root and ascending aorta. Venous: The inferior vena cava is normal in size with less than 50% respiratory variability, suggesting right atrial pressure of 8 mmHg. IAS/Shunts: The atrial septum is grossly normal.  LEFT VENTRICLE PLAX 2D LVIDd:         4.70 cm LVIDs:         4.20 cm LV PW:         1.10 cm LV IVS:  1.20 cm LVOT diam:     1.80 cm LV SV:         29 LV SV Index:   18 LVOT Area:     2.54 cm  RIGHT VENTRICLE             IVC RV S prime:     11.10 cm/s  IVC diam: 1.50 cm TAPSE (M-mode): 1.8 cm LEFT ATRIUM             Index       RIGHT ATRIUM           Index LA diam:         4.50 cm 2.78 cm/m  RA Area:     12.80 cm LA Vol (A2C):   85.8 ml 52.99 ml/m RA Volume:   25.50 ml  15.75 ml/m LA Vol (A4C):   61.9 ml 38.23 ml/m LA Biplane Vol: 72.7 ml 44.90 ml/m  AORTIC VALVE LVOT Vmax:   90.00 cm/s LVOT Vmean:  54.500 cm/s LVOT VTI:    0.112 m AI PHT:      283 msec  AORTA Ao Root diam: 2.80 cm MR Peak grad: 106.1 mmHg  TRICUSPID VALVE MR Mean grad: 56.0 mmHg   TR Peak grad:   35.0 mmHg MR Vmax:      515.00 cm/s TR Vmax:        296.00 cm/s MR Vmean:     344.0 cm/s                           SHUNTS                           Systemic VTI:  0.11 m                           Systemic Diam: 1.80 cm Orpah CobbAjay Kadakia MD Electronically signed by Orpah CobbAjay Kadakia MD Signature Date/Time: 07/16/2021/3:43:21 PM    Final     Scheduled Meds:   stroke: mapping our early stages of recovery book   Does not apply Once   aspirin  150 mg Rectal Daily   [START ON 07/19/2021] levothyroxine  75 mcg Intravenous Daily   metoprolol tartrate  2.5 mg Intravenous Q6H   sodium chloride flush  3 mL Intravenous Once   Continuous Infusions:  cefTRIAXone (ROCEPHIN)  IV Stopped (07/18/21 0230)   dextrose 5 % and 0.45% NaCl 100 mL/hr at 07/18/21 1248     LOS: 3 days   Rickey BarbaraStephen Claudie Rathbone, MD [facility] Pager On Amion  If 7PM-7AM, please contact night-coverage 07/18/2021, 2:39 PM

## 2021-07-18 NOTE — Progress Notes (Addendum)
Daily Progress Note   Patient Name: Latasha Norton       Date: 07/18/2021 DOB: Mar 30, 1925  Age: 87 y.o. MRN#: [id number] Attending Physician: Latasha Hazel, MD Primary Care Physician: Patient, No Pcp Per (Inactive) Admit Date: 07/27/2021 Length of Stay: 3 days  Reason for Consultation/Follow-up: Establishing goals of care  HPI/Patient Profile:  87 y.o. female  with past medical history of chronic systolic CHF (EF 56-43% in 2021), CKD stage III, HTN, and hypothyroidism presenting to the emergency department on 07/23/2021 with left-sided weakness and aphasia. Family reports she has a history of non-compliance with her BP medications. Family had a phone conversation with patient the prior evening and patient was speaking normally. The morning of 7/16, family went into her room and found patient on the floor with obvious left-sided weakness and unable to stand or speak. In the ED, Blood pressure significantly elevated 176/66, hypoxia stabilized on 5L. CT head showing large right-sided MCA acute infarct. MRI confirmed large evolving ischemic right MCA territory infarct with no areas of hemorrhage.   On 7/17, patient unfortunately suffered a NSTEMI (troponin greater than 9000). EF now 25-30%.   On 7/18 the patient developed VTach (rate 130s) and was treated with Metoprolol. Rate down to 100-105, apparent p-wave, wide complex with BBB. Her son Latasha Norton was at bedside.  Current Medications: Scheduled Meds:    stroke: mapping our early stages of recovery book   Does not apply Once   aspirin  150 mg Rectal Daily   [START ON 07/19/2021] levothyroxine  75 mcg Intravenous Daily   metoprolol tartrate  2.5 mg Intravenous Q6H   sodium chloride flush  3 mL Intravenous Once    Continuous Infusions:  cefTRIAXone (ROCEPHIN)  IV Stopped (07/18/21 0230)   dextrose 5 % and 0.45% NaCl 100 mL/hr at 07/18/21 1248    PRN Meds: acetaminophen **OR** acetaminophen (TYLENOL) oral liquid 160 mg/5 mL **OR**  acetaminophen, acetaminophen, labetalol, morphine injection, nitroGLYCERIN, senna-docusate  Subjective:   Subjective: Chart Reviewed. Updates received. Patient Assessed. Created space and opportunity for patient  and family to explore thoughts and feelings regarding current medical situation.  Today's Discussion: Met at the bedside with the patient's son Latasha Norton, granddaughter (and daughter Latasha Norton's representative) Latasha Norton, and Minister Latasha Norton. We explained the scope and role of palliative care. Discussed the patient's clinical course thus far: large Right MCA embolic stroke with high risk for hemorrhagic conversion, NSTEMI, acute on chronic heart failure, AKI, decreasing interaction. We discussed the concept of multiple illnesses, especially with a large CVA, and neurology/hospitalist grim prognosis and high probability she will not survive the hospitalization. They seemed to verbalize understanding of this.  We discussed code status and the difference between comfort care and aggressive care. Her son Latasha Norton is holding out hope for recovery stating "there's not a 0% chance, right? So there's some chance and she's a fighter." The family decided to make the patient a DNR and shift to comfort care. We discussed stopping lab draws, unnecessary/non-comfort medications, monitoring. They request to leave oxygen on (explained this will not help comfort, but we can leave it on). Discussed use of medications for comfort, anxiety, dyspnea, etc. They have agreed to all of this, son wants to continue to monitor for improvement when on comfort care.  We discussed the idea of comfort feeds for many end-of-life/comfort care patients, but they do not want this due to aspiration risk. I agree she is not responsive enough to "eat" or "drink" but will continue to  provide mouth care.  Review of Systems  Unable to perform ROS: Acuity of condition   Objective:   Vital Signs: BP 139/73 (BP Location: Left  Arm)   Pulse (!) 101   Temp 99.1 F (37.3 C) (Axillary)   Resp (!) 26   Ht 5' 6"  (1.676 m)   Wt 55.2 kg   SpO2 100%   BMI 19.64 kg/m  SpO2: SpO2: 100 % O2 Device: O2 Device: Nasal Cannula O2 Flow Rate: O2 Flow Rate (L/min): 2 L/min  Physical Exam: Physical Exam Vitals and nursing note reviewed.  Constitutional:      General: She is sleeping. She is not in acute distress.    Appearance: She is normal weight. She is ill-appearing.  Cardiovascular:     Rate and Rhythm: Tachycardia present.     Heart sounds: Murmur heard.  Pulmonary:     Effort: Pulmonary effort is normal. No respiratory distress.     Breath sounds: Normal breath sounds.  Abdominal:     General: Abdomen is flat.     Palpations: Abdomen is soft.  Skin:    General: Skin is warm and dry.  Neurological:     Mental Status: She is lethargic.    SpO2: SpO2: 100 % O2 Device:SpO2: 100 % O2 Flow Rate: .O2 Flow Rate (L/min): 2 L/min  IO: Intake/output summary:  Intake/Output Summary (Last 24 hours) at 07/18/2021 1607 Last data filed at 07/18/2021 0519 Gross per 24 hour  Intake 1423.85 ml  Output 650 ml  Net 773.85 ml    LBM: Last BM Date: 07/16/21 Baseline Weight: Weight: 55.2 kg Most recent weight: Weight: 55.2 kg   Palliative Assessment/Data: 10%   Assessment & Plan:   Impression:  Very sick 87 year old female status post large right MCA CVA, subsequent MI, acute on chronic heart failure now with EF 25 to 30%, status post V. tach (with pulse) yesterday, worsening kidney function.  No known advanced directives, the patient is not married but has 3 children which are shared decision makers.  Initially the decision was made for full scope, full code.  However, with the cardiac arrhythmias yesterday I attempted to reach out to further address goals of care and a family meeting was scheduled for today at 3:00.   SUMMARY OF RECOMMENDATIONS   Change to DNR Shift to comfort care Liberalize visitation Stop lab  draws Stop unnecessary medications, tele monitoring Monitor her progress  Code Status: DNR  Goals of Care/Recommendations: Comfort care  Symptom Management: Haldol prn anxiety Robinul prn secretions Changed morphine to dilaudid 0.5 mg q2 hours prn pain or dyspnea Further recommendations as needed  Prognosis: Hours - Days  Discharge Planning: Anticipated Hospital Death  Discussed with: Patient's family, nursing staff, Dr. Wyline Copas (attending)  Thank you for allowing [place] to participate in the care of Earney Hamburg PMT will continue to support holistically.  Time Total: 115 min  Visit consisted of counseling and education dealing with the complex and emotionally intense issues of symptom management and palliative care in the setting of serious and potentially life-threatening illness. Greater than 50%  of this time was spent counseling and coordinating care related to the above assessment and plan.  Walden Field, NP Palliative Medicine Team  Team Phone # [phone] (Nights/Weekends)  07/18/2021, 4:07 PM

## 2021-07-19 DIAGNOSIS — I5022 Chronic systolic (congestive) heart failure: Secondary | ICD-10-CM

## 2021-07-19 DIAGNOSIS — J690 Pneumonitis due to inhalation of food and vomit: Secondary | ICD-10-CM | POA: Diagnosis not present

## 2021-07-19 DIAGNOSIS — I639 Cerebral infarction, unspecified: Secondary | ICD-10-CM | POA: Diagnosis not present

## 2021-07-19 DIAGNOSIS — I472 Ventricular tachycardia, unspecified: Secondary | ICD-10-CM

## 2021-07-19 DIAGNOSIS — I700 Atherosclerosis of aorta: Secondary | ICD-10-CM

## 2021-07-19 DIAGNOSIS — Z66 Do not resuscitate: Secondary | ICD-10-CM

## 2021-07-19 DIAGNOSIS — N179 Acute kidney failure, unspecified: Secondary | ICD-10-CM | POA: Diagnosis not present

## 2021-07-19 DIAGNOSIS — I214 Non-ST elevation (NSTEMI) myocardial infarction: Secondary | ICD-10-CM

## 2021-07-19 DIAGNOSIS — Z515 Encounter for palliative care: Secondary | ICD-10-CM | POA: Diagnosis not present

## 2021-07-19 DIAGNOSIS — J9601 Acute respiratory failure with hypoxia: Secondary | ICD-10-CM | POA: Diagnosis not present

## 2021-07-19 DIAGNOSIS — G9341 Metabolic encephalopathy: Secondary | ICD-10-CM

## 2021-07-19 LAB — CREATININE, SERUM
Creatinine, Ser: 3.6 mg/dL — ABNORMAL HIGH (ref 0.44–1.00)
GFR, Estimated: 11 mL/min — ABNORMAL LOW (ref >60)

## 2021-07-19 MED ORDER — SODIUM CHLORIDE 0.9 % IV SOLN
1.5000 g | INTRAVENOUS | Status: DC
  Filled 2021-07-19: qty 4

## 2021-07-19 MED ORDER — SODIUM CHLORIDE 0.9 % IV SOLN
1.5000 g | Freq: Two times a day (BID) | INTRAVENOUS | Status: DC
  Administered 2021-07-19: 1.5 g via INTRAVENOUS
  Filled 2021-07-19 (×2): qty 4

## 2021-07-19 NOTE — Assessment & Plan Note (Signed)
--  secondary to aspiration pneumonia --tx pneumonia, wean oxygen as tolerated

## 2021-07-19 NOTE — Progress Notes (Signed)
Daily Progress Note   Patient Name: Latasha Norton       Date: 07/19/2021 DOB: 1925/04/07  Age: 87 y.o. MRN#: [id number] Attending Physician: Standley Brooking, MD Primary Care Physician: Patient, No Pcp Per (Inactive) Admit Date: 07/29/2021 Length of Stay: 4 days  Reason for Consultation/Follow-up: Establishing goals of care  HPI/Patient Profile:  87 y.o. female  with past medical history of chronic systolic CHF (EF 81-85% in 2021), CKD stage III, HTN, and hypothyroidism presenting to the emergency department on 07/24/2021 with left-sided weakness and aphasia. Family reports she has a history of non-compliance with her BP medications. Family had a phone conversation with patient the prior evening and patient was speaking normally. The morning of 7/16, family went into her room and found patient on the floor with obvious left-sided weakness and unable to stand or speak. In the ED, Blood pressure significantly elevated 176/66, hypoxia stabilized on 5L. CT head showing large right-sided MCA acute infarct. MRI confirmed large evolving ischemic right MCA territory infarct with no areas of hemorrhage.   On 7/17, patient unfortunately suffered a NSTEMI (troponin greater than 9000). EF now 25-30%.   On 7/18 the patient developed VTach (rate 130s) and was treated with Metoprolol. Rate down to 100-105, apparent p-wave, wide complex with BBB. Her son Carla Drape was at bedside.  Current Medications: Scheduled Meds:    stroke: mapping our early stages of recovery book   Does not apply Once   levothyroxine  75 mcg Intravenous Daily   metoprolol tartrate  2.5 mg Intravenous Q6H   sodium chloride flush  3 mL Intravenous Once    Continuous Infusions:  cefTRIAXone (ROCEPHIN)  IV 1 g (07/19/21 0057)   dextrose 5 % and 0.45% NaCl 100 mL/hr at 07/19/21 0607    PRN Meds: acetaminophen **OR** acetaminophen (TYLENOL) oral liquid 160 mg/5 mL **OR** acetaminophen, antiseptic oral rinse, glycopyrrolate **OR**  glycopyrrolate **OR** glycopyrrolate, haloperidol **OR** haloperidol **OR** haloperidol lactate, HYDROmorphone (DILAUDID) injection, labetalol, nitroGLYCERIN, ondansetron **OR** ondansetron (ZOFRAN) IV, polyvinyl alcohol  Subjective:   Subjective: Chart Reviewed. Updates received from [place], [place]. Patient Assessed. No family at bedside.  Today's Discussion:  RN shares that patients granddaughter stayed overnight but left earlier this AM to go shower. RN reports that family stated no concerns.   Upon assessment patient has slightly increased work of breathing - discussed with RN administering dose of dilaudid to ease respiratory distress.   Discussed with RN plan of care - from prior palliative meeting, it seems patient's son continues to hope for some improvement and though he did agree for comfort focused case IV fluids and antibiotics have been continued.   Will continue to ensure patient is comfortable above all else per prior discussions.   Wynne Dust, NP will follow up 7/21.  Review of Systems  Unable to perform ROS: Acuity of condition   Objective:   Vital Signs: BP 127/62 (BP Location: Left Arm)   Pulse 99   Temp 98.6 F (37 C)   Resp 18   Ht 5\' 6"  (1.676 m)   Wt 55.2 kg   SpO2 100%   BMI 19.64 kg/m  SpO2: SpO2: 100 % O2 Device: O2 Device: Room Air O2 Flow Rate: O2 Flow Rate (L/min): 2 L/min  Physical Exam: Physical Exam Vitals and nursing note reviewed.  Constitutional:      General: She is sleeping.     Comments: Sleeping, does not wake to voice or touch  Pulmonary:     Comments: Slight increased  work of breathing Abdominal:     Palpations: Abdomen is soft.  Skin:    General: Skin is warm and dry.  Neurological:     Comments: unresponsive    SpO2: SpO2: 100 % O2 Device:SpO2: 100 % O2 Flow Rate: .O2 Flow Rate (L/min): 2 L/min  IO: Intake/output summary: No intake or output data in the 24 hours ending 07/19/21 1043   LBM: Last BM Date:  07/16/21 Baseline Weight: Weight: 55.2 kg Most recent weight: Weight: 55.2 kg   Palliative Assessment/Data: 10%   Assessment & Plan:   Impression:  Very sick 87 year old female status post large right MCA CVA, subsequent MI, acute on chronic heart failure now with EF 25 to 30%, status post V. tach (with pulse) yesterday, worsening kidney function.  No known advanced directives, the patient is not married but has 3 children which are shared decision makers.  Initially the decision was made for full scope, full code.  However, with the cardiac arrhythmias yesterday I attempted to reach out to further address goals of care and a family meeting was scheduled for today at 3:00.   SUMMARY OF RECOMMENDATIONS   Comfort measures however continuing fluids and antibiotics at this point Liberalize visitation Stop lab draws Stop unnecessary medications, tele monitoring Monitor her progress  Code Status: DNR  Goals of Care/Recommendations: Comfort care  Symptom Management: Haldol prn anxiety Robinul prn secretions dilaudid 0.5 mg q2 hours prn pain or dyspnea Further recommendations as needed  Prognosis: Hours - Days  Discharge Planning: Anticipated Hospital Death  Discussed with: Zollie Scale, RN  Thank you for allowing [place] to participate in the care of Latasha Norton PMT will continue to support holistically.  Time Total: 20 minutes  Greater than 50%  of this time was spent counseling and coordinating care related to the above assessment and plan.  Gerlean Ren, DNP, AGNP-C Palliative Medicine Team Team Phone # [phone]  Pager # [phone]

## 2021-07-19 NOTE — Assessment & Plan Note (Signed)
--  secondary to stroke --Unable to participate in examination, currently somnolent, not able to take p.o. --supportive care

## 2021-07-19 NOTE — Hospital Course (Addendum)
87 year old woman lived independently prior to admission, PMH systolic CHF, hypertension presented with new onset left-sided weakness, left-sided neglect.  CT revealed large right-sided MCA stroke.  Seen by neurology, prognosis felt to be grim, recommendation for palliative care.  Found to have NSTEMI, seen by cardiology, recommendation for conservative management.  Other issues have included acute kidney injury, ventricular tachycardia treated with amiodarone, acute hypoxic respiratory failure secondary to sepsis secondary to aspiration pneumonia.  Seen by palliative care, made DNR, partial comfort care, antibiotics and fluids continued.  Prognosis grim, likely less than 1 week, hospital death possible.

## 2021-07-19 NOTE — Assessment & Plan Note (Signed)
--  supportive care ?

## 2021-07-19 NOTE — Assessment & Plan Note (Signed)
--  seen by cardiology --not an interventional candidate given stroke and GOC --continue supportive care --GOC partial comfort

## 2021-07-19 NOTE — Consult Note (Signed)
Ref: Patient, No Pcp Per (Inactive)   Subjective:  Poorly responding.Off monitor. HR near 100/min.  Objective:  Vital Signs in the last 24 hours: Temp:  [98.6 F (37 C)-99 F (37.2 C)] 98.6 F (37 C) (07/20 0814) Pulse Rate:  [95-102] 99 (07/20 0814) Resp:  [18-20] 18 (07/20 0814) BP: (122-130)/(62-71) 127/62 (07/20 0814) SpO2:  [100 %] 100 % (07/20 0814)  Physical Exam: BP Readings from Last 1 Encounters:  07/19/21 127/62     Wt Readings from Last 1 Encounters:  07/30/2021 55.2 kg    Weight change:  Body mass index is 19.64 kg/m. HEENT: [place]/AT, Eyes-Brown, Conjunctiva-Pink, Sclera-Non-icteric Neck: No JVD, No bruit, Trachea midline. Lungs:  Clearing, Bilateral. Cardiac:  Regular rhythm, normal S1 and S2, no S3. II/VI systolic murmur. Abdomen:  Soft, non-tender. BS present. Extremities:  No edema present. No cyanosis. No clubbing. CNS: AxOx0. Flaccid left upper and lower extremities. Skin: Warm and dry.   Intake/Output from previous day: No intake/output data recorded.    Lab Results: BMET    Component Value Date/Time   NA 150 (H) 07/18/2021 0400   NA 147 (H) 07/17/2021 0118   NA 143 07/16/2021 0259   K 5.4 (H) 07/18/2021 0400   K 3.7 07/17/2021 0118   K 4.4 07/16/2021 0259   CL 119 (H) 07/18/2021 0400   CL 117 (H) 07/17/2021 0118   CL 111 07/16/2021 0259   CO2 23 07/18/2021 0400   CO2 21 (L) 07/17/2021 0118   CO2 21 (L) 07/16/2021 0259   GLUCOSE 108 (H) 07/18/2021 0400   GLUCOSE 106 (H) 07/17/2021 0118   GLUCOSE 136 (H) 07/16/2021 0259   BUN 50 (H) 07/18/2021 0400   BUN 30 (H) 07/17/2021 0118   BUN 31 (H) 07/16/2021 0259   CREATININE 1.87 (H) 07/18/2021 0400   CREATININE 1.39 (H) 07/17/2021 0118   CREATININE 1.40 (H) 07/16/2021 0259   CALCIUM 8.8 (L) 07/18/2021 0400   CALCIUM 8.9 07/17/2021 0118   CALCIUM 9.3 07/16/2021 0259   GFRNONAA 25 (L) 07/18/2021 0400   GFRNONAA 35 (L) 07/17/2021 0118   GFRNONAA 35 (L) 07/16/2021 0259   GFRAA 41 (L)  05/06/2020 0916   GFRAA 30 (L) 04/13/2020 0407   GFRAA 27 (L) 04/11/2020 0338   CBC    Component Value Date/Time   WBC 12.4 (H) 07/17/2021 0118   RBC 3.89 07/17/2021 0118   HGB 11.0 (L) 07/17/2021 0118   HCT 35.5 (L) 07/17/2021 0118   PLT 319 07/17/2021 0118   MCV 91.3 07/17/2021 0118   MCH 28.3 07/17/2021 0118   MCHC 31.0 07/17/2021 0118   RDW 14.8 07/17/2021 0118   LYMPHSABS 0.7 07/16/2021 0259   MONOABS 0.8 07/16/2021 0259   EOSABS 0.0 07/16/2021 0259   BASOSABS 0.0 07/16/2021 0259   HEPATIC Function Panel Recent Labs    07/10/2021 1249 07/17/21 0118 07/18/21 0400  PROT 8.5* 7.1 7.7   HEMOGLOBIN A1C No components found for: HGA1C,  MPG CARDIAC ENZYMES Lab Results  Component Value Date   CKTOTAL 1,118 (H) 07/16/2021   BNP No results for input(s): PROBNP in the last 8760 hours. TSH No results for input(s): TSH in the last 8760 hours. CHOLESTEROL Recent Labs    07/10/2021 1249 07/16/21 0259  CHOL 158 164    Scheduled Meds:   stroke: mapping our early stages of recovery book   Does not apply Once   levothyroxine  75 mcg Intravenous Daily   metoprolol tartrate  2.5 mg Intravenous Q6H  sodium chloride flush  3 mL Intravenous Once   Continuous Infusions:  cefTRIAXone (ROCEPHIN)  IV 1 g (07/19/21 0057)   dextrose 5 % and 0.45% NaCl 100 mL/hr at 07/19/21 0607   PRN Meds:.acetaminophen **OR** acetaminophen (TYLENOL) oral liquid 160 mg/5 mL **OR** acetaminophen, antiseptic oral rinse, glycopyrrolate **OR** glycopyrrolate **OR** glycopyrrolate, haloperidol **OR** haloperidol **OR** haloperidol lactate, HYDROmorphone (DILAUDID) injection, labetalol, nitroGLYCERIN, ondansetron **OR** ondansetron (ZOFRAN) IV, polyvinyl alcohol  Assessment/Plan:  Acute right MCA ischemic stroke NSTEMI LBBB HTN Hypothyroidism  Plan: Continue supportive care. Prognosis poor without neurological recovery. Re-consult as needed.   LOS: 4 days   Time spent including chart review,  lab review, examination, discussion with patient/Nurse : 30 min   Orpah Cobb  MD  07/19/2021, 12:47 PM

## 2021-07-19 NOTE — Assessment & Plan Note (Signed)
--  appears euvolemic --supportive care based on GOC

## 2021-07-19 NOTE — Progress Notes (Signed)
Resting in bed with granddauighter at her side.  Does open eyes at times to commands but is obtunded for the most part.  Lungs sound clearer and she is not in resp distress.

## 2021-07-19 NOTE — Progress Notes (Signed)
PROGRESS NOTE  Latasha Norton:[id number] DOB: 1925/05/24 DOA: Jul 29, 2021 PCP: Patient, No Pcp Per (Inactive)  Brief History   87 year old woman lived independently prior to admission, PMH systolic CHF, hypertension presented with new onset left-sided weakness, left-sided neglect.  CT revealed large right-sided MCA stroke.  Seen by neurology, prognosis felt to be grim, recommendation for palliative care.  Found to have NSTEMI, seen by cardiology, recommendation for conservative management.  Other issues have included acute kidney injury, ventricular tachycardia treated with amiodarone, acute hypoxic respiratory failure secondary to sepsis secondary to aspiration pneumonia.  Seen by palliative care, made DNR, partial comfort care, antibiotics and fluids continued.  Prognosis grim, likely less than 1 week, hospital death possible.  A & P  * Acute right MCA stroke (HCC) --w/ aphasia, right 3rd nerve palsy, right-sided facial droop and tongue deviation, and left-sided hemiparesis and hemianopsia secondary to M1 occlusion, likely cardiogenic etiology from cardiomyopathy and low EF -- Prognosis grim, remains encephalopathic, unable to take p.o. -- Permissive hypertension, no DVT prophylaxis given significant risk for hemorrhagic transformation. -- Significant neurologic recovery doubted per previous medical team.  Expect deficits to include hemiparesis on the left side, visual loss left side, left side neglect, personality change -- Continue supportive care  VT (ventricular tachycardia) (HCC) --secondary to NSTEMI --treated w/ amiodarone (now stopped given GOC) --cardiology signed off  Acute metabolic encephalopathy --secondary to stroke --Unable to participate in examination, currently somnolent, not able to take p.o. --supportive care  AKI (acute kidney injury) (HCC) --secondary to multiple acute issues, complicated by inability to take PO --continue IVF for now based on GOC --no labs  based on GOC  NSTEMI (non-ST elevated myocardial infarction) (HCC) --seen by cardiology --not an interventional candidate given stroke and GOC --continue supportive care --GOC partial comfort  Acute hypoxemic respiratory failure (HCC) --secondary to aspiration pneumonia --tx pneumonia, wean oxygen as tolerated  Aspiration pneumonia (HCC) --w/ sepsis and hypoxia, present on admission, secondary to stroke --will change to unasyn for now given family desire to continue to treat --wean oxygen as tolerated  Chronic systolic CHF (congestive heart failure) (HCC) --appears euvolemic --supportive care based on GOC  Aortic atherosclerosis (HCC) --no tx indicated given GOC  Benign essential HTN --supportive care  Disposition Plan:  Discussion: Prognosis is grim given multiple acute issues, survival in doubt, may not survive to discharge.  If stabilizes could consider residential hospice.  Meaningful recovery not anticipated.  Appreciate palliative care.  Family has elected partial comfort care although antibiotics and fluids continue.  Reevaluate again tomorrow.  Status is: Inpatient  Remains inpatient appropriate because:IV treatments appropriate due to intensity of illness or inability to take PO and Inpatient level of care appropriate due to severity of illness  Dispo: The patient is from: Home              Anticipated d/c is to:  TBD              Patient currently is not medically stable to d/c.   Difficult to place patient No  DVT prophylaxis: none based on GOC   Code Status: DNR Level of care: Progressive Family Communication: none  Brendia Sacks, MD  [facility] Direct contact: see [email or web address] (further directions at bottom of note if needed) 7PM-7AM contact night coverage as at bottom of note 07/19/2021, 4:41 PM  LOS: 4 days    Consults:  Neurology Cardiology    Interval History/Subjective  CC: f/u stroke  Nonverbal, does not wake up  Objective    Vitals:  Vitals:   07/19/21 0600 07/19/21 0814  BP: 122/68 127/62  Pulse: (!) 102 99  Resp:  18  Temp:  98.6 F (37 C)  SpO2:  100%    Exam: Physical Exam Constitutional:      General: She is not in acute distress.    Appearance: She is not toxic-appearing.     Comments: Appears calm, comfortable, sleeping, does not rouse to voice or palpation  Cardiovascular:     Rate and Rhythm: Normal rate and regular rhythm.     Heart sounds: No murmur heard.   No gallop.  Pulmonary:     Effort: Pulmonary effort is normal. No respiratory distress.     Breath sounds: No wheezing, rhonchi or rales.  Musculoskeletal:     Right lower leg: No edema.     Left lower leg: No edema.  Neurological:     Comments: Not able to assess, does not wake up or follow commands  Psychiatric:     Comments: Cannot assess mood    I have personally reviewed the labs and other data, making special note of:   Today's Data  No new data  Scheduled Meds:   stroke: mapping our early stages of recovery book   Does not apply Once   levothyroxine  75 mcg Intravenous Daily   metoprolol tartrate  2.5 mg Intravenous Q6H   sodium chloride flush  3 mL Intravenous Once   Continuous Infusions:  dextrose 5 % and 0.45% NaCl 100 mL/hr at 07/19/21 1640    Principal Problem:   Acute right MCA stroke (HCC) Active Problems:   Acute metabolic encephalopathy   VT (ventricular tachycardia) (HCC)   Aspiration pneumonia (HCC)   Acute hypoxemic respiratory failure (HCC)   NSTEMI (non-ST elevated myocardial infarction) (HCC)   AKI (acute kidney injury) (HCC)   Chronic systolic CHF (congestive heart failure) (HCC)   Benign essential HTN   CKD (chronic kidney disease), stage III (HCC)   Aortic atherosclerosis (HCC)   LOS: 4 days   How to contact the [facility] Attending or Consulting provider 7A - 7P or covering provider during after hours 7P -7A, for this patient?  Check the care team in [facility] and look for a)  attending/consulting [facility] provider listed and b) the [facility] team listed Log into [email or web address] and use [facility] universal password to access. If you do not have the password, please contact the hospital operator. Locate the [facility] provider you are looking for under [facility] and page to a number that you can be directly reached. If you still have difficulty reaching the provider, please page the [facility] (Director on Call) for the Hospitalists listed on amion for assistance.

## 2021-07-19 NOTE — Progress Notes (Signed)
Kept comfortable and repositioned.  Has not voided all night but did have small amount of yellow vaginal discharge.  Remains on IVF.  Lungs sound clear and diminished at this time.

## 2021-07-19 NOTE — Assessment & Plan Note (Signed)
--  w/ sepsis and hypoxia, present on admission, secondary to stroke --will change to unasyn for now given family desire to continue to treat --wean oxygen as tolerated

## 2021-07-19 NOTE — Assessment & Plan Note (Signed)
--  secondary to multiple acute issues, complicated by inability to take PO --continue IVF for now based on GOC --no labs based on GOC

## 2021-07-19 NOTE — Progress Notes (Signed)
SLP Cancellation Note  Patient Details Name: Latasha Norton MRN: [id number] DOB: 08-16-1925   Cancelled treatment:       Reason Eval/Treat Not Completed: Medical issues which prohibited therapy; pt has been transitioned to comfort care; ST will s/o at this time; please re-order prn if pt condition changes.     Tressie Stalker, M.S., CCC-SLP 07/19/2021, 1:57 PM

## 2021-07-19 NOTE — Progress Notes (Addendum)
Pharmacy Antibiotic Note  Latasha Norton is a 87 y.o. female admitted on 07/09/2021 with acute R MCA stroke, NSTEMI, acute metabolic encephalopathy, AKI and aspiration pneumonia. Pharmacy has been consulted for Unasyn dosing for aspiration pneumonia.  WBC 12.4, afebrile; Scr 1.87 (7/19) and trending up, CrCl 16.4 ml/min, no Scr ordered yet today  Plan: Unasyn 1.5 gm IV Q 12 hrs Monitor WBC, temp, renal function  Height: 5\' 6"  (167.6 cm) Weight: 55.2 kg (121 lb 11.1 oz) IBW/kg (Calculated) : 59.3  Temp (24hrs), Avg:98.8 F (37.1 C), Min:98.6 F (37 C), Max:99 F (37.2 C)  Recent Labs  Lab 07/02/2021 1248 07/29/2021 1249 07/16/21 0259 07/17/21 0118 07/18/21 0400  WBC  --  14.2* 11.9* 12.4*  --   CREATININE 1.10* 1.32* 1.40* 1.39* 1.87*    Estimated Creatinine Clearance: 16.4 mL/min (A) (by C-G formula based on SCr of 1.87 mg/dL (H)).    No Known Allergies  Antimicrobials this admission: Ceftriaxone 7/17-7/20  Microbiology results: 7/16 COVID: negative 7/17 Bld cx X 2:  NGTD  Thank you for allowing pharmacy to be a part of this patient's care.  8/17, PharmD, BCPS, [facility] Clinical Pharmacist 07/19/2021 4:53 PM  ADDENDUM Scr has now increased to 3.60 and continues to trend up; CrCl down to 8.5 ml/min; decrease Unasyn to 1.5 gm IV Q 24 hrs; ck Scr with AM labs and adjust as needed

## 2021-07-19 NOTE — Assessment & Plan Note (Addendum)
--  secondary to NSTEMI --treated w/ amiodarone (now stopped given GOC) --cardiology signed off

## 2021-07-19 NOTE — Assessment & Plan Note (Signed)
--  w/ aphasia, right 3rd nerve palsy, right-sided facial droop and tongue deviation, and left-sided hemiparesis and hemianopsia secondary to M1 occlusion, likely cardiogenic etiology from cardiomyopathy and low EF -- Prognosis grim, remains encephalopathic, unable to take p.o. -- Permissive hypertension, no DVT prophylaxis given significant risk for hemorrhagic transformation. -- Significant neurologic recovery doubted per previous medical team.  Expect deficits to include hemiparesis on the left side, visual loss left side, left side neglect, personality change -- Continue supportive care

## 2021-07-19 NOTE — Assessment & Plan Note (Signed)
--  no tx indicated given GOC

## 2021-07-20 DIAGNOSIS — I63511 Cerebral infarction due to unspecified occlusion or stenosis of right middle cerebral artery: Secondary | ICD-10-CM | POA: Diagnosis not present

## 2021-07-20 DIAGNOSIS — I5022 Chronic systolic (congestive) heart failure: Secondary | ICD-10-CM

## 2021-07-20 DIAGNOSIS — J9601 Acute respiratory failure with hypoxia: Secondary | ICD-10-CM | POA: Diagnosis not present

## 2021-07-20 DIAGNOSIS — N179 Acute kidney failure, unspecified: Secondary | ICD-10-CM | POA: Diagnosis not present

## 2021-07-20 DIAGNOSIS — I214 Non-ST elevation (NSTEMI) myocardial infarction: Secondary | ICD-10-CM

## 2021-07-20 DIAGNOSIS — G9341 Metabolic encephalopathy: Secondary | ICD-10-CM

## 2021-07-20 LAB — CREATININE, SERUM
Creatinine, Ser: 4.76 mg/dL — ABNORMAL HIGH (ref 0.44–1.00)
GFR, Estimated: 8 mL/min — ABNORMAL LOW (ref >60)

## 2021-07-20 MED ORDER — WHITE PETROLATUM EX OINT
TOPICAL_OINTMENT | CUTANEOUS | Status: AC
  Filled 2021-07-20: qty 28.35

## 2021-07-21 LAB — CULTURE, BLOOD (ROUTINE X 2)
Culture: NO GROWTH
Culture: NO GROWTH
Special Requests: ADEQUATE
Special Requests: ADEQUATE

## 2021-07-31 NOTE — Death Summary Note (Signed)
DEATH SUMMARY   Patient Details  Name: Latasha Norton MRN: [id number] DOB: 22-Jul-1925  Admission/Discharge Information   Admit Date:  08/07/2021  Date of Death: Date of Death: 08-12-2021  Time of Death: Time of Death: May 26, 1227  Length of Stay: 5  Referring Physician: Patient, No Pcp Per (Inactive)   Reason(s) for Hospitalization  Left-sided weakness, left-sided neglect  Diagnoses  Preliminary cause of death:   Acute right MCA stroke Secondary Diagnoses (including complications and co-morbidities):  Principal Problem:   Acute right MCA stroke (HCC) Active Problems:   Acute metabolic encephalopathy   VT (ventricular tachycardia) (HCC)   Aspiration pneumonia (HCC)   Acute hypoxemic respiratory failure (HCC)   NSTEMI (non-ST elevated myocardial infarction) (HCC)   AKI (acute kidney injury) (HCC)   Chronic systolic CHF (congestive heart failure) (HCC)   Benign essential HTN   CKD (chronic kidney disease), stage III (HCC)   Aortic atherosclerosis (HCC)   Brief Hospital Course (including significant findings, care, treatment, and services provided and events leading to death)  87 year old woman lived independently prior to admission, PMH systolic CHF, hypertension presented with new onset left-sided weakness, left-sided neglect.  CT revealed large right-sided MCA stroke.  Seen by neurology, prognosis felt to be grim, recommendation for palliative care.  Found to have NSTEMI, seen by cardiology, recommendation for conservative management.  Other issues included acute kidney injury, ventricular tachycardia treated with amiodarone, acute hypoxic respiratory failure secondary to sepsis secondary to aspiration pneumonia.  Seen by palliative care, made DNR, partial comfort care, antibiotics and fluids continued.  Prognosis grim. Succumbed to stroke and passed away Aug 13, 2023.  Acute right MCA stroke (HCC) --w/ aphasia, right 3rd nerve palsy, right-sided facial droop and tongue deviation, and left-sided  hemiparesis and hemianopsia secondary to M1 occlusion, likely cardiogenic etiology from cardiomyopathy and low EF   VT (ventricular tachycardia) (HCC) --secondary to NSTEMI --treated w/ amiodarone (stopped given GOC) --cardiology signed off   Acute metabolic encephalopathy --secondary to stroke   AKI (acute kidney injury) (HCC) --secondary to multiple acute issues, complicated by inability to take PO   NSTEMI (non-ST elevated myocardial infarction) (HCC) --seen by cardiology --not an interventional candidate given stroke and GOC   Acute hypoxemic respiratory failure (HCC) --secondary to aspiration pneumonia -  Aspiration pneumonia (HCC) --w/ sepsis and hypoxia, present on admission, secondary to stroke   Chronic systolic CHF (congestive heart failure) (HCC) --appeared euvolemic --supportive care based on GOC   Aortic atherosclerosis (HCC) --no tx indicated given GOC   Benign essential HTN --supportive care  Pertinent Labs and Studies  Significant Diagnostic Studies MR ANGIO HEAD WO CONTRAST  Result Date: 2021-08-07 CLINICAL DATA:  Initial evaluation for acute stroke. EXAM: MRI HEAD WITHOUT CONTRAST MRA HEAD WITHOUT CONTRAST MRA NECK WITHOUT CONTRAST TECHNIQUE: Multiplanar, multiecho pulse sequences of the brain and surrounding structures were obtained without intravenous contrast. Angiographic images of the Circle of Willis were obtained using MRA technique without intravenous contrast. Angiographic images of the neck were obtained using MRA technique without intravenous contrast. Carotid stenosis measurements (when applicable) are obtained utilizing NASCET criteria, using the distal internal carotid diameter as the denominator. COMPARISON:  Prior CT from earlier the same day. FINDINGS: MRI HEAD FINDINGS Brain: Examination degraded by motion artifact. Generalized age-related cerebral volume loss with mild chronic small vessel ischemic disease. Large confluent area of restricted  diffusion involving the majority of the right MCA distribution is seen, consistent with a large evolving right MCA territory infarct. Involvement of the right caudate and  lentiform nuclei noted. Associated gyral swelling and edema without significant regional mass or midline shift at this time. No associated hemorrhage visible on this motion degraded exam. No other evidence for acute or subacute ischemia. Gray-white matter differentiation otherwise maintained. No encephalomalacia to suggest chronic cortical infarction elsewhere within the brain. No other evidence for acute or chronic intracranial hemorrhage. No mass lesion or midline shift. No hydrocephalus or extra-axial fluid collection. Pituitary gland suprasellar region within normal limits. Midline structures intact. Vascular: Diminished flow void seen within the right MCA distribution. Major intracranial vascular flow voids are otherwise maintained. Skull and upper cervical spine: Craniocervical junction within normal limits. Reversal of the normal cervical lordosis with associated mild diffuse spondylosis noted within the upper cervical spine. Bone marrow signal intensity normal. No scalp soft tissue abnormality. Sinuses/Orbits: Right gaze noted. Globes and orbital soft tissues demonstrate no other acute finding. Paranasal sinuses are largely clear. Trace bilateral mastoid effusions noted, of doubtful significance. Other: None. MRA HEAD FINDINGS ANTERIOR CIRCULATION: Examination degraded by motion. Visualized distal cervical segments of the internal carotid arteries are patent with antegrade flow. Petrous, cavernous, and supraclinoid segments patent without stenosis. A1 segments patent bilaterally. Normal anterior communicating artery complex. Anterior cerebral arteries patent to their distal aspects without stenosis. Left M1 patent. Normal left MCA bifurcation. Distal left MCA branches well perfused. There is acute occlusion of the proximal right M1 segment.  Minimal scant flow within right MCA branches distally. POSTERIOR CIRCULATION: Both vertebral arteries patent to the vertebrobasilar junction without stenosis. Both PICA are patent. Basilar patent to its distal aspect without stenosis. Superior cerebral arteries patent bilaterally. Left PCA supplied via the basilar. Fetal type origin of the right PCA. Both PCAs grossly perfused to their distal aspects without visible stenosis. No visible aneurysm. MRA NECK FINDINGS AORTIC ARCH: Examination technically limited by motion and lack of IV contrast Partially visualized aortic arch within normal limits for caliber with normal branch pattern. No visible stenosis about the origin of the great vessels. RIGHT CAROTID SYSTEM: Right CCA poorly assessed due to motion, but is grossly patent to the bifurcation. No obvious significant stenosis about the right bifurcation. Right ICA grossly patent distally to the skull base without visible stenosis or occlusion. LEFT CAROTID SYSTEM: Left CCA poorly visualized, but is grossly patent to the bifurcation. No obvious stenosis about the left bifurcation. Left ICA grossly patent to the skull base without visible stenosis or occlusion. VERTEBRAL ARTERIES: Both vertebral arteries appear to arise from the subclavian arteries. Vertebral arteries poorly assessed proximally, but are grossly patent to the skull base without visible stenosis or occlusion. IMPRESSION: MRI HEAD IMPRESSION: 1. Large evolving acute ischemic right MCA territory infarct. No associated hemorrhage or significant regional mass effect at this time. 2. Underlying mild age-related cerebral volume loss with chronic small vessel ischemic disease. MRA HEAD IMPRESSION: 1. Motion degraded exam. 2. Acute occlusion of the proximal right M1 segment. 3. No other large vessel occlusion. No other visible hemodynamically significant or correctable stenosis. MRA NECK IMPRESSION: 1. Technically limited exam due to motion and lack of IV  contrast. 2. Gross patency of both carotid artery systems and vertebral arteries to the skull base. No visible stenosis or other vascular abnormality. Electronically Signed   By: Rise Mu M.D.   On: 07/14/2021 20:47   MR ANGIO NECK WO CONTRAST  Result Date: 07/13/2021 CLINICAL DATA:  Initial evaluation for acute stroke. EXAM: MRI HEAD WITHOUT CONTRAST MRA HEAD WITHOUT CONTRAST MRA NECK WITHOUT CONTRAST TECHNIQUE: Multiplanar, multiecho pulse  sequences of the brain and surrounding structures were obtained without intravenous contrast. Angiographic images of the Circle of Willis were obtained using MRA technique without intravenous contrast. Angiographic images of the neck were obtained using MRA technique without intravenous contrast. Carotid stenosis measurements (when applicable) are obtained utilizing NASCET criteria, using the distal internal carotid diameter as the denominator. COMPARISON:  Prior CT from earlier the same day. FINDINGS: MRI HEAD FINDINGS Brain: Examination degraded by motion artifact. Generalized age-related cerebral volume loss with mild chronic small vessel ischemic disease. Large confluent area of restricted diffusion involving the majority of the right MCA distribution is seen, consistent with a large evolving right MCA territory infarct. Involvement of the right caudate and lentiform nuclei noted. Associated gyral swelling and edema without significant regional mass or midline shift at this time. No associated hemorrhage visible on this motion degraded exam. No other evidence for acute or subacute ischemia. Gray-white matter differentiation otherwise maintained. No encephalomalacia to suggest chronic cortical infarction elsewhere within the brain. No other evidence for acute or chronic intracranial hemorrhage. No mass lesion or midline shift. No hydrocephalus or extra-axial fluid collection. Pituitary gland suprasellar region within normal limits. Midline structures intact.  Vascular: Diminished flow void seen within the right MCA distribution. Major intracranial vascular flow voids are otherwise maintained. Skull and upper cervical spine: Craniocervical junction within normal limits. Reversal of the normal cervical lordosis with associated mild diffuse spondylosis noted within the upper cervical spine. Bone marrow signal intensity normal. No scalp soft tissue abnormality. Sinuses/Orbits: Right gaze noted. Globes and orbital soft tissues demonstrate no other acute finding. Paranasal sinuses are largely clear. Trace bilateral mastoid effusions noted, of doubtful significance. Other: None. MRA HEAD FINDINGS ANTERIOR CIRCULATION: Examination degraded by motion. Visualized distal cervical segments of the internal carotid arteries are patent with antegrade flow. Petrous, cavernous, and supraclinoid segments patent without stenosis. A1 segments patent bilaterally. Normal anterior communicating artery complex. Anterior cerebral arteries patent to their distal aspects without stenosis. Left M1 patent. Normal left MCA bifurcation. Distal left MCA branches well perfused. There is acute occlusion of the proximal right M1 segment. Minimal scant flow within right MCA branches distally. POSTERIOR CIRCULATION: Both vertebral arteries patent to the vertebrobasilar junction without stenosis. Both PICA are patent. Basilar patent to its distal aspect without stenosis. Superior cerebral arteries patent bilaterally. Left PCA supplied via the basilar. Fetal type origin of the right PCA. Both PCAs grossly perfused to their distal aspects without visible stenosis. No visible aneurysm. MRA NECK FINDINGS AORTIC ARCH: Examination technically limited by motion and lack of IV contrast Partially visualized aortic arch within normal limits for caliber with normal branch pattern. No visible stenosis about the origin of the great vessels. RIGHT CAROTID SYSTEM: Right CCA poorly assessed due to motion, but is grossly  patent to the bifurcation. No obvious significant stenosis about the right bifurcation. Right ICA grossly patent distally to the skull base without visible stenosis or occlusion. LEFT CAROTID SYSTEM: Left CCA poorly visualized, but is grossly patent to the bifurcation. No obvious stenosis about the left bifurcation. Left ICA grossly patent to the skull base without visible stenosis or occlusion. VERTEBRAL ARTERIES: Both vertebral arteries appear to arise from the subclavian arteries. Vertebral arteries poorly assessed proximally, but are grossly patent to the skull base without visible stenosis or occlusion. IMPRESSION: MRI HEAD IMPRESSION: 1. Large evolving acute ischemic right MCA territory infarct. No associated hemorrhage or significant regional mass effect at this time. 2. Underlying mild age-related cerebral volume loss with chronic  small vessel ischemic disease. MRA HEAD IMPRESSION: 1. Motion degraded exam. 2. Acute occlusion of the proximal right M1 segment. 3. No other large vessel occlusion. No other visible hemodynamically significant or correctable stenosis. MRA NECK IMPRESSION: 1. Technically limited exam due to motion and lack of IV contrast. 2. Gross patency of both carotid artery systems and vertebral arteries to the skull base. No visible stenosis or other vascular abnormality. Electronically Signed   By: Rise Mu M.D.   On: 07/16/2021 20:47   MR BRAIN WO CONTRAST  Result Date: 07/08/2021 CLINICAL DATA:  Initial evaluation for acute stroke. EXAM: MRI HEAD WITHOUT CONTRAST MRA HEAD WITHOUT CONTRAST MRA NECK WITHOUT CONTRAST TECHNIQUE: Multiplanar, multiecho pulse sequences of the brain and surrounding structures were obtained without intravenous contrast. Angiographic images of the Circle of Willis were obtained using MRA technique without intravenous contrast. Angiographic images of the neck were obtained using MRA technique without intravenous contrast. Carotid stenosis measurements  (when applicable) are obtained utilizing NASCET criteria, using the distal internal carotid diameter as the denominator. COMPARISON:  Prior CT from earlier the same day. FINDINGS: MRI HEAD FINDINGS Brain: Examination degraded by motion artifact. Generalized age-related cerebral volume loss with mild chronic small vessel ischemic disease. Large confluent area of restricted diffusion involving the majority of the right MCA distribution is seen, consistent with a large evolving right MCA territory infarct. Involvement of the right caudate and lentiform nuclei noted. Associated gyral swelling and edema without significant regional mass or midline shift at this time. No associated hemorrhage visible on this motion degraded exam. No other evidence for acute or subacute ischemia. Gray-white matter differentiation otherwise maintained. No encephalomalacia to suggest chronic cortical infarction elsewhere within the brain. No other evidence for acute or chronic intracranial hemorrhage. No mass lesion or midline shift. No hydrocephalus or extra-axial fluid collection. Pituitary gland suprasellar region within normal limits. Midline structures intact. Vascular: Diminished flow void seen within the right MCA distribution. Major intracranial vascular flow voids are otherwise maintained. Skull and upper cervical spine: Craniocervical junction within normal limits. Reversal of the normal cervical lordosis with associated mild diffuse spondylosis noted within the upper cervical spine. Bone marrow signal intensity normal. No scalp soft tissue abnormality. Sinuses/Orbits: Right gaze noted. Globes and orbital soft tissues demonstrate no other acute finding. Paranasal sinuses are largely clear. Trace bilateral mastoid effusions noted, of doubtful significance. Other: None. MRA HEAD FINDINGS ANTERIOR CIRCULATION: Examination degraded by motion. Visualized distal cervical segments of the internal carotid arteries are patent with antegrade  flow. Petrous, cavernous, and supraclinoid segments patent without stenosis. A1 segments patent bilaterally. Normal anterior communicating artery complex. Anterior cerebral arteries patent to their distal aspects without stenosis. Left M1 patent. Normal left MCA bifurcation. Distal left MCA branches well perfused. There is acute occlusion of the proximal right M1 segment. Minimal scant flow within right MCA branches distally. POSTERIOR CIRCULATION: Both vertebral arteries patent to the vertebrobasilar junction without stenosis. Both PICA are patent. Basilar patent to its distal aspect without stenosis. Superior cerebral arteries patent bilaterally. Left PCA supplied via the basilar. Fetal type origin of the right PCA. Both PCAs grossly perfused to their distal aspects without visible stenosis. No visible aneurysm. MRA NECK FINDINGS AORTIC ARCH: Examination technically limited by motion and lack of IV contrast Partially visualized aortic arch within normal limits for caliber with normal branch pattern. No visible stenosis about the origin of the great vessels. RIGHT CAROTID SYSTEM: Right CCA poorly assessed due to motion, but is grossly patent to the  bifurcation. No obvious significant stenosis about the right bifurcation. Right ICA grossly patent distally to the skull base without visible stenosis or occlusion. LEFT CAROTID SYSTEM: Left CCA poorly visualized, but is grossly patent to the bifurcation. No obvious stenosis about the left bifurcation. Left ICA grossly patent to the skull base without visible stenosis or occlusion. VERTEBRAL ARTERIES: Both vertebral arteries appear to arise from the subclavian arteries. Vertebral arteries poorly assessed proximally, but are grossly patent to the skull base without visible stenosis or occlusion. IMPRESSION: MRI HEAD IMPRESSION: 1. Large evolving acute ischemic right MCA territory infarct. No associated hemorrhage or significant regional mass effect at this time. 2.  Underlying mild age-related cerebral volume loss with chronic small vessel ischemic disease. MRA HEAD IMPRESSION: 1. Motion degraded exam. 2. Acute occlusion of the proximal right M1 segment. 3. No other large vessel occlusion. No other visible hemodynamically significant or correctable stenosis. MRA NECK IMPRESSION: 1. Technically limited exam due to motion and lack of IV contrast. 2. Gross patency of both carotid artery systems and vertebral arteries to the skull base. No visible stenosis or other vascular abnormality. Electronically Signed   By: Rise Mu M.D.   On: 26-Jul-2021 20:47   DG CHEST PORT 1 VIEW  Result Date: 07/16/2021 CLINICAL DATA:  87 year old female with history of shortness of breath. Evaluate for pneumonia. EXAM: PORTABLE CHEST 1 VIEW COMPARISON:  Chest x-ray 07/26/21 FINDINGS: Patchy ill-defined opacities and widespread areas of interstitial prominence are again noted in the lungs bilaterally, most severe in the left upper lobe. Aeration appears slightly improved compared to the recent prior study. Chronic elevation of the left hemidiaphragm. No pneumothorax. No evidence of pulmonary edema. Moderate cardiomegaly. The patient is rotated to the right on today's exam, resulting in distortion of the mediastinal contours and reduced diagnostic sensitivity and specificity for mediastinal pathology. Atherosclerotic calcifications in the thoracic aorta. IMPRESSION: 1. Improving aeration with what appears to be resolving multilobar bilateral pneumonia. 2. Moderate cardiomegaly. 3. Aortic atherosclerosis. Electronically Signed   By: Trudie Reed M.D.   On: 07/16/2021 10:17   DG Chest Port 1 View  Result Date: 07-26-2021 CLINICAL DATA:  Hypoxia. New onset aphasia and left-sided paralysis. EXAM: PORTABLE CHEST 1 VIEW COMPARISON:  One-view chest x-ray 05/06/2020 FINDINGS: Heart is enlarged. Atherosclerotic changes are noted within the aorta. Asymmetric interstitial and airspace  disease is present throughout the left lung. There is some increased opacity at the right base. Chronic elevation of left hemidiaphragm again noted. IMPRESSION: 1. Stable cardiomegaly. 2. Asymmetric interstitial and airspace disease throughout the left lung concerning for pneumonia. This may represent asymmetric edema. 3. Increased opacity at the right base likely represents atelectasis. Electronically Signed   By: Marin Roberts M.D.   On: Jul 26, 2021 14:50   ECHOCARDIOGRAM COMPLETE  Result Date: 07/16/2021    ECHOCARDIOGRAM REPORT   Patient Name:   TIANN SAHA Date of Exam: 07/16/2021 Medical Rec #:  [id number]      Height:       66.0 in Accession #:    [id number]     Weight:       121.7 lb Date of Birth:  [birth year]      BSA:          1.619 m Patient Age:    89 years       BP:           155/85 mmHg Patient Gender: F  HR:           102 bpm. Exam Location:  Inpatient Procedure: 2D Echo Indications:     stroke  History:         Patient has prior history of Echocardiogram examinations, most                  recent 04/07/2020. Sick sinus syndrome, chronic kidney disease;                  Risk Factors:Hypertension.  Sonographer:     Delcie Roch Referring Phys:  [id number] Kara Mead Diagnosing Phys: Orpah Cobb MD  Sonographer Comments: Image acquisition challenging due to uncooperative patient and Image acquisition challenging due to respiratory motion. IMPRESSIONS  1. Left ventricular ejection fraction, by estimation, is 25 to 30%. The left ventricle has severely decreased function. The left ventricle demonstrates global hypokinesis. There is mild concentric left ventricular hypertrophy. Left ventricular diastolic  parameters are consistent with Grade I diastolic dysfunction (impaired relaxation). There is severe hypokinesis of the left ventricular, entire septal wall and inferior wall.  2. Right ventricular systolic function is normal. The right ventricular size is normal. There is  mildly elevated pulmonary artery systolic pressure.  3. Left atrial size was severely dilated.  4. Right atrial size was mildly dilated.  5. The mitral valve is degenerative. Moderate to severe mitral valve regurgitation. Moderate mitral annular calcification.  6. The aortic valve is tricuspid. There is mild calcification of the aortic valve. There is mild thickening of the aortic valve. Aortic valve regurgitation is moderate.  7. There is mild (Grade II) atheroma plaque involving the aortic root and ascending aorta.  8. The inferior vena cava is normal in size with <50% respiratory variability, suggesting right atrial pressure of 8 mmHg. FINDINGS  Left Ventricle: Left ventricular ejection fraction, by estimation, is 25 to 30%. The left ventricle has severely decreased function. The left ventricle demonstrates global hypokinesis. Severe hypokinesis of the left ventricular, entire septal wall and inferior wall. The left ventricular internal cavity size was normal in size. There is mild concentric left ventricular hypertrophy. Left ventricular diastolic parameters are consistent with Grade I diastolic dysfunction (impaired relaxation). Right Ventricle: The right ventricular size is normal. No increase in right ventricular wall thickness. Right ventricular systolic function is normal. There is mildly elevated pulmonary artery systolic pressure. The tricuspid regurgitant velocity is 2.96  m/s, and with an assumed right atrial pressure of 3 mmHg, the estimated right ventricular systolic pressure is 38.0 mmHg. Left Atrium: Left atrial size was severely dilated. Right Atrium: Right atrial size was mildly dilated. Pericardium: There is no evidence of pericardial effusion. Mitral Valve: The mitral valve is degenerative in appearance. There is moderate thickening of the mitral valve leaflet(s). There is moderate calcification of the mitral valve leaflet(s). Moderate mitral annular calcification. Moderate to severe mitral  valve regurgitation. Tricuspid Valve: The tricuspid valve is normal in structure. Tricuspid valve regurgitation is mild. Aortic Valve: The aortic valve is tricuspid. There is mild calcification of the aortic valve. There is mild thickening of the aortic valve. There is mild aortic valve annular calcification. Aortic valve regurgitation is moderate. Aortic regurgitation PHT  measures 283 msec. Pulmonic Valve: The pulmonic valve was normal in structure. Pulmonic valve regurgitation is not visualized. Aorta: The aortic root is normal in size and structure. There is mild (Grade II) atheroma plaque involving the aortic root and ascending aorta. Venous: The inferior vena cava is normal in size with  less than 50% respiratory variability, suggesting right atrial pressure of 8 mmHg. IAS/Shunts: The atrial septum is grossly normal.  LEFT VENTRICLE PLAX 2D LVIDd:         4.70 cm LVIDs:         4.20 cm LV PW:         1.10 cm LV IVS:        1.20 cm LVOT diam:     1.80 cm LV SV:         29 LV SV Index:   18 LVOT Area:     2.54 cm  RIGHT VENTRICLE             IVC RV S prime:     11.10 cm/s  IVC diam: 1.50 cm TAPSE (M-mode): 1.8 cm LEFT ATRIUM             Index       RIGHT ATRIUM           Index LA diam:        4.50 cm 2.78 cm/m  RA Area:     12.80 cm LA Vol (A2C):   85.8 ml 52.99 ml/m RA Volume:   25.50 ml  15.75 ml/m LA Vol (A4C):   61.9 ml 38.23 ml/m LA Biplane Vol: 72.7 ml 44.90 ml/m  AORTIC VALVE LVOT Vmax:   90.00 cm/s LVOT Vmean:  54.500 cm/s LVOT VTI:    0.112 m AI PHT:      283 msec  AORTA Ao Root diam: 2.80 cm MR Peak grad: 106.1 mmHg  TRICUSPID VALVE MR Mean grad: 56.0 mmHg   TR Peak grad:   35.0 mmHg MR Vmax:      515.00 cm/s TR Vmax:        296.00 cm/s MR Vmean:     344.0 cm/s                           SHUNTS                           Systemic VTI:  0.11 m                           Systemic Diam: 1.80 cm Orpah Cobb MD Electronically signed by Orpah Cobb MD Signature Date/Time: 07/16/2021/3:43:21 PM    Final     CT HEAD CODE STROKE WO CONTRAST  Result Date: 2021-08-06 CLINICAL DATA:  Code stroke.  Acute neuro deficit. EXAM: CT HEAD WITHOUT CONTRAST TECHNIQUE: Contiguous axial images were obtained from the base of the skull through the vertex without intravenous contrast. COMPARISON:  CT head 05/06/2020 FINDINGS: Brain: Large territory cytotoxic edema right MCA territory. Edema is present in the right frontal and parietal lobe extending into the insula and right caudate nucleus. Involvement of the anterior limb internal capsule. Ventricle size normal.  No midline shift.  No acute hemorrhage. Vascular: Atherosclerotic calcification in the cavernous carotid bilaterally. No hyperdense vessel in the M1 segment. There are hyperdense vessels in the right sylvian fissure which may be due to thrombosed MCA branches. Skull: Negative Sinuses/Orbits: Paranasal sinuses clear. Bilateral cataract extraction Other: None ASPECTS (Alberta Stroke Program Early CT Score) - Ganglionic level infarction (caudate, lentiform nuclei, internal capsule, insula, M1-M3 cortex): 1 - Supraganglionic infarction (M4-M6 cortex): 0 Total score (0-10 with 10 being normal): 1 IMPRESSION: 1. Large territory acute infarct right MCA territory.  No hemorrhage or midline shift. 2. ASPECTS is 1 3. Code stroke imaging results were communicated on 07/28/2021 at 1:04 pm to provider Bhagat via text page Electronically Signed   By: Marlan Palauharles  Clark M.D.   On: 07/07/2021 13:07   VAS US CAROTID  Result Date: 07/19/2021 Carotid Arterial Duplex Study Patient Name:  Williemae NatterMOLLIE N Proano  Date of Exam:   07/16/2021 Medical Rec #: [id number]       Accession #:    [id number] Date of Birth: Aug 06, 1925       Patient Gender: F Patient Age:   89Y Exam Location:  [facility] Procedure:      VAS US CAROTID Referring Phys: [id number] Garner GavelIVELISSE OLIVENCIA-SIMMONS --------------------------------------------------------------------------------  Indications:       CVA. Limitations         Today's exam was limited due to Patient somnolence, inability                    to reposition, anatomy/tortuosity of vessels. Comparison Study:  07/18/2021 MRA Neck suboptimal visualization of bilateral                    carotids. Performing Technologist: Jean Rosenthalachel Hodge RDMS,RVT  Examination Guidelines: A complete evaluation includes B-mode imaging, spectral Doppler, color Doppler, and power Doppler as needed of all accessible portions of each vessel. Bilateral testing is considered an integral part of a complete examination. Limited examinations for reoccurring indications may be performed as noted.  Right Carotid Findings: +----------+--------+--------+--------+------------------+--------+           PSV cm/sEDV cm/sStenosisPlaque DescriptionComments +----------+--------+--------+--------+------------------+--------+ CCA Prox  45      10                                tortuous +----------+--------+--------+--------+------------------+--------+ CCA Mid                                             tortuous +----------+--------+--------+--------+------------------+--------+ CCA Distal55      8                                          +----------+--------+--------+--------+------------------+--------+ ICA Prox  58      15      1-39%   heterogenous      tortuous +----------+--------+--------+--------+------------------+--------+ ICA Distal31      9                                          +----------+--------+--------+--------+------------------+--------+ ECA       73                                                 +----------+--------+--------+--------+------------------+--------+ +----------+--------+-------+----------------+-------------------+           PSV cm/sEDV cmsDescribe        Arm Pressure (mmHG) +----------+--------+-------+----------------+-------------------+ [id number]             Multiphasic, WNL                     +----------+--------+-------+----------------+-------------------+ +---------+--------+--+--------+-+---------+  VertebralPSV cm/s30EDV cm/s7Antegrade +---------+--------+--+--------+-+---------+  Left Carotid Findings: +----------+--------+--------+--------+------------------+--------+           PSV cm/sEDV cm/sStenosisPlaque DescriptionComments +----------+--------+--------+--------+------------------+--------+ CCA Prox  93      11                                         +----------+--------+--------+--------+------------------+--------+ CCA Distal76      13                                         +----------+--------+--------+--------+------------------+--------+ ICA Prox  59      17      1-39%   heterogenous               +----------+--------+--------+--------+------------------+--------+ ICA Distal47      13                                         +----------+--------+--------+--------+------------------+--------+ ECA       59                                                 +----------+--------+--------+--------+------------------+--------+ +----------+--------+--------+----------------+-------------------+           PSV cm/sEDV cm/sDescribe        Arm Pressure (mmHG) +----------+--------+--------+----------------+-------------------+ [id number]             Multiphasic, WNL                    +----------+--------+--------+----------------+-------------------+ +---------+--------+--+--------+--+---------+ VertebralPSV cm/s59EDV cm/s14Antegrade +---------+--------+--+--------+--+---------+   Summary: Right Carotid: Velocities in the right ICA are consistent with a 1-39% stenosis. Left Carotid: Velocities in the left ICA are consistent with a 1-39% stenosis. Vertebrals:  Bilateral vertebral arteries demonstrate antegrade flow. Subclavians: Normal flow hemodynamics were seen in bilateral subclavian              arteries. *See table(s) above for  measurements and observations.  Electronically signed by Delia Heady MD on 07/19/2021 at 2:08:55 PM.    Final     Microbiology Recent Results (from the past 240 hour(s))  SARS CORONAVIRUS 2 (TAT 6-24 HRS) Nasopharyngeal Nasopharyngeal Swab     Status: None   Collection Time: 07/25/2021  2:03 PM   Specimen: Nasopharyngeal Swab  Result Value Ref Range Status   SARS Coronavirus 2 NEGATIVE NEGATIVE Final    Comment: (NOTE) SARS-CoV-2 target nucleic acids are NOT DETECTED.  The SARS-CoV-2 RNA is generally detectable in upper and lower respiratory specimens during the acute phase of infection. Negative results do not preclude SARS-CoV-2 infection, do not rule out co-infections with other pathogens, and should not be used as the sole basis for treatment or other patient management decisions. Negative results must be combined with clinical observations, patient history, and epidemiological information. The expected result is Negative.  Fact Sheet for Patients: HairSlick.no  Fact Sheet for Healthcare Providers: quierodirigir.com  This test is not yet approved or cleared by the [place] FDA and  has been authorized for detection and/or diagnosis of SARS-CoV-2 by FDA under an Emergency Use Authorization (EUA). This EUA will remain  in  effect (meaning this test can be used) for the duration of the COVID-19 declaration under Se ction 564(b)(1) of the Act, 21 U.S.C. section 360bbb-3(b)(1), unless the authorization is terminated or revoked sooner.  Performed at [facility], [address] [place], [place] [address]   Culture, blood (routine x 2)     Status: None (Preliminary result)   Collection Time: 07/16/21  4:30 PM   Specimen: BLOOD LEFT HAND  Result Value Ref Range Status   Specimen Description BLOOD LEFT HAND  Final   Special Requests   Final    BOTTLES DRAWN AEROBIC AND ANAEROBIC Blood Culture adequate volume    Culture   Final    NO GROWTH 3 DAYS Performed at [facility], [address] [place], [place] [address]    Report Status PENDING  Incomplete  Culture, blood (routine x 2)     Status: None (Preliminary result)   Collection Time: 07/16/21  4:37 PM   Specimen: BLOOD RIGHT ARM  Result Value Ref Range Status   Specimen Description BLOOD RIGHT ARM  Final   Special Requests   Final    BOTTLES DRAWN AEROBIC ONLY Blood Culture adequate volume   Culture   Final    NO GROWTH 3 DAYS Performed at [facility], [address] [place], [place] [address]    Report Status PENDING  Incomplete    Lab Basic Metabolic Panel: Recent Labs  Lab 07/25/2021 1248 07/04/2021 1249 07/16/21 0259 07/17/21 0118 07/18/21 0400 07/19/21 2007 07/12/2021 0704  NA 144 143 143 147* 150*  --   --   K 3.6 3.6 4.4 3.7 5.4*  --   --   CL 112* 108 111 117* 119*  --   --   CO2  --  22 21* 21* 23  --   --   GLUCOSE 162* 173* 136* 106* 108*  --   --   BUN 29* 26* 31* 30* 50*  --   --   CREATININE 1.10* 1.32* 1.40* 1.39* 1.87* 3.60* 4.76*  CALCIUM  --  9.4 9.3 8.9 8.8*  --   --   MG  --   --   --  2.2 2.5*  --   --    Liver Function Tests: Recent Labs  Lab 07/03/2021 1249 07/17/21 0118 07/18/21 0400  AST 55* 114* 91*  ALT 26 32 37  ALKPHOS 47 41 47  BILITOT 0.3 0.6 0.7  PROT 8.5* 7.1 7.7  ALBUMIN 3.2* 2.6* 2.6*    CBC: Recent Labs  Lab 07/17/2021 1248 07/01/2021 1249 07/16/21 0259 07/17/21 0118  WBC  --  14.2* 11.9* 12.4*  NEUTROABS  --  13.4* 10.4*  --   HGB 13.3 12.3 12.5 11.0*  HCT 39.0 39.5 38.7 35.5*  MCV  --  93.2 89.6 91.3  PLT  --  381 386 319   Cardiac Enzymes: Recent Labs  Lab 07/02/2021 2059 07/16/21 0259  CKTOTAL 1,012* 1,118*   Sepsis Labs: Recent Labs  Lab 07/03/2021 1249 07/16/21 0259 07/17/21 0118  WBC 14.2* 11.9* 12.4*      Brendia Sacks 07/09/2021, 2:50 PM

## 2021-07-31 NOTE — Progress Notes (Signed)
Daily Progress Note   Patient Name: Latasha Norton       Date: 07/17/2021 DOB: 05/05/1925  Age: 87 y.o. MRN#: [id number] Attending Physician: Standley Brooking, MD Primary Care Physician: Patient, No Pcp Per (Inactive) Admit Date: 07/16/2021 Length of Stay: 5 days  Reason for Consultation/Follow-up: Establishing goals of care and Terminal Care  HPI/Patient Profile:  87 y.o. female  with past medical history of chronic systolic CHF (EF 17-61% in 2021), CKD stage III, HTN, and hypothyroidism presenting to the emergency department on 07/14/2021 with left-sided weakness and aphasia. Family reports she has a history of non-compliance with her BP medications. Family had a phone conversation with patient the prior evening and patient was speaking normally. The morning of 7/16, family went into her room and found patient on the floor with obvious left-sided weakness and unable to stand or speak. In the ED, Blood pressure significantly elevated 176/66, hypoxia stabilized on 5L. CT head showing large right-sided MCA acute infarct. MRI confirmed large evolving ischemic right MCA territory infarct with no areas of hemorrhage.   On 7/17, patient unfortunately suffered a NSTEMI (troponin greater than 9000). EF now 25-30%.   On 7/18 the patient developed VTach (rate 130s) and was treated with Metoprolol. Rate down to 100-105, apparent p-wave, wide complex with BBB. Her son Latasha Norton was at bedside.  Family meeting completed 7/19 at which point the patient was made comfort care. Family has requested oxygen remain in place.  Current Medications: Scheduled Meds:    stroke: mapping our early stages of recovery book   Does not apply Once   levothyroxine  75 mcg Intravenous Daily   metoprolol tartrate  2.5 mg Intravenous Q6H   sodium chloride flush  3 mL Intravenous Once    Continuous Infusions:  ampicillin-sulbactam (UNASYN) IV     dextrose 5 % and 0.45% NaCl 100 mL/hr at 07/09/2021 0413    PRN  Meds: acetaminophen **OR** acetaminophen (TYLENOL) oral liquid 160 mg/5 mL **OR** acetaminophen, antiseptic oral rinse, glycopyrrolate **OR** glycopyrrolate **OR** glycopyrrolate, haloperidol **OR** haloperidol **OR** haloperidol lactate, HYDROmorphone (DILAUDID) injection, labetalol, nitroGLYCERIN, ondansetron **OR** ondansetron (ZOFRAN) IV, polyvinyl alcohol  Subjective:   Subjective: Chart Reviewed. Updates received. Patient Assessed. Created space and opportunity for patient  and family to explore thoughts and feelings regarding current medical situation.  Today's Discussion: No family at the bedside. Patient appears to be resting comfortably, respiratory rate regular with some "guppy breathing" noted, but appears comfortable and no respiratory distress. Obtunded at this point. Appears to be actively dying.  Review of Systems  Unable to perform ROS: Acuity of condition   Objective:   Vital Signs: BP (!) 85/48 (BP Location: Left Arm)   Pulse (!) 40   Temp 97.7 F (36.5 C) (Oral)   Resp 12   Ht 5\' 6"  (1.676 m)   Wt 55.2 kg   SpO2 98%   BMI 19.64 kg/m  SpO2: SpO2: 98 % O2 Device: O2 Device: Nasal Cannula O2 Flow Rate: O2 Flow Rate (L/min): 2 L/min  Physical Exam: Physical Exam Vitals and nursing note reviewed.  Constitutional:      Appearance: She is underweight.     Comments: Obtunded  HENT:     Head: Normocephalic and atraumatic.  Pulmonary:     Effort: No respiratory distress.  Abdominal:     General: Abdomen is flat.     Palpations: Abdomen is soft.  Skin:    General: Skin is warm and dry.    SpO2: SpO2: 98 %  O2 Device:SpO2: 98 % O2 Flow Rate: .O2 Flow Rate (L/min): 2 L/min  IO: Intake/output summary:  Intake/Output Summary (Last 24 hours) at 07-29-21 1135 Last data filed at 07/29/21 0500 Gross per 24 hour  Intake 1194.87 ml  Output --  Net 1194.87 ml    LBM: Last BM Date: 07/16/21 Baseline Weight: Weight: 55.2 kg Most recent weight: Weight: 55.2 kg    Palliative Assessment/Data: 10%   Assessment & Plan:   Impression:  Very sick 87 year old female status post large right MCA CVA, subsequent MI, acute on chronic heart failure now with EF 25 to 30%, status post V. tach (with pulse) yesterday, worsening kidney function.  No known advanced directives, the patient is not married but has 3 children which are shared decision makers.  Initially the decision was made for full scope, full code.  However, with the cardiac arrhythmias two days ago a family meeting was held and the patient was made comfort care. Her son is still hopeful for some improvement and we agreed to leave oxygen, IVF, antibiotics in place per his request. Patient appears to be actively dying at this point.  SUMMARY OF RECOMMENDATIONS   Comfort measures however continuing fluids and antibiotics at this point Liberalize visitation Stop lab draws Stop unnecessary medications, tele monitoring Monitor her progress  Code Status: DNR  Goals of Care/Recommendations: Comfort  Symptom Management: Haldol prn anxiety Robinul prn secretions dilaudid 0.5 mg q2 hours prn pain or dyspnea Further recommendations as needed  Prognosis: Hours - Days  Discharge Planning: Anticipated Hospital Death  Thank you for allowing [place] to participate in the care of Latasha Norton PMT will continue to support holistically.  Time Total: 25 min  Visit consisted of counseling and education dealing with the complex and emotionally intense issues of symptom management and palliative care in the setting of serious and potentially life-threatening illness. Greater than 50%  of this time was spent counseling and coordinating care related to the above assessment and plan.  Wynne Dust, NP Palliative Medicine Team  Team Phone # [phone] (Nights/Weekends)  2021/07/29, 11:35 AM

## 2021-07-31 DEATH — deceased

## 2023-01-18 IMAGING — MR MR HEAD W/O CM
5 of 10 series · 27 of 48 positions shown · non-contrast
Comparison: Prior CT from earlier the same day.

CLINICAL DATA: Initial evaluation for acute stroke.



[Series 4: DWI · axial · 3.0mm · 0.94mm/px · z∈[-62,+67]mm · 11 of 94 slices shown (1 of 2)]
[im 1/94]
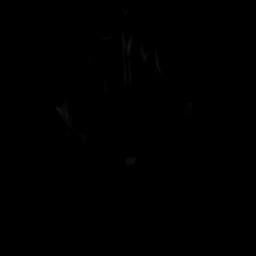
[im 10/94]
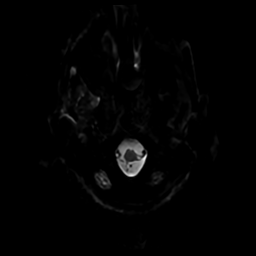
[im 19/94]
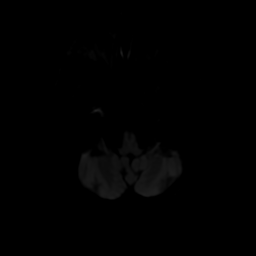
[im 28/94]
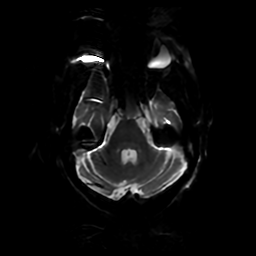
[im 38/94]
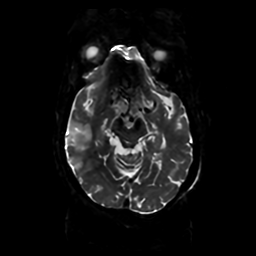
[im 47/94]
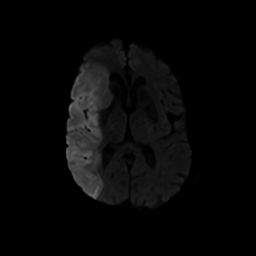
[im 56/94]
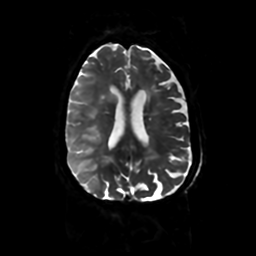
[im 66/94]
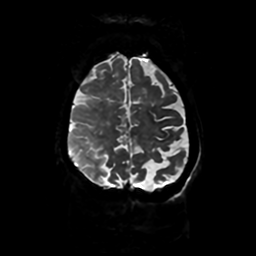
[im 75/94]
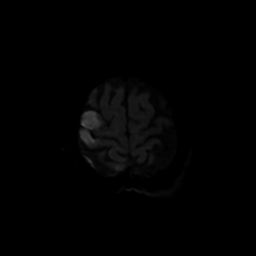
[im 84/94]
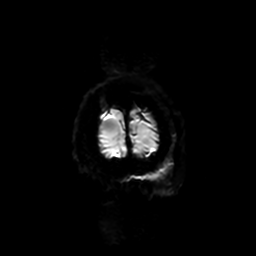
[im 94/94]
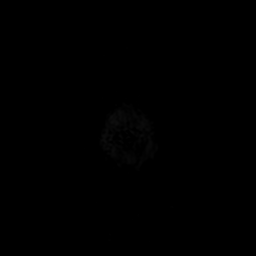

[Series 5: DWI · coronal · 4.0mm · 0.94mm/px · 7 of 73 slices shown (2 of 2)]
[im 1/73]
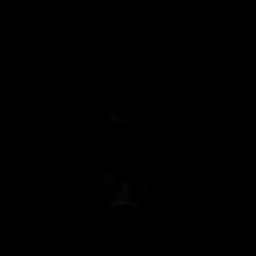
[im 13/73]
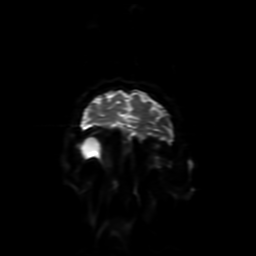
[im 25/73]
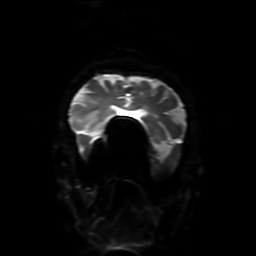
[im 37/73]
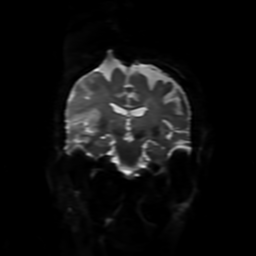
[im 49/73]
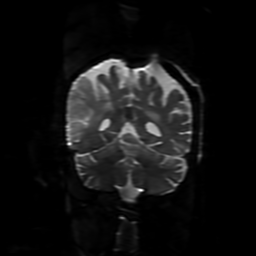
[im 61/73]
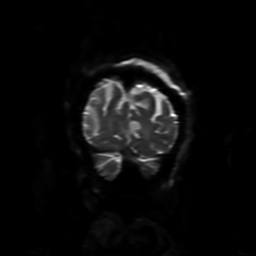
[im 73/73]
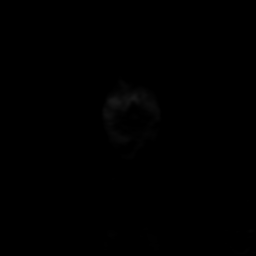

[Series 6: FLAIR · sagittal · 5.0mm · 0.23mm/px · 2 of 23 slices shown (1 of 2)]
[im 1/23]
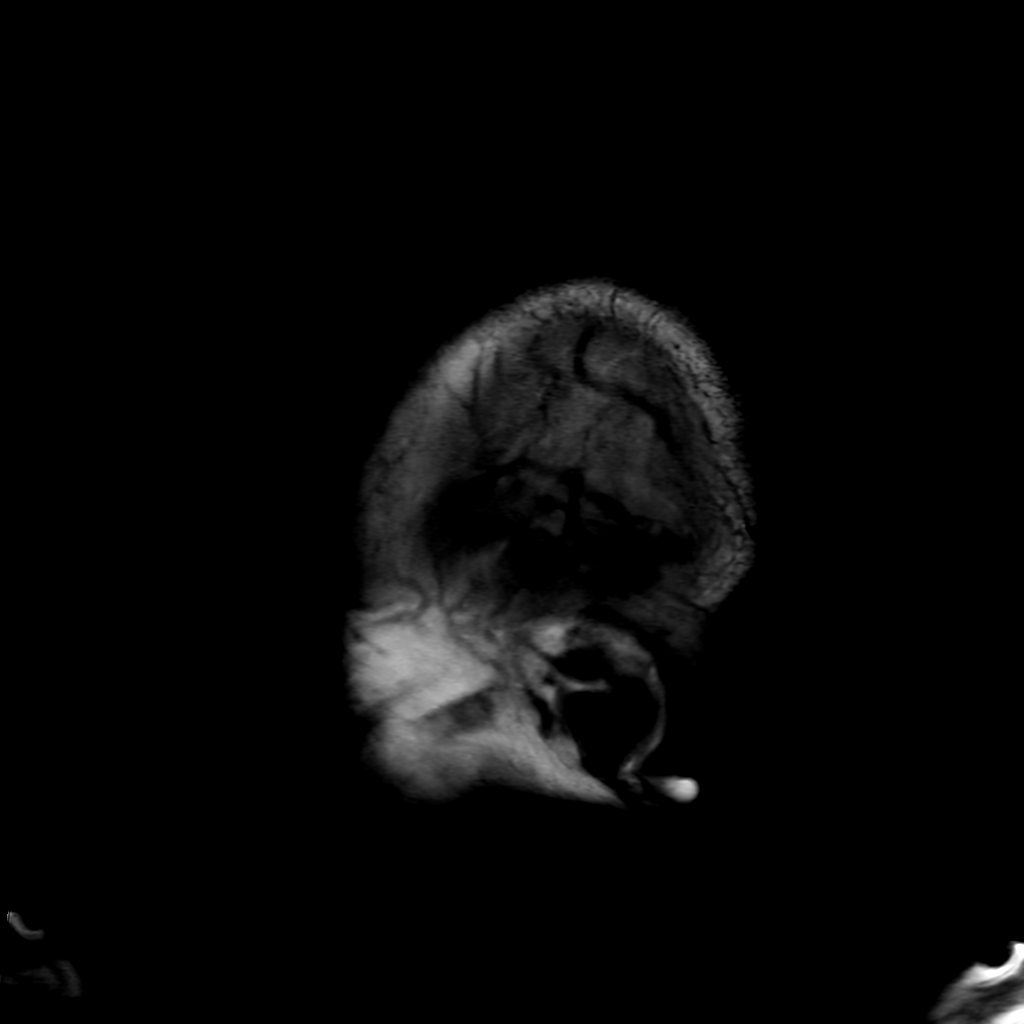
[im 23/23]
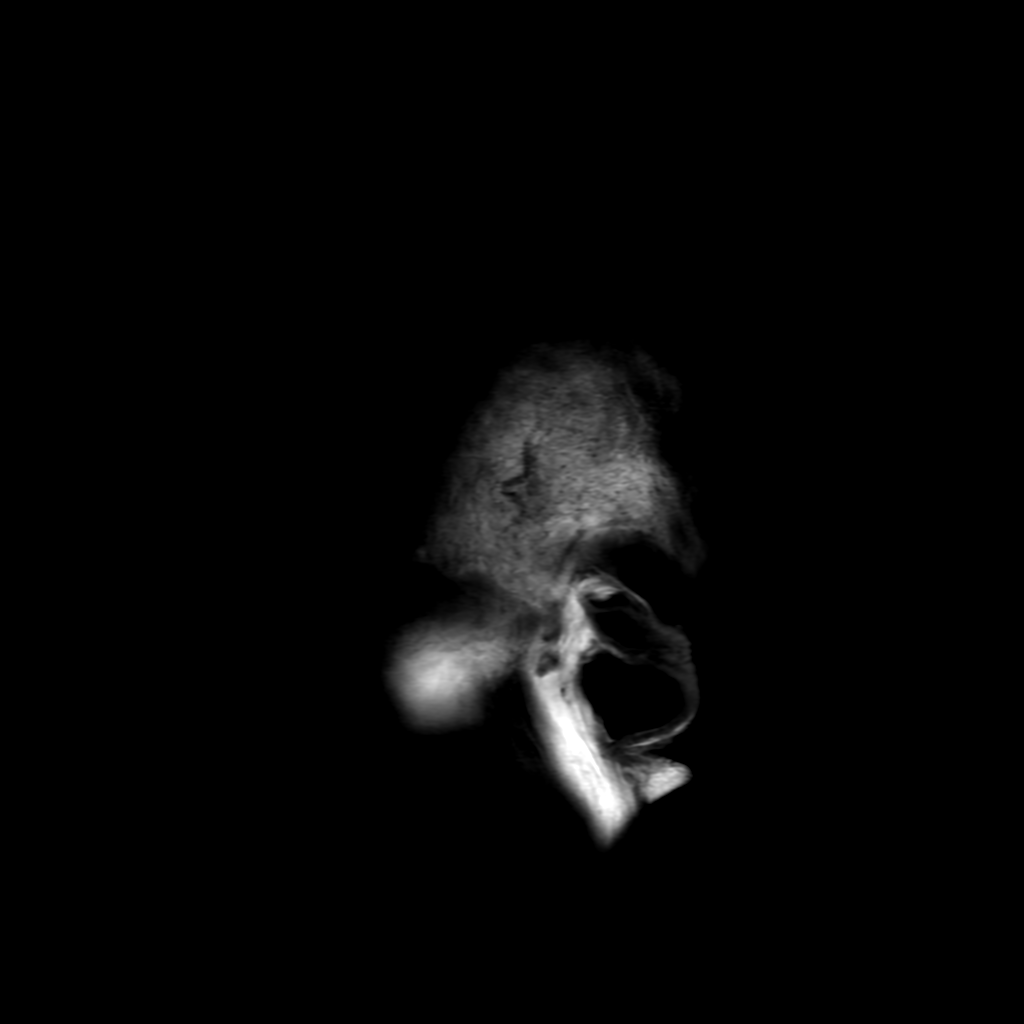

[Series 10: FLAIR · axial · 5.0mm · 0.47mm/px · z∈[-62,+67]mm · 2 of 24 slices shown (2 of 2)]
[im 1/24]
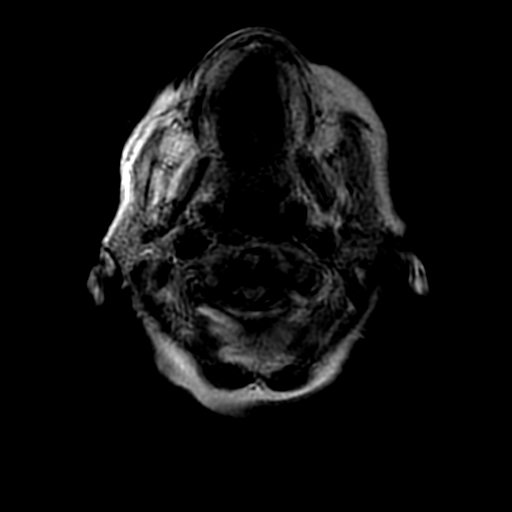
[im 24/24]
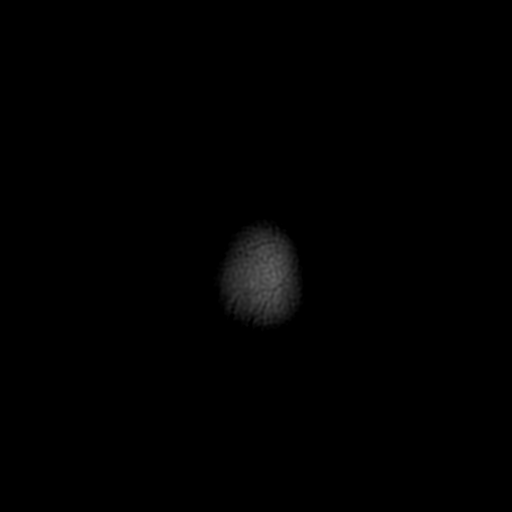

[Series 450: ADC · axial · 3.0mm · 0.94mm/px · z∈[-62,+67]mm · 5 of 47 slices shown]
[im 1/47]
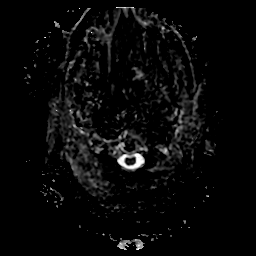
[im 12/47]
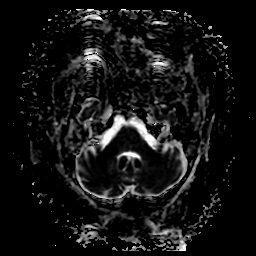
[im 24/47]
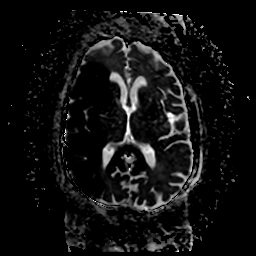
[im 35/47]
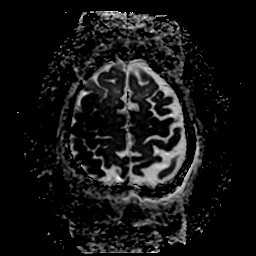
[im 47/47]
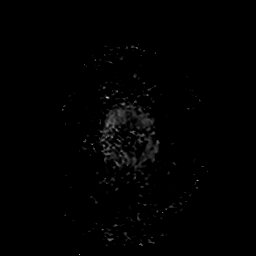

[27 of 48 positions shown; findings below may reference images not displayed]

FINDINGS: MRI HEAD FINDINGS

Brain: Examination degraded by motion artifact.

Generalized age-related cerebral volume loss with mild chronic small
vessel ischemic disease. Large confluent area of restricted
diffusion involving the majority of the right MCA distribution is
seen, consistent with a large evolving right MCA territory infarct.
Involvement of the right caudate and lentiform nuclei noted.
Associated gyral swelling and edema without significant regional
mass or midline shift at this time. No associated hemorrhage visible
on this motion degraded exam.

No other evidence for acute or subacute ischemia. Gray-white matter
differentiation otherwise maintained. No encephalomalacia to suggest
chronic cortical infarction elsewhere within the brain. No other
evidence for acute or chronic intracranial hemorrhage.

No mass lesion or midline shift. No hydrocephalus or extra-axial
fluid collection. Pituitary gland suprasellar region within normal
limits. Midline structures intact.

Vascular: Diminished flow void seen within the right MCA
distribution. Major intracranial vascular flow voids are otherwise
maintained.

Skull and upper cervical spine: Craniocervical junction within
normal limits. Reversal of the normal cervical lordosis with
associated mild diffuse spondylosis noted within the upper cervical
spine. Bone marrow signal intensity normal. No scalp soft tissue
abnormality.

Sinuses/Orbits: Right gaze noted. Globes and orbital soft tissues
demonstrate no other acute finding. Paranasal sinuses are largely
clear. Trace bilateral mastoid effusions noted, of doubtful
significance.

Other: None.

MRA HEAD FINDINGS

ANTERIOR CIRCULATION:

Examination degraded by motion.

Visualized distal cervical segments of the internal carotid arteries
are patent with antegrade flow. Petrous, cavernous, and supraclinoid
segments patent without stenosis. A1 segments patent bilaterally.
Normal anterior communicating artery complex. Anterior cerebral
arteries patent to their distal aspects without stenosis.

Left M1 patent. Normal left MCA bifurcation. Distal left MCA
branches well perfused.

There is acute occlusion of the proximal right M1 segment. Minimal
scant flow within right MCA branches distally.

POSTERIOR CIRCULATION:

Both vertebral arteries patent to the vertebrobasilar junction
without stenosis. Both PICA are patent. Basilar patent to its distal
aspect without stenosis. Superior cerebral arteries patent
bilaterally. Left PCA supplied via the basilar. Fetal type origin of
the right PCA. Both PCAs grossly perfused to their distal aspects
without visible stenosis.

No visible aneurysm.

MRA NECK FINDINGS

AORTIC ARCH: Examination technically limited by motion and lack of
IV contrast

Partially visualized aortic arch within normal limits for caliber
with normal branch pattern. No visible stenosis about the origin of
the great vessels.

RIGHT CAROTID SYSTEM: Right CCA poorly assessed due to motion, but
is grossly patent to the bifurcation. No obvious significant
stenosis about the right bifurcation. Right ICA grossly patent
distally to the skull base without visible stenosis or occlusion.

LEFT CAROTID SYSTEM: Left CCA poorly visualized, but is grossly
patent to the bifurcation. No obvious stenosis about the left
bifurcation. Left ICA grossly patent to the skull base without
visible stenosis or occlusion.

VERTEBRAL ARTERIES: Both vertebral arteries appear to arise from the
subclavian arteries. Vertebral arteries poorly assessed proximally,
but are grossly patent to the skull base without visible stenosis or
occlusion.
IMPRESSION: MRI HEAD IMPRESSION:

1. Large evolving acute ischemic right MCA territory infarct. No
associated hemorrhage or significant regional mass effect at this
time.
2. Underlying mild age-related cerebral volume loss with chronic
small vessel ischemic disease.

MRA HEAD IMPRESSION:

1. Motion degraded exam.
2. Acute occlusion of the proximal right M1 segment.
3. No other large vessel occlusion. No other visible hemodynamically
significant or correctable stenosis.

MRA NECK IMPRESSION:

1. Technically limited exam due to motion and lack of IV contrast.
2. Gross patency of both carotid artery systems and vertebral
arteries to the skull base. No visible stenosis or other vascular
abnormality.

## 2023-01-19 IMAGING — DX DG CHEST 1V PORT
1 series · 1 of 1 positions shown · non-contrast
Comparison: Chest x-ray 07/15/2021

CLINICAL DATA: [AGE] female with history of shortness of
breath. Evaluate for pneumonia.

EXAM:
PORTABLE CHEST 1 VIEW

[chest ap]
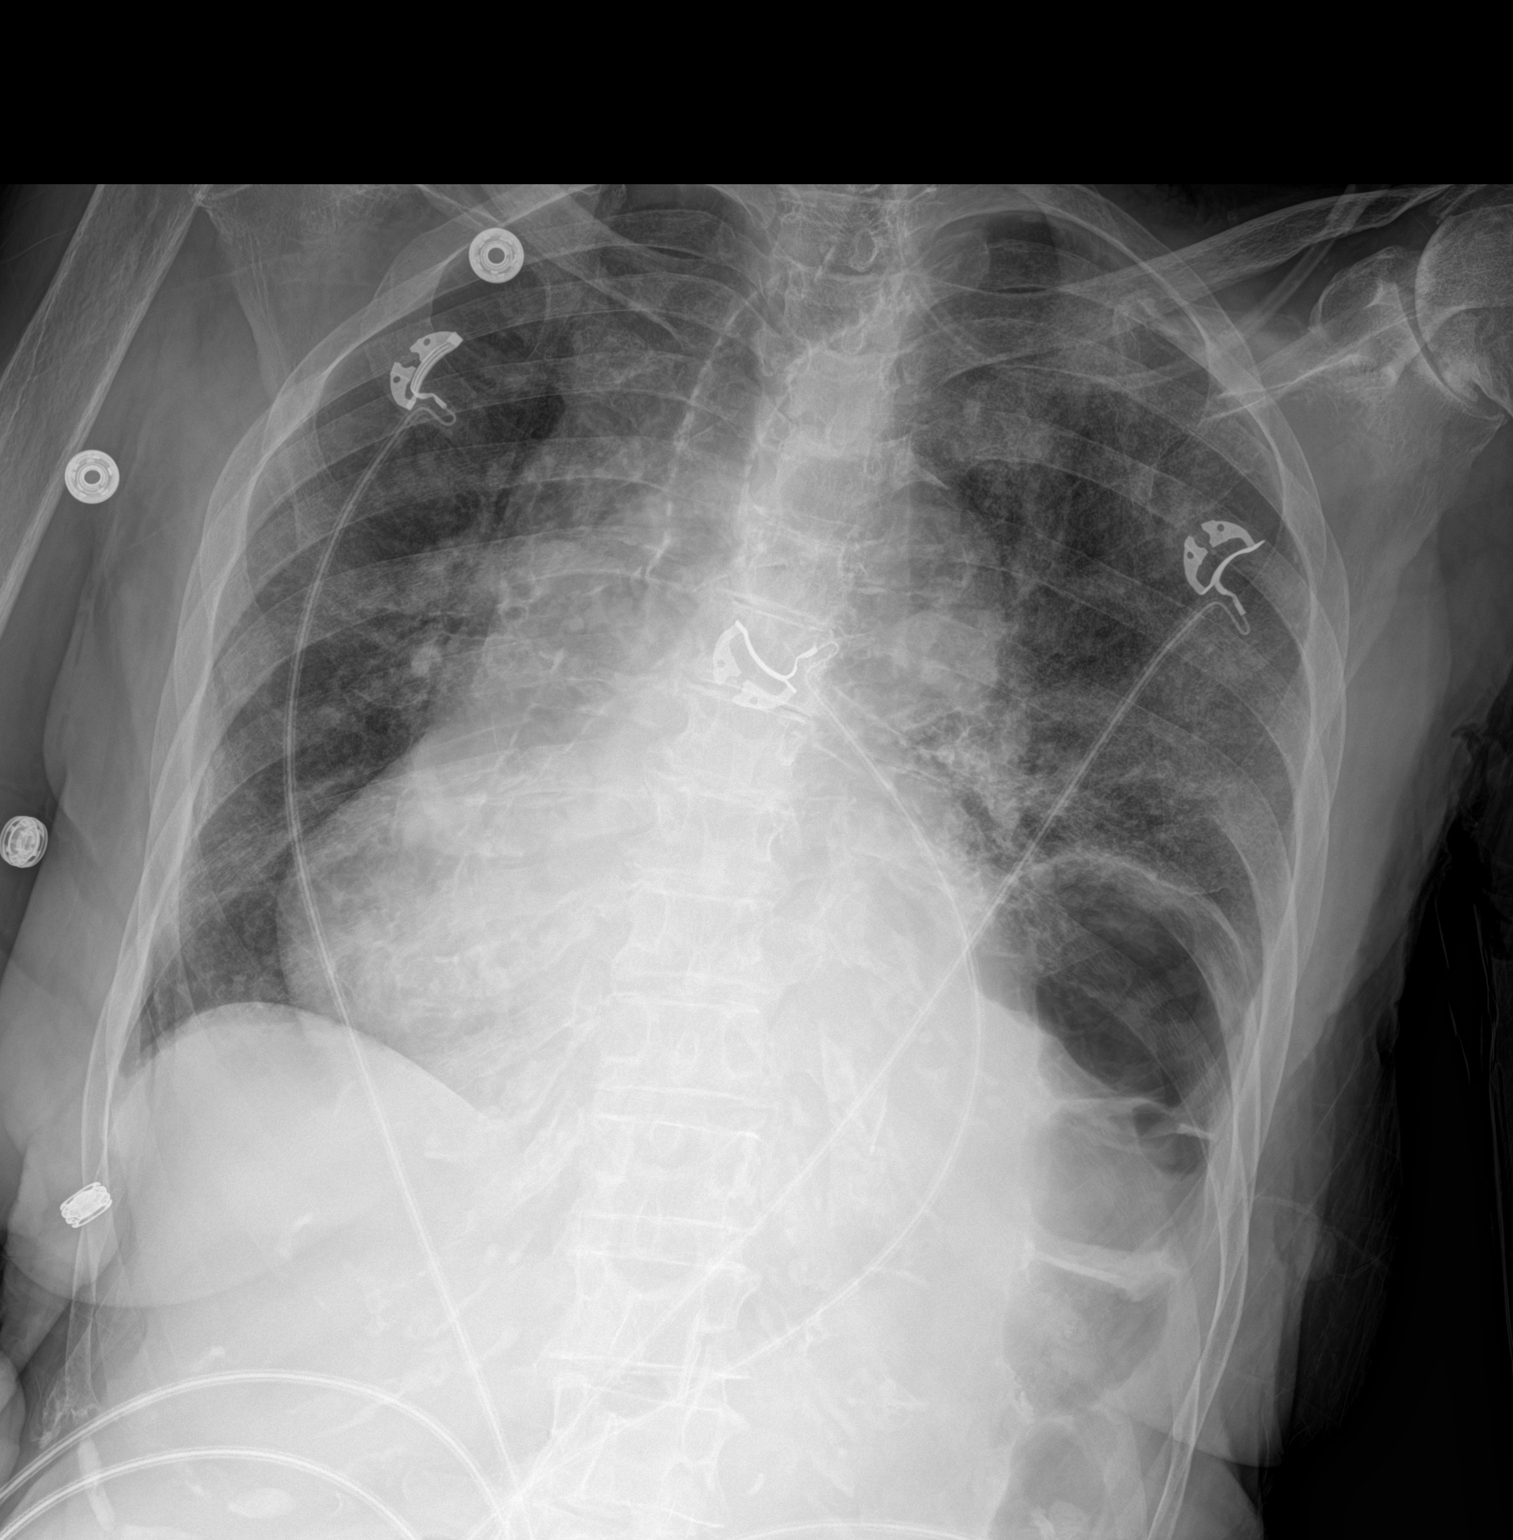

[1 of 1 positions shown; findings below may reference images not displayed]

FINDINGS: Patchy ill-defined opacities and widespread areas of interstitial
prominence are again noted in the lungs bilaterally, most severe in
the left upper lobe. Aeration appears slightly improved compared to
the recent prior study. Chronic elevation of the left hemidiaphragm.
No pneumothorax. No evidence of pulmonary edema. Moderate
cardiomegaly. The patient is rotated to the right on today's exam,
resulting in distortion of the mediastinal contours and reduced
diagnostic sensitivity and specificity for mediastinal pathology.
Atherosclerotic calcifications in the thoracic aorta.
IMPRESSION: 1. Improving aeration with what appears to be resolving multilobar
bilateral pneumonia.
2. Moderate cardiomegaly.
3. Aortic atherosclerosis.
